# Patient Record
Sex: Male | Born: 1985 | Race: White | Hispanic: No | Smoking: Current every day smoker
Health system: Southern US, Community
[De-identification: ages and names within clinical notes are randomized; demographics above are authoritative.]

## PROBLEM LIST (undated history)

## (undated) DIAGNOSIS — F909 Attention-deficit hyperactivity disorder, unspecified type: Secondary | ICD-10-CM

## (undated) DIAGNOSIS — T7840XA Allergy, unspecified, initial encounter: Secondary | ICD-10-CM

## (undated) DIAGNOSIS — E785 Hyperlipidemia, unspecified: Secondary | ICD-10-CM

## (undated) DIAGNOSIS — R7989 Other specified abnormal findings of blood chemistry: Secondary | ICD-10-CM

## (undated) DIAGNOSIS — Z969 Presence of functional implant, unspecified: Secondary | ICD-10-CM

## (undated) DIAGNOSIS — R0989 Other specified symptoms and signs involving the circulatory and respiratory systems: Secondary | ICD-10-CM

## (undated) HISTORY — DX: Hyperlipidemia, unspecified: E78.5

## (undated) HISTORY — DX: Attention-deficit hyperactivity disorder, unspecified type: F90.9

## (undated) HISTORY — DX: Allergy, unspecified, initial encounter: T78.40XA

## (undated) HISTORY — DX: Other specified abnormal findings of blood chemistry: R79.89

---

## 2010-01-17 HISTORY — PX: WISDOM TOOTH EXTRACTION: SHX21

## 2011-09-01 ENCOUNTER — Emergency Department (HOSPITAL_COMMUNITY)
Admission: EM | Admit: 2011-09-01 | Discharge: 2011-09-01 | Disposition: A | Discharge status: Home / Self Care | Payer: Worker's Compensation | Attending: Emergency Medicine | Admitting: Emergency Medicine

## 2011-09-01 ENCOUNTER — Emergency Department (HOSPITAL_COMMUNITY): Payer: Worker's Compensation

## 2011-09-01 ENCOUNTER — Encounter (HOSPITAL_COMMUNITY): Payer: Self-pay | Admitting: Emergency Medicine

## 2011-09-01 DIAGNOSIS — Z23 Encounter for immunization: Secondary | ICD-10-CM | POA: Insufficient documentation

## 2011-09-01 DIAGNOSIS — W268XXA Contact with other sharp object(s), not elsewhere classified, initial encounter: Secondary | ICD-10-CM | POA: Insufficient documentation

## 2011-09-01 DIAGNOSIS — S61209A Unspecified open wound of unspecified finger without damage to nail, initial encounter: Secondary | ICD-10-CM | POA: Insufficient documentation

## 2011-09-01 DIAGNOSIS — F17200 Nicotine dependence, unspecified, uncomplicated: Secondary | ICD-10-CM | POA: Insufficient documentation

## 2011-09-01 DIAGNOSIS — M795 Residual foreign body in soft tissue: Secondary | ICD-10-CM

## 2011-09-01 MED ORDER — TETANUS-DIPHTH-ACELL PERTUSSIS 5-2.5-18.5 LF-MCG/0.5 IM SUSP
0.5000 mL | Freq: Once | INTRAMUSCULAR | Status: AC
  Administered 2011-09-01: 0.5 mL via INTRAMUSCULAR
  Filled 2011-09-01: qty 0.5

## 2011-09-01 MED ORDER — TRAMADOL HCL 50 MG PO TABS: 50.0000 mg | ORAL_TABLET | Freq: Four times a day (QID) | ORAL | Status: DC | PRN

## 2011-09-01 MED ORDER — CEPHALEXIN 250 MG PO CAPS: 250.0000 mg | ORAL_CAPSULE | Freq: Four times a day (QID) | ORAL | Status: AC

## 2011-09-01 NOTE — ED Provider Notes (Signed)
Medical screening examination/treatment/procedure(s) were conducted as a shared visit with non-physician practitioner(s) and myself.  I personally evaluated the patient during the encounter  Eric Hodge is a 26 y.o. male here with possible foreign body on L middle finger while grinding metal. There is a puncture wound but no obvious foreign palpable. Otherwise nl neurovascular exam of the finger. No other injuries. He was given tetanus in the ED. The PA called Dr. Merlyn Lot, hand surgeon, who recommended splint, abx, and patient will call his office tomorrow for follow up.    Richardean Canal, MD 09/01/11 [phone]

## 2011-09-01 NOTE — Progress Notes (Signed)
Orthopedic Tech Progress Note Patient Details:  Eric Hodge Apr 26, 1985 [id number]  Ortho Devices Type of Ortho Device: Finger splint Ortho Device/Splint Location: (L) UE Ortho Device/Splint Interventions: Application   Jennye Moccasin 09/01/2011, 10:27 PM

## 2011-09-01 NOTE — ED Provider Notes (Signed)
History     CSN: [id number]  Arrival date & time 09/01/11  4098   First MD Initiated Contact with Patient 09/01/11 2014      Chief Complaint  Patient presents with  . Finger Injury    (Consider location/radiation/quality/duration/timing/severity/associated sxs/prior treatment) HPI Hx from pt. Arvin Abello is a 26 y.o. male who presents with possible foreign body to L middle finger. States he was grinding metal while at work this evening when a piece flew off and he thought it may have become lodged in his finger. He noted immediate swelling to the area. He has had no bleeding from the area. Denies distal numbness or weakness; has FROM of the finger.   History reviewed. No pertinent past medical history.  History reviewed. No pertinent past surgical history.  No family history on file.  History  Substance Use Topics  . Smoking status: Current Everyday Smoker  . Smokeless tobacco: Not on file  . Alcohol Use: Yes      Review of Systems  Allergies  Review of patient's allergies indicates no known allergies.  Home Medications  No current outpatient prescriptions on file.  BP 150/61  Pulse 64  Temp 98.2 F (36.8 C) (Oral)  Resp 14  SpO2 100%  Physical Exam  Nursing note and vitals reviewed. Constitutional: He appears well-developed and well-nourished. No distress.  HENT:  Head: Normocephalic and atraumatic.  Neck: Normal range of motion.  Cardiovascular: Normal rate.   Pulmonary/Chest: Effort normal.  Musculoskeletal: Normal range of motion.       Small superficial appearing puncture wound to the medial dorsum L middle finger just distal to the PIP jt. No palpable foreign body. No bleeding from the wound. NVI distally, FROM of the finger at all jts. Cap refill <2.  Neurological: He is alert.  Skin: Skin is warm and dry. He is not diaphoretic.  Psychiatric: He has a normal mood and affect.    ED Course  Procedures (including critical care time)  Labs  Reviewed - No data to display Dg Finger Middle Left  09/01/2011  *RADIOLOGY REPORT*  Clinical Data: Blunt trauma  LEFT MIDDLE FINGER 2+V  Comparison: None.  Findings: There is a 3 mm linear metallic foreign body adjacent to the dorsal cortex of the proximal shaft of the middle phalanx. Negative for fracture, dislocation, or other acute bony abnormality.  No significant osseous degenerative change.  IMPRESSION:  1.  Small linear metallic foreign body dorsal to the middle phalanx.  Original Report Authenticated By: Osa Craver, M.D.   I personally reviewed the plain films  No diagnosis found.    MDM  10:07 PM Pt presents with ?FB to finger after grinding metal today. Has small puncture wound to medial dorsum of finger; FB by plain films is somewhere on radial side of dorsal finger. Discussed with Dr. Merlyn Lot on call for hand. States that these sometimes require debridement in the OR for removal. He recs placing pt in splint, on abx, and have him call the office for f/u tomorrow. Pt agreeable with plan.       Grant Fontana, PA-C 09/01/11 2212

## 2011-09-01 NOTE — ED Notes (Signed)
PT. REPORTS "METAL " SPLINTER AT LEFT MIDDLE FINGER SUSTAINED WHILE WORKING AT A WHEEL AT WORK THIS EVENING . NO BLEEDING AT TRIAGE.

## 2011-09-06 ENCOUNTER — Other Ambulatory Visit: Payer: Self-pay | Admitting: Orthopedic Surgery

## 2011-09-06 ENCOUNTER — Encounter (HOSPITAL_BASED_OUTPATIENT_CLINIC_OR_DEPARTMENT_OTHER): Payer: Self-pay | Admitting: *Deleted

## 2011-09-08 ENCOUNTER — Encounter (HOSPITAL_BASED_OUTPATIENT_CLINIC_OR_DEPARTMENT_OTHER): Payer: Self-pay | Admitting: *Deleted

## 2011-09-08 ENCOUNTER — Ambulatory Visit (HOSPITAL_BASED_OUTPATIENT_CLINIC_OR_DEPARTMENT_OTHER): Payer: Worker's Compensation | Admitting: *Deleted

## 2011-09-08 ENCOUNTER — Ambulatory Visit (HOSPITAL_BASED_OUTPATIENT_CLINIC_OR_DEPARTMENT_OTHER)
Admission: RE | Admit: 2011-09-08 | Discharge: 2011-09-08 | Disposition: A | Discharge status: Home / Self Care | Payer: Worker's Compensation | Source: Ambulatory Visit | Attending: Orthopedic Surgery | Admitting: Orthopedic Surgery

## 2011-09-08 ENCOUNTER — Encounter (HOSPITAL_BASED_OUTPATIENT_CLINIC_OR_DEPARTMENT_OTHER)
Admission: RE | Disposition: A | Discharge status: Home / Self Care | Payer: Self-pay | Source: Ambulatory Visit | Attending: Orthopedic Surgery

## 2011-09-08 DIAGNOSIS — Y9269 Other specified industrial and construction area as the place of occurrence of the external cause: Secondary | ICD-10-CM | POA: Insufficient documentation

## 2011-09-08 DIAGNOSIS — Y990 Civilian activity done for income or pay: Secondary | ICD-10-CM | POA: Insufficient documentation

## 2011-09-08 DIAGNOSIS — Z1811 Retained magnetic metal fragments: Secondary | ICD-10-CM | POA: Insufficient documentation

## 2011-09-08 DIAGNOSIS — IMO0002 Reserved for concepts with insufficient information to code with codable children: Secondary | ICD-10-CM | POA: Insufficient documentation

## 2011-09-08 DIAGNOSIS — S61209A Unspecified open wound of unspecified finger without damage to nail, initial encounter: Secondary | ICD-10-CM | POA: Insufficient documentation

## 2011-09-08 HISTORY — PX: FOREIGN BODY REMOVAL: SHX962

## 2011-09-08 SURGERY — REMOVAL FOREIGN BODY EXTREMITY
Anesthesia: General | Site: Finger | Laterality: Left | Wound class: Clean

## 2011-09-08 MED ORDER — LACTATED RINGERS IV SOLN
INTRAVENOUS | Status: DC
  Administered 2011-09-08: 09:00:00 via INTRAVENOUS

## 2011-09-08 MED ORDER — FENTANYL CITRATE 0.05 MG/ML IJ SOLN
INTRAMUSCULAR | Status: DC | PRN
  Administered 2011-09-08: 100 ug via INTRAVENOUS

## 2011-09-08 MED ORDER — DEXAMETHASONE SODIUM PHOSPHATE 10 MG/ML IJ SOLN
INTRAMUSCULAR | Status: DC | PRN
  Administered 2011-09-08: 10 mg via INTRAVENOUS

## 2011-09-08 MED ORDER — CEFAZOLIN SODIUM-DEXTROSE 2-3 GM-% IV SOLR
2.0000 g | Freq: Once | INTRAVENOUS | Status: AC
Start: 2011-09-08 — End: 2011-09-08
  Administered 2011-09-08: 2 g via INTRAVENOUS

## 2011-09-08 MED ORDER — MIDAZOLAM HCL 5 MG/5ML IJ SOLN
INTRAMUSCULAR | Status: DC | PRN
  Administered 2011-09-08: 1 mg via INTRAVENOUS

## 2011-09-08 MED ORDER — BUPIVACAINE HCL (PF) 0.25 % IJ SOLN
INTRAMUSCULAR | Status: DC | PRN
  Administered 2011-09-08: 9 mL

## 2011-09-08 MED ORDER — ONDANSETRON HCL 4 MG/2ML IJ SOLN
INTRAMUSCULAR | Status: DC | PRN
  Administered 2011-09-08: 4 mg via INTRAVENOUS

## 2011-09-08 MED ORDER — PROPOFOL 10 MG/ML IV EMUL
INTRAVENOUS | Status: DC | PRN
  Administered 2011-09-08: 30 mg via INTRAVENOUS
  Administered 2011-09-08: 200 mg via INTRAVENOUS

## 2011-09-08 MED ORDER — HYDROCODONE-ACETAMINOPHEN 5-325 MG PO TABS: ORAL_TABLET | ORAL | Status: DC

## 2011-09-08 MED ORDER — LIDOCAINE HCL (CARDIAC) 20 MG/ML IV SOLN
INTRAVENOUS | Status: DC | PRN
  Administered 2011-09-08: 100 mg via INTRAVENOUS

## 2011-09-08 SURGICAL SUPPLY — 41 items
BANDAGE ELASTIC 3 VELCRO ST LF (GAUZE/BANDAGES/DRESSINGS) IMPLANT
BANDAGE GAUZE ELAST BULKY 4 IN (GAUZE/BANDAGES/DRESSINGS) IMPLANT
BLADE MINI RND TIP GREEN BEAV (BLADE) IMPLANT
BLADE SURG 15 STRL LF DISP TIS (BLADE) ×2 IMPLANT
BLADE SURG 15 STRL SS (BLADE) ×2
BNDG COHESIVE 1X5 TAN STRL LF (GAUZE/BANDAGES/DRESSINGS) ×2 IMPLANT
BNDG ELASTIC 2 VLCR STRL LF (GAUZE/BANDAGES/DRESSINGS) IMPLANT
BNDG ESMARK 4X9 LF (GAUZE/BANDAGES/DRESSINGS) ×2 IMPLANT
CHLORAPREP W/TINT 26ML (MISCELLANEOUS) ×2 IMPLANT
CLOTH BEACON ORANGE TIMEOUT ST (SAFETY) ×2 IMPLANT
CORDS BIPOLAR (ELECTRODE) IMPLANT
COVER MAYO STAND STRL (DRAPES) ×2 IMPLANT
COVER TABLE BACK 60X90 (DRAPES) ×2 IMPLANT
CUFF TOURNIQUET SINGLE 18IN (TOURNIQUET CUFF) ×2 IMPLANT
DRAPE EXTREMITY T 121X128X90 (DRAPE) ×2 IMPLANT
DRAPE SURG 17X23 STRL (DRAPES) ×2 IMPLANT
GAUZE XEROFORM 1X8 LF (GAUZE/BANDAGES/DRESSINGS) ×2 IMPLANT
GLOVE BIO SURGEON STRL SZ 6.5 (GLOVE) ×2 IMPLANT
GLOVE BIO SURGEON STRL SZ7.5 (GLOVE) ×2 IMPLANT
GOWN PREVENTION PLUS XLARGE (GOWN DISPOSABLE) ×2 IMPLANT
GOWN STRL REIN XL XLG (GOWN DISPOSABLE) ×4 IMPLANT
NEEDLE FILTER BLUNT 18X 1/2SAF (NEEDLE)
NEEDLE FILTER BLUNT 18X1 1/2 (NEEDLE) IMPLANT
NEEDLE HYPO 25X1 1.5 SAFETY (NEEDLE) ×2 IMPLANT
NS IRRIG 1000ML POUR BTL (IV SOLUTION) ×2 IMPLANT
PACK BASIN DAY SURGERY FS (CUSTOM PROCEDURE TRAY) ×2 IMPLANT
PAD CAST 3X4 CTTN HI CHSV (CAST SUPPLIES) IMPLANT
PADDING CAST ABS 4INX4YD NS (CAST SUPPLIES) ×1
PADDING CAST ABS COTTON 4X4 ST (CAST SUPPLIES) ×1 IMPLANT
PADDING CAST COTTON 3X4 STRL (CAST SUPPLIES)
SPONGE GAUZE 4X4 12PLY (GAUZE/BANDAGES/DRESSINGS) ×2 IMPLANT
STOCKINETTE 4X48 STRL (DRAPES) ×2 IMPLANT
SUT ETHILON 3 0 PS 1 (SUTURE) IMPLANT
SUT ETHILON 4 0 PS 2 18 (SUTURE) ×2 IMPLANT
SWAB CULTURE LIQ STUART DBL (MISCELLANEOUS) IMPLANT
SYR BULB 3OZ (MISCELLANEOUS) ×2 IMPLANT
SYR CONTROL 10ML LL (SYRINGE) ×2 IMPLANT
TOWEL OR 17X24 6PK STRL BLUE (TOWEL DISPOSABLE) ×4 IMPLANT
TUBE ANAEROBIC SPECIMEN COL (MISCELLANEOUS) IMPLANT
UNDERPAD 30X30 INCONTINENT (UNDERPADS AND DIAPERS) ×2 IMPLANT
WATER STERILE IRR 1000ML POUR (IV SOLUTION) ×2 IMPLANT

## 2011-09-08 NOTE — Transfer of Care (Signed)
Immediate Anesthesia Transfer of Care Note  Patient: Eric Hodge  Procedure(s) Performed: Procedure(s) (LRB): REMOVAL FOREIGN BODY EXTREMITY (Left)  Patient Location: PACU  Anesthesia Type: General  Level of Consciousness: awake, alert  and oriented  Airway & Oxygen Therapy: Patient Spontanous Breathing and Patient connected to face mask oxygen  Post-op Assessment: Report given to PACU RN, Post -op Vital signs reviewed and stable and Patient moving all extremities  Post vital signs: Reviewed and stable  Complications: No apparent anesthesia complications

## 2011-09-08 NOTE — Anesthesia Procedure Notes (Signed)
Procedure Name: LMA Insertion Date/Time: 09/08/2011 11:00 AM Performed by: Meyer Russel Pre-anesthesia Checklist: Patient identified, Emergency Drugs available, Suction available and Patient being monitored Patient Re-evaluated:Patient Re-evaluated prior to inductionOxygen Delivery Method: Circle System Utilized Preoxygenation: Pre-oxygenation with 100% oxygen Intubation Type: IV induction Ventilation: Mask ventilation without difficulty LMA: LMA inserted LMA Size: 5.0 Number of attempts: 1 Airway Equipment and Method: bite block Placement Confirmation: positive ETCO2 and breath sounds checked- equal and bilateral Tube secured with: Tape Dental Injury: Teeth and Oropharynx as per pre-operative assessment

## 2011-09-08 NOTE — H&P (Signed)
  Eric Hodge is an 26 y.o. male.   Chief Complaint: foreign body left long finger HPI: 26 yo rhd male states on 09/01/11 while hitting wheel hub with a hammer a piece of metal shot off into left long finger.  Small wound.  Seen at [facility] where xr revealed radioopaque foreign body.  Reports no previous injury to finger and no other injury at this time.  Past Medical History  Diagnosis Date  . Foreign body of finger of left hand 09/01/2011    left long finger    Past Surgical History  Procedure Date  . Wisdom tooth extraction     History reviewed. No pertinent family history. Social History:  reports that he has been smoking Cigarettes.  He has a 1.5 pack-year smoking history. He has never used smokeless tobacco. He reports that he drinks alcohol. He reports that he does not use illicit drugs.  Allergies: No Known Allergies  Medications Prior to Admission  Medication Sig Dispense Refill  . cephALEXin (KEFLEX) 250 MG capsule Take 1 capsule (250 mg total) by mouth 4 (four) times daily.  28 capsule  0  . traMADol (ULTRAM) 50 MG tablet Take 1 tablet (50 mg total) by mouth every 6 (six) hours as needed for pain.  15 tablet  0    No results found for this or any previous visit (from the past 48 hour(s)).  No results found.   A comprehensive review of systems was negative.  Blood pressure 112/76, pulse 65, temperature 97.4 F (36.3 C), temperature source Oral, resp. rate 16, height 5\' 9"  (1.753 m), weight 110.224 kg (243 lb), SpO2 98.00%.  General appearance: alert, cooperative and appears stated age Head: Normocephalic, without obvious abnormality, atraumatic Neck: supple, symmetrical, trachea midline Resp: clear to auscultation bilaterally Cardio: regular rate and rhythm GI: soft, non-tender; bowel sounds normal; no masses,  no organomegaly Extremities: light touch sesnation and capillary refill intact all digits. +epl/fpl/io.  no swelling, no erythema. Pulses: 2+ and  symmetric Skin: Skin color, texture, turgor normal. No rashes or lesions Neurologic: Grossly normal Incision/Wound: Small healed wound dorsum of long finger  Assessment/Plan Left long finger foreign body.  Discussed non operative and operative treatment options with patient.  He wishes to have the foreign body removed.  Risks, benefits, and alternatives of surgery were discussed and the patient agrees with the plan of care.   Essa Malachi R 09/08/2011, 8:51 AM

## 2011-09-08 NOTE — Anesthesia Preprocedure Evaluation (Addendum)
Anesthesia Evaluation  Patient identified by MRN, date of birth, ID band Patient awake    Reviewed: Allergy & Precautions, H&P , NPO status , Patient's Chart, lab work & pertinent test results  Airway       Dental   Pulmonary Current Smoker,          Cardiovascular negative cardio ROS      Neuro/Psych    GI/Hepatic Neg liver ROS,   Endo/Other  negative endocrine ROS  Renal/GU negative Renal ROS     Musculoskeletal   Abdominal   Peds  Hematology negative hematology ROS (+)   Anesthesia Other Findings   Reproductive/Obstetrics                           Anesthesia Physical Anesthesia Plan  ASA: II  Anesthesia Plan: General LMA   Post-op Pain Management:    Induction:   Airway Management Planned:   Additional Equipment:   Intra-op Plan:   Post-operative Plan:   Informed Consent: I have reviewed the patients History and Physical, chart, labs and discussed the procedure including the risks, benefits and alternatives for the proposed anesthesia with the patient or authorized representative who has indicated his/her understanding and acceptance.   Dental Advisory Given  Plan Discussed with: Anesthesiologist, CRNA and Surgeon  Anesthesia Plan Comments:         Anesthesia Quick Evaluation

## 2011-09-08 NOTE — Anesthesia Postprocedure Evaluation (Signed)
Anesthesia Post Note  Patient: Eric Hodge  Procedure(s) Performed: Procedure(s) (LRB): REMOVAL FOREIGN BODY EXTREMITY (Left)  Anesthesia type: general  Patient location: PACU  Post pain: Pain level controlled  Post assessment: Patient's Cardiovascular Status Stable  Last Vitals:  Filed Vitals:   09/08/11 1200  BP: 106/58  Pulse: 66  Temp:   Resp: 16    Post vital signs: Reviewed and stable  Level of consciousness: sedated  Complications: No apparent anesthesia complications

## 2011-09-08 NOTE — Op Note (Signed)
Dictation [id number]

## 2011-09-09 NOTE — Op Note (Signed)
NAMEKIRIN, PASTORINO             ACCOUNT NO.:  [id number]  MEDICAL RECORD NO.:  [id number]  LOCATION:                                 FACILITY:  PHYSICIAN:  Betha Loa, MD        DATE OF BIRTH:  11-21-1985  DATE OF PROCEDURE:  09/08/2011 DATE OF DISCHARGE:                              OPERATIVE REPORT   PREOPERATIVE DIAGNOSIS:  Left long finger foreign body.  POSTOPERATIVE DIAGNOSIS:  Left long finger foreign body.  PROCEDURE:  Removal of foreign body, left long finger.  SURGEON:  Betha Loa, MD  ASSISTANT:  None.  ANESTHESIA:  General.  IV FLUIDS:  Per anesthesia flow sheet.  ESTIMATED BLOOD LOSS:  Minimal.  COMPLICATIONS:  None.  SPECIMENS:  Metallic foreign body, returned to patient.  TOURNIQUET TIME:  Twelve minutes.  DISPOSITION:  Stable to PACU.  INDICATIONS:  Mr. Chiarelli is a 26 year old male who states he was at work when he was sitting in a wheel hub with a hammer and a piece of metal shot off into his left long finger.  Sent to emergency department where radiographs revealed a metallic foreign body.  He was placed on antibiotics and followed up with me in the office.  He wished to have the foreign body removed.  Risks, benefits, and alternatives of surgery were discussed including risk of blood loss, infection, damage to nerves, vessels, tendons, ligaments, bone; failure of procedure, need for additional procedures, complications, wound healing, continued pain, and retained foreign body.  He voiced understanding of these risks and elected to proceed.  OPERATIVE COURSE:  After being identified preoperatively by myself, the patient and I agreed upon procedure and site procedure.  Surgical site was marked.  The risks, benefits, alternatives of surgery were reviewed and wished to proceed.  Surgical consent had been signed.  He was given 2 g of IV Ancef as preoperative antibiotic prophylaxis.  He was transferred to the operating room placed on the  operating room table in supine position with left upper extremity on arm board.  General anesthesia was induced by the anesthesiologist.  Left upper extremity was prepped and draped in normal sterile orthopedic fashion.  Surgical pause was performed between surgeons, anesthesia, and operating staff, and all were in agreement as to the patient, procedure and site procedure.  Tourniquet at the proximal aspect of the extremities inflated to 250 mmHg after exsanguination of the limb with Esmarch bandage.  A longitudinal incision was made on the dorsum of the long finger.  This was carried into subcutaneous tissues by spreading technique.  Bipolar electrocautery was used to obtain hemostasis.  The metallic foreign body was identified in the subcutaneous tissues.  It was removed.  There was some granulation tissue surrounding it which was also removed.  The tendon was inspected and was intact.  The wound was copiously irrigated with sterile saline.  AP and lateral view on the fluoroscopy was taken to ensure complete removal of radiopaque foreign body which was the case.  The wound was closed with 4-0 nylon in horizontal mattress fashion.  A digital block was performed with 9 mL of 0.25% plain Marcaine.  The wound was then dressed with  sterile Xeroform, 4x4, and wrapped with a Kling and Coban dressing lightly.  Tourniquet was deflated at 12 minutes.  The fingertips were pink with brisk capillary refill after deflation of the tourniquet.  Operative drapes were broken down.  The patient was awoken from anesthesia safely.  He was transferred back to stretcher and taken to PACU in stable condition. I will see him back in the office in 1 week for postoperative followup. I will give him Norco 5/325 one to two p.o. q.6 hours p.r.n. pain, dispensed #20.     Betha Loa, MD     KK/MEDQ  D:  09/08/2011  T:  09/09/2011  Job:  [id number]

## 2011-09-13 ENCOUNTER — Encounter (HOSPITAL_BASED_OUTPATIENT_CLINIC_OR_DEPARTMENT_OTHER): Payer: Self-pay | Admitting: Orthopedic Surgery

## 2012-06-06 ENCOUNTER — Emergency Department (HOSPITAL_COMMUNITY)
Admission: EM | Admit: 2012-06-06 | Discharge: 2012-06-07 | Disposition: A | Discharge status: Home / Self Care | Payer: Worker's Compensation | Attending: Emergency Medicine | Admitting: Emergency Medicine

## 2012-06-06 ENCOUNTER — Encounter (HOSPITAL_COMMUNITY): Payer: Self-pay | Admitting: Emergency Medicine

## 2012-06-06 DIAGNOSIS — F17200 Nicotine dependence, unspecified, uncomplicated: Secondary | ICD-10-CM | POA: Insufficient documentation

## 2012-06-06 DIAGNOSIS — Y9289 Other specified places as the place of occurrence of the external cause: Secondary | ICD-10-CM | POA: Insufficient documentation

## 2012-06-06 DIAGNOSIS — Y9389 Activity, other specified: Secondary | ICD-10-CM | POA: Insufficient documentation

## 2012-06-06 DIAGNOSIS — T148XXA Other injury of unspecified body region, initial encounter: Secondary | ICD-10-CM

## 2012-06-06 DIAGNOSIS — Y990 Civilian activity done for income or pay: Secondary | ICD-10-CM | POA: Insufficient documentation

## 2012-06-06 DIAGNOSIS — IMO0002 Reserved for concepts with insufficient information to code with codable children: Secondary | ICD-10-CM | POA: Insufficient documentation

## 2012-06-06 DIAGNOSIS — X500XXA Overexertion from strenuous movement or load, initial encounter: Secondary | ICD-10-CM | POA: Insufficient documentation

## 2012-06-06 NOTE — ED Notes (Signed)
PT. REPORTS BACK INJURY THIS EVENING WHILE AT WORK ( [employer] [place]) , PT. TRYING TO PUSH A WRENCH TO LOOSEN A BOLT FELT SUDDEN PAIN AT MID/UPPER BACK PAIN RADIATING TO BACK OF NECK .

## 2012-06-07 MED ORDER — DIAZEPAM 5 MG PO TABS
5.0000 mg | ORAL_TABLET | Freq: Once | ORAL | Status: AC
  Administered 2012-06-07: 5 mg via ORAL
  Filled 2012-06-07: qty 1

## 2012-06-07 MED ORDER — DIAZEPAM 5 MG PO TABS: 5.0000 mg | ORAL_TABLET | Freq: Two times a day (BID) | ORAL | Status: DC

## 2012-06-07 MED ORDER — KETOROLAC TROMETHAMINE 60 MG/2ML IM SOLN
60.0000 mg | Freq: Once | INTRAMUSCULAR | Status: AC
  Administered 2012-06-07: 60 mg via INTRAMUSCULAR
  Filled 2012-06-07: qty 2

## 2012-06-07 NOTE — ED Notes (Signed)
Pt dc to home with family. Pt sts understanding to dc instructions.  Pt ambulatory to exit without difficulty.  Pt denies need for w/c.

## 2012-06-07 NOTE — ED Provider Notes (Signed)
Medical screening examination/treatment/procedure(s) were performed by non-physician practitioner and as supervising physician I was immediately available for consultation/collaboration.  Kelita Wallis K Lucky Alverson-Rasch, MD 06/07/12 0133 

## 2012-06-07 NOTE — ED Provider Notes (Signed)
History     CSN: [id number]  Arrival date & time 06/06/12  2250   None     Chief Complaint  Patient presents with  . Back Pain    (Consider location/radiation/quality/duration/timing/severity/associated sxs/prior treatment) HPI History provided by pt.   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  Has had pain that radiates up to his neck ever since.  Pain most prominent w/ certain movements.  Non-pleuritic and no associated SOB, extremity weakness/paresthesias or bowel/bladder dysfunction.  Has not taken anything for pain. No PMH.  Past Medical History  Diagnosis Date  . Foreign body of finger of left hand 09/01/2011    left long finger    Past Surgical History  Procedure Laterality Date  . Wisdom tooth extraction    . Foreign body removal  09/08/2011    Procedure: REMOVAL FOREIGN BODY EXTREMITY;  Surgeon: Tami Ribas, MD;  Location: [facility];  Service: Orthopedics;  Laterality: Left;  FOREIGN BODY REMOVAL LEFT LONG FINGER    No family history on file.  History  Substance Use Topics  . Smoking status: Current Every Day Smoker -- 0.50 packs/day for 3 years    Types: Cigarettes  . Smokeless tobacco: Never Used  . Alcohol Use: Yes     Comment: 4-5 drinks/week      Review of Systems  All other systems reviewed and are negative.    Allergies  Review of patient's allergies indicates no known allergies.  Home Medications   Current Outpatient Rx  Name  Route  Sig  Dispense  Refill  . diazepam (VALIUM) 5 MG tablet   Oral   Take 1 tablet (5 mg total) by mouth 2 (two) times daily.   12 tablet   0     BP 156/74  Pulse 87  Temp(Src) 98.7 F (37.1 C) (Oral)  Resp 14  SpO2 97%  Physical Exam  Nursing note and vitals reviewed. Constitutional: He is oriented to person, place, and time. He appears well-developed and well-nourished.  HENT:  Head: Normocephalic and atraumatic.  Eyes:   Normal appearance  Neck: Normal range of motion.  Cardiovascular: Normal rate and regular rhythm.   Pulmonary/Chest: Effort normal and breath sounds normal.  Genitourinary:  No CVA ttp  Musculoskeletal:  Pt points to pain in right upper back.  Mild tenderness at R trap only.  Limited ROM neck d/t pain. Full active ROM and 5/5 strength of all four extremities.  Nml patellar reflexes.  No saddle anesthesia. Distal sensation intact.  2+ radial and DP pulses.  Ambulates w/out diffulty.   Neurological: He is alert and oriented to person, place, and time.  Skin: Skin is warm and dry. No rash noted.  Psychiatric: He has a normal mood and affect. His behavior is normal.    ED Course  Procedures (including critical care time)  Labs Reviewed - No data to display No results found.   1. Muscle strain       MDM  Healthy 26yo M presents w/ upper back pain that started while removing a bolt w/ wrench at work.  Aggravated by movement.  No associated sx.   Afebrile, NAD, no pleuritic pain, no mid-line tenderness, NV all four extremities intact and ambulatory on exam.  Most likely has muscle strain.  Treated w/ 60mg  IM toradol and po valium.  Prescribed valium and recommended NSAID, rest and heat/ice as well.  Referred to NS for persistent sx.  Return precautions discussed.  1:18 AM       Otilio Miu, PA-C 06/07/12 [phone]

## 2013-11-07 ENCOUNTER — Encounter: Payer: Self-pay | Admitting: Family Medicine

## 2013-11-26 ENCOUNTER — Encounter: Payer: Self-pay | Admitting: *Deleted

## 2013-11-26 ENCOUNTER — Ambulatory Visit (INDEPENDENT_AMBULATORY_CARE_PROVIDER_SITE_OTHER): Admitting: Physician Assistant

## 2013-11-26 ENCOUNTER — Encounter: Payer: Self-pay | Admitting: Physician Assistant

## 2013-11-26 VITALS — BP 110/76 | HR 80 | Temp 97.8°F | Resp 18 | Ht 70.0 in | Wt 252.0 lb

## 2013-11-26 DIAGNOSIS — Z0000 Encounter for general adult medical examination without abnormal findings: Secondary | ICD-10-CM

## 2013-11-26 DIAGNOSIS — M545 Low back pain: Secondary | ICD-10-CM

## 2013-11-26 DIAGNOSIS — G8929 Other chronic pain: Secondary | ICD-10-CM

## 2013-11-26 DIAGNOSIS — F909 Attention-deficit hyperactivity disorder, unspecified type: Secondary | ICD-10-CM

## 2013-11-26 DIAGNOSIS — Z720 Tobacco use: Secondary | ICD-10-CM

## 2013-11-26 DIAGNOSIS — M25562 Pain in left knee: Secondary | ICD-10-CM

## 2013-11-26 DIAGNOSIS — F17200 Nicotine dependence, unspecified, uncomplicated: Secondary | ICD-10-CM | POA: Insufficient documentation

## 2013-11-26 DIAGNOSIS — L720 Epidermal cyst: Secondary | ICD-10-CM

## 2013-11-26 DIAGNOSIS — F988 Other specified behavioral and emotional disorders with onset usually occurring in childhood and adolescence: Secondary | ICD-10-CM | POA: Insufficient documentation

## 2013-11-26 DIAGNOSIS — M25512 Pain in left shoulder: Secondary | ICD-10-CM

## 2013-11-26 LAB — CBC WITH DIFFERENTIAL/PLATELET
BASOS PCT: 0 % (ref 0–1)
Basophils Absolute: 0 10*3/uL (ref 0.0–0.1)
EOS ABS: 0.2 10*3/uL (ref 0.0–0.7)
Eosinophils Relative: 2 % (ref 0–5)
HCT: 45.8 % (ref 39.0–52.0)
HEMOGLOBIN: 16.6 g/dL (ref 13.0–17.0)
Lymphocytes Relative: 17 % (ref 12–46)
Lymphs Abs: 2.1 10*3/uL (ref 0.7–4.0)
MCH: 32.8 pg (ref 26.0–34.0)
MCHC: 36.2 g/dL — AB (ref 30.0–36.0)
MCV: 90.5 fL (ref 78.0–100.0)
MONOS PCT: 8 % (ref 3–12)
Monocytes Absolute: 1 10*3/uL (ref 0.1–1.0)
NEUTROS PCT: 73 % (ref 43–77)
Neutro Abs: 8.8 10*3/uL — ABNORMAL HIGH (ref 1.7–7.7)
Platelets: 214 10*3/uL (ref 150–400)
RBC: 5.06 MIL/uL (ref 4.22–5.81)
RDW: 13.5 % (ref 11.5–15.5)
WBC: 12.1 10*3/uL — ABNORMAL HIGH (ref 4.0–10.5)

## 2013-11-26 LAB — LIPID PANEL
CHOL/HDL RATIO: 5.7 ratio
CHOLESTEROL: 221 mg/dL — AB (ref 0–200)
HDL: 39 mg/dL — AB (ref >39)
LDL Cholesterol: 152 mg/dL — ABNORMAL HIGH (ref 0–99)
Triglycerides: 151 mg/dL — ABNORMAL HIGH (ref <150)
VLDL: 30 mg/dL (ref 0–40)

## 2013-11-26 LAB — COMPLETE METABOLIC PANEL WITH GFR
ALK PHOS: 84 U/L (ref 39–117)
ALT: 24 U/L (ref 0–53)
AST: 16 U/L (ref 0–37)
Albumin: 4.4 g/dL (ref 3.5–5.2)
BILIRUBIN TOTAL: 0.6 mg/dL (ref 0.2–1.2)
BUN: 16 mg/dL (ref 6–23)
CO2: 25 mEq/L (ref 19–32)
Calcium: 9.5 mg/dL (ref 8.4–10.5)
Chloride: 104 mEq/L (ref 96–112)
Creat: 1.1 mg/dL (ref 0.50–1.35)
GFR, Est African American: >89 mL/min
GLUCOSE: 96 mg/dL (ref 70–99)
Potassium: 4.3 mEq/L (ref 3.5–5.3)
SODIUM: 138 meq/L (ref 135–145)
TOTAL PROTEIN: 7 g/dL (ref 6.0–8.3)

## 2013-11-26 LAB — TSH: TSH: 2.228 u[IU]/mL (ref 0.350–4.500)

## 2013-11-26 MED ORDER — LISDEXAMFETAMINE DIMESYLATE 60 MG PO CAPS: 60.0000 mg | ORAL_CAPSULE | ORAL | Status: DC

## 2013-11-26 NOTE — Progress Notes (Signed)
Patient ID: Eric Hodge MRN: [id number], DOB: 1985/04/05 28 y.o. Date of Encounter: 11/26/2013, 10:19 AM    Chief Complaint: Physical (CPE)  HPI: 28 y.o. y/o white male here for CPE.  He is also being seen as a new patient to establish care here.  He says that he goes to the [facility] regarding his low back pain, left knee pain, left shoulder pain. He says that they prescribe his current medications which are diclofenac and hydrocodone. Asked if he knows exactly what is wrong with any of those areas of his body but he does not tell no any specific diagnoses. He says that he thinks something is torn in the left shoulder. Says that they wanted to do surgery to the left shoulder and the left knee but has not had the surgery because he cannot take that amount of time out of work. Says that he has had injection to the left shoulder and the left knee with little benefit. Says that he is scheduled to start physical [place] in [place]. Has appointment with them next month.  Otherwise he has never had a PCP and has had no other medical care.  He did want to discuss one thing today and that is possible ADD. 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  Says that he has problems focusing and concentrating. Has hard time completing a task. Instead he will start multiple task without completing them.  Says that he was never diagnosed with this in school and was never treated for ADD. Says that he never really got good grades but does not know whether that was secondary to ADD or not.   No other concerns today.   Review of Systems: Consitutional: No fever, chills, fatigue, night sweats, lymphadenopathy, or weight changes. Eyes: No visual changes, eye redness, or discharge. ENT/Mouth: Ears: No otalgia, tinnitus, hearing loss, discharge. Nose: No congestion, rhinorrhea, sinus pain, or  epistaxis. Throat: No sore throat, post nasal drip, or teeth pain. Cardiovascular: No CP, palpitations, diaphoresis, DOE, edema, orthopnea, PND. Respiratory: No cough, hemoptysis, SOB, or wheezing. Gastrointestinal: No anorexia, dysphagia, reflux, pain, nausea, vomiting, hematemesis, diarrhea, constipation, BRBPR, or melena. Genitourinary: No dysuria, frequency, urgency, hematuria, incontinence, nocturia, decreased urinary stream, discharge, impotence, or testicular pain/masses. Musculoskeletal: No decreased ROM, myalgias, stiffness, joint swelling, or weakness. Skin: No rash, erythema, lesion changes, pain, warmth, jaundice, or pruritis. Neurological: No headache, dizziness, syncope, seizures, tremors, memory loss, coordination problems, or paresthesias. Psychological: No anxiety, depression, hallucinations, SI/HI. Endocrine: No fatigue, polydipsia, polyphagia, polyuria, or known diabetes. All other systems were reviewed and are otherwise negative.  Past Medical History  Diagnosis Date  . Foreign body of finger of left hand 09/01/2011    left long finger     Past Surgical History  Procedure Laterality Date  . Wisdom tooth extraction    . Foreign body removal  09/08/2011    Procedure: REMOVAL FOREIGN BODY EXTREMITY;  Surgeon: Tami RibasKevin R Kuzma, MD;  Location: [facility];  Service: Orthopedics;  Laterality: Left;  FOREIGN BODY REMOVAL LEFT LONG FINGER    Home Meds:  Outpatient Prescriptions Prior to Visit  Medication Sig Dispense Refill  . diclofenac (VOLTAREN) 75 MG EC tablet Take 75 mg by mouth as needed. Knee pain    . HYDROcodone-acetaminophen (NORCO) 10-325 MG per tablet Take 1 tablet by mouth every 6 (six) hours as needed (  for knee pain).    . diazepam (VALIUM) 5 MG tablet Take 1 tablet (5 mg total) by mouth 2 (two) times daily. 12 tablet 0   No facility-administered medications prior to visit.    Allergies: No Known Allergies  History   Social History  . Marital  Status: Legally Separated    Spouse Name: N/A    Number of Children: N/A  . Years of Education: N/A   Occupational History  . Not on file.   Social History Main Topics  . Smoking status: Current Every Day Smoker -- 0.50 packs/day for 7 years    Types: Cigarettes  . Smokeless tobacco: Never Used  . Alcohol Use: 4.8 oz/week    8 Cans of beer per week     Comment: 4-5 drinks/week  . Drug Use: No  . Sexual Activity: Not Currently   Other Topics Concern  . Not on file   Social History Narrative   Works as Nurse, children's.   No routine exercise.   In [employer] PT tests--will exercise prior to these      He has a 2 y/o son---currently pt has him Tuesday-Friday but says 'that may change"   Rest of time, child is with his mom--in [place]    Family History  Problem Relation Age of Onset  . Stroke Paternal Grandmother   . Hypertension Paternal Grandmother   . Cancer Paternal Grandfather   . Hyperlipidemia Paternal Grandfather     Physical Exam: Blood pressure 110/76, pulse 80, temperature 97.8 F (36.6 C), temperature source Oral, resp. rate 18, height 5\' 10"  (1.778 m), weight 252 lb (114.306 kg).  General: Well developed, well nourished, WM. Appears in no acute distress. HEENT: Normocephalic, atraumatic. Conjunctiva pink, sclera non-icteric. Pupils 2 mm constricting to 1 mm, round, regular, and equally reactive to light and accomodation. EOMI. Internal auditory canal clear. TMs with good cone of light and without pathology. Nasal mucosa pink. Nares are without discharge. No sinus tenderness. Oral mucosa pink.  Pharynx without exudate.   Neck: Supple. Trachea midline. No thyromegaly. Full ROM. No lymphadenopathy. Lungs: Clear to auscultation bilaterally without wheezes, rales, or rhonchi. Breathing is of normal effort and unlabored. Cardiovascular: RRR with S1 S2. No murmurs, rubs, or gallops. Distal pulses 2+ symmetrically. No carotid or abdominal  bruits. Abdomen: Soft, non-tender, non-distended with normoactive bowel sounds. No hepatosplenomegaly or masses. No rebound/guarding. No CVA tenderness. No hernias. Musculoskeletal: Full range of motion and 5/5 strength throughout.  Skin: Warm and moist without erythema, ecchymosis, wounds, or rash. Neuro: A+Ox3. CN II-XII grossly intact. Moves all extremities spontaneously. Full sensation throughout. Normal gait.  Psych:  Responds to questions appropriately with a normal affect.   Assessment/Plan:  28 y.o. y/o  white male here for CPE  -1. Visit for preventive health examination  A. Screening Labs:  - CBC with Differential - COMPLETE METABOLIC PANEL WITH GFR - Lipid panel - TSH - Vit D  25 hydroxy (rtn osteoporosis monitoring)  B. Screening For Prostate Cancer: No indication to start this prior to age 30.  C. Screening For Colorectal Cancer:  No indication to start this prior to age 78.  D. Immunizations: Flu------ he says that he gets lots of shots from the [employer] and says that he knows he even got to flu shots this year by them!! Eustaquio Boyden says he knows he gets lots of shots from the [employer]. Does not know specifically about the last tetanus. Told him to try  [place] get [place] [facility] so that we can document the dates in epic. Pneumococcal---given that he is a smoker he needs Pneumovax 23.-----[place] get [place] [facility] then will immunize accordingly if needed. Zostavax--not indicated until age 28  2. ADD (attention deficit disorder) - lisdexamfetamine (VYVANSE) 60 MG capsule; Take 1 capsule (60 mg total) by mouth every morning.  Dispense: 30 capsule; Refill: 0 Discussed drug holidays. Discussed possible adverse effects from the medication. Follow-up with me immediately if he is having adverse effects. Otherwise he is to schedule follow-up office visit with me one month from now to follow this up.  3. Smoker I discussed smoking cessation with him today. He  says that he has not given us any thought. Discussed that there are prescription medications, which I can prescribe, to help him to stop smoking.   4. [facility]  5. Chronic knee pain, left Per [facility]  6. Chronic left shoulder pain Per [facility]  Follow-up office visit in one month or sooner if needed.   Signed:   [address]eria Goetsch Beth [place], [place]  11/26/2013 10:19 AM

## 2013-11-27 LAB — VITAMIN D 25 HYDROXY (VIT D DEFICIENCY, FRACTURES): Vit D, 25-Hydroxy: 29 ng/mL — ABNORMAL LOW (ref 30–89)

## 2013-12-09 IMAGING — CR DG FINGER MIDDLE 2+V*L*
3 series · 3 of 3 positions shown · non-contrast
Comparison: None.

CLINICAL DATA: Blunt trauma

LEFT MIDDLE FINGER 2+V

[x finger pa left]
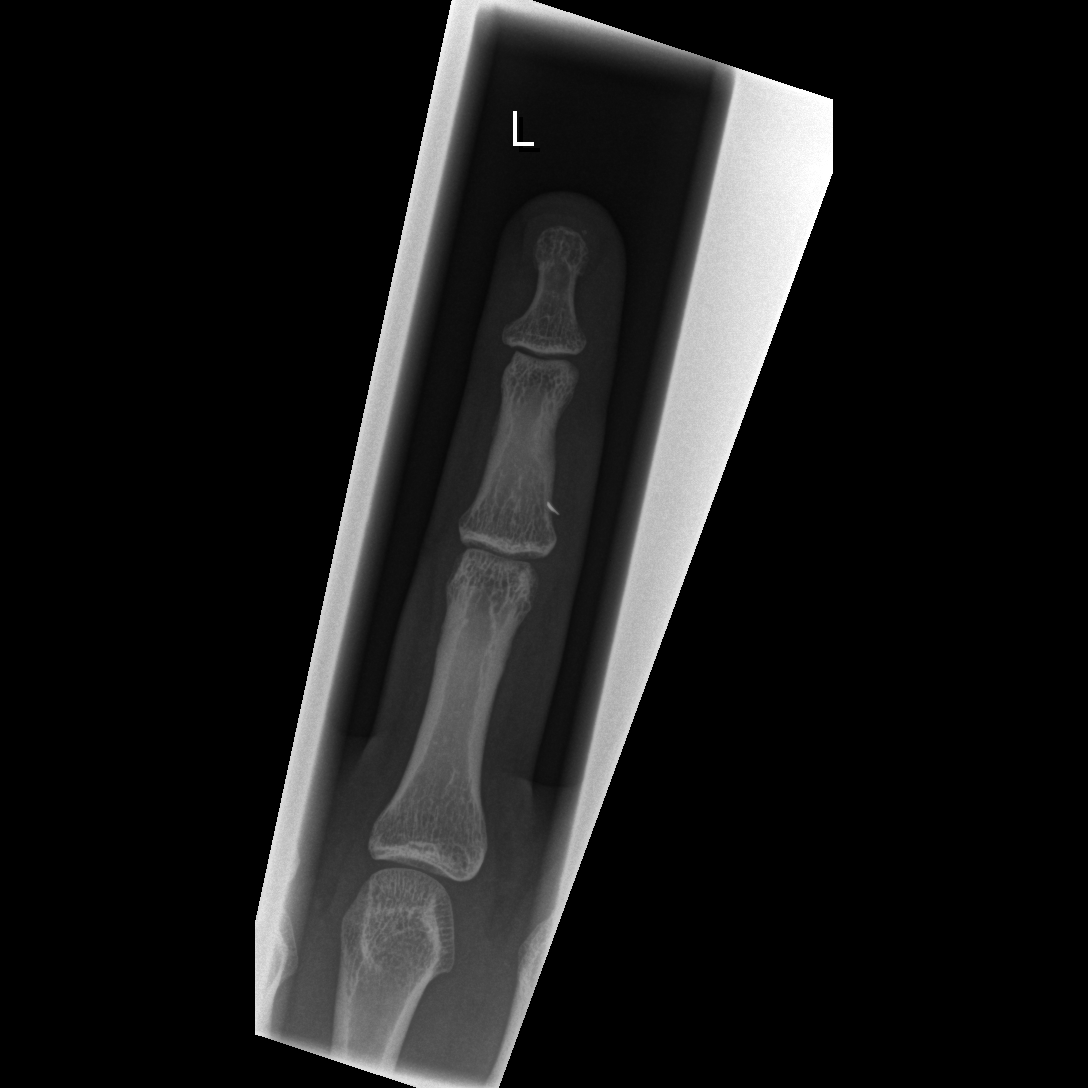

[x finger obl. left]
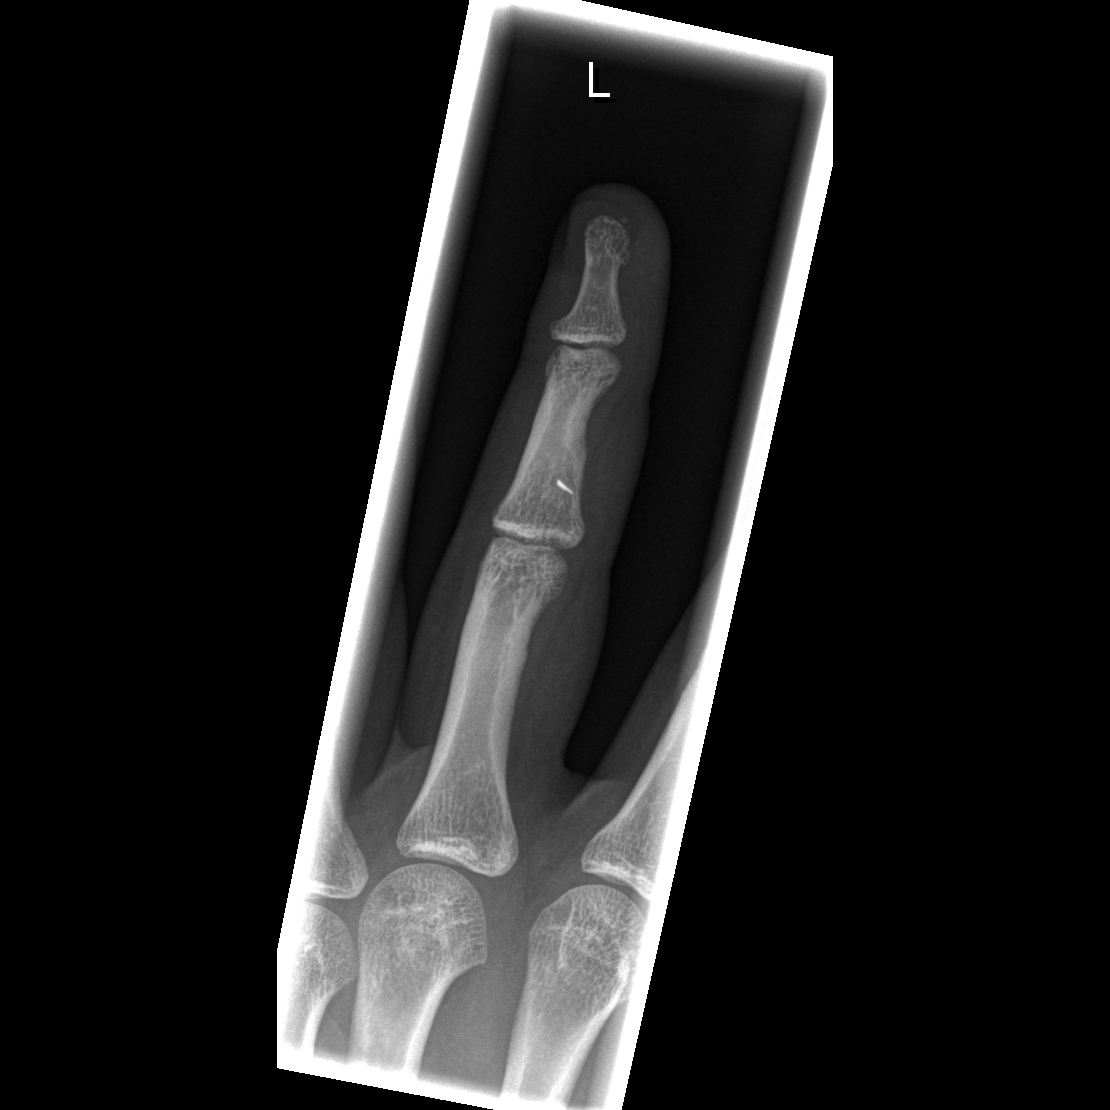

[x finger lateral left]
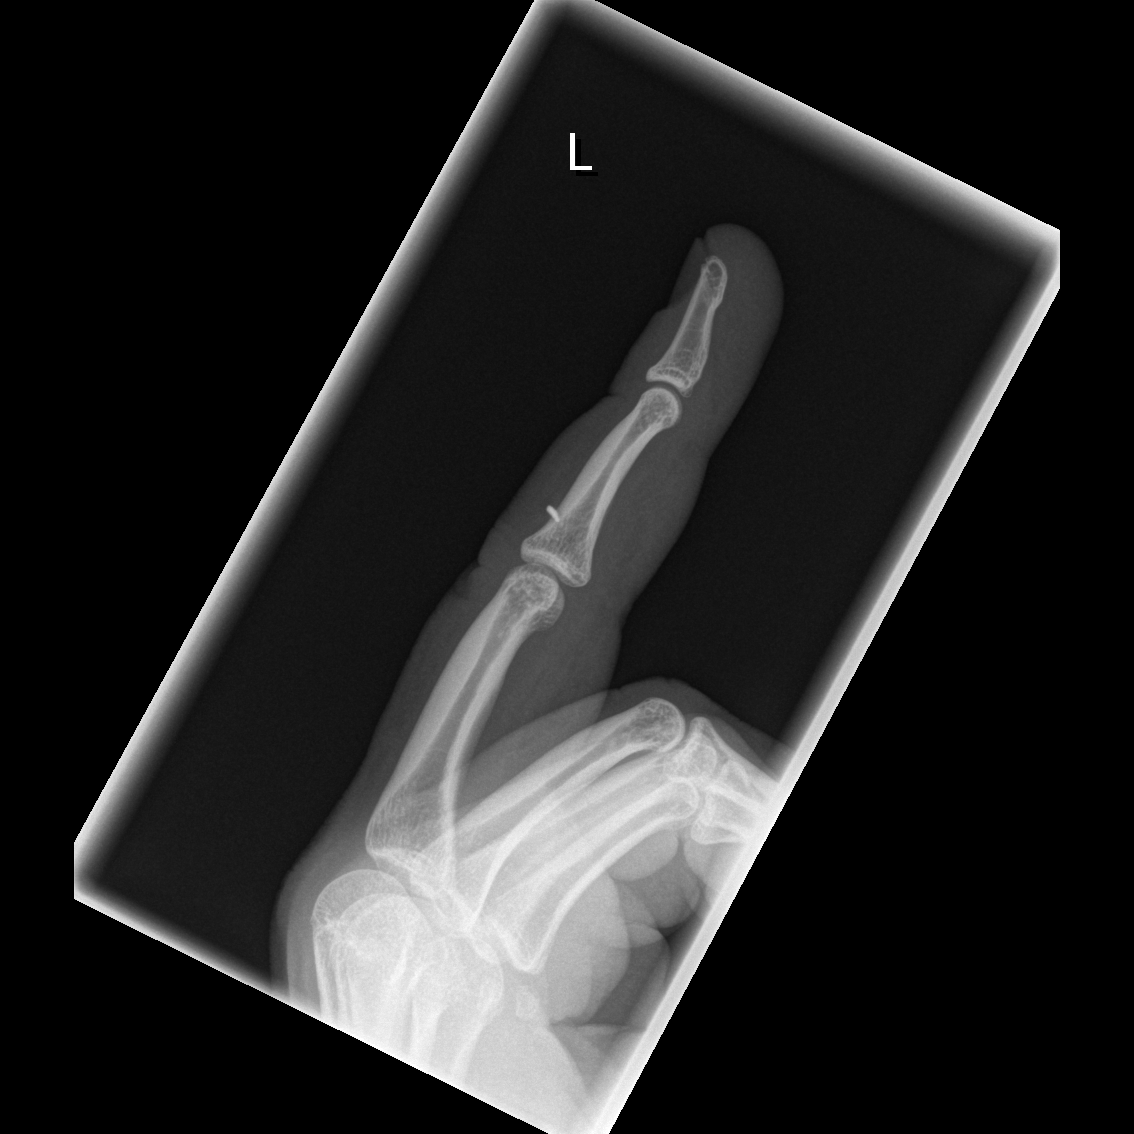

[3 of 3 positions shown; findings below may reference images not displayed]

FINDINGS: There is a 3 mm linear metallic foreign body adjacent to
the dorsal cortex of the proximal shaft of the middle phalanx.
Negative for fracture, dislocation, or other acute bony
abnormality.  No significant osseous degenerative change.
IMPRESSION: 1.  Small linear metallic foreign body dorsal to the middle
phalanx.

## 2013-12-26 ENCOUNTER — Encounter: Payer: Self-pay | Admitting: Physician Assistant

## 2013-12-26 ENCOUNTER — Ambulatory Visit (INDEPENDENT_AMBULATORY_CARE_PROVIDER_SITE_OTHER): Admitting: Physician Assistant

## 2013-12-26 VITALS — BP 110/70 | HR 64 | Temp 97.9°F | Resp 18 | Wt 243.0 lb

## 2013-12-26 DIAGNOSIS — Z720 Tobacco use: Secondary | ICD-10-CM

## 2013-12-26 DIAGNOSIS — F909 Attention-deficit hyperactivity disorder, unspecified type: Secondary | ICD-10-CM

## 2013-12-26 DIAGNOSIS — F988 Other specified behavioral and emotional disorders with onset usually occurring in childhood and adolescence: Secondary | ICD-10-CM

## 2013-12-26 DIAGNOSIS — F17200 Nicotine dependence, unspecified, uncomplicated: Secondary | ICD-10-CM

## 2013-12-26 DIAGNOSIS — E785 Hyperlipidemia, unspecified: Secondary | ICD-10-CM

## 2013-12-26 MED ORDER — LISDEXAMFETAMINE DIMESYLATE 70 MG PO CAPS: 70.0000 mg | ORAL_CAPSULE | Freq: Every day | ORAL | Status: DC

## 2013-12-26 NOTE — Progress Notes (Signed)
Patient ID: Eric Bossierorbert Hodge MRN: [id number], DOB: 1985/06/02 28 y.o. Date of Encounter: 12/26/2013, 10:16 AM    Chief Complaint: F/U ADD.  HPI: 28 y.o. y/o white male here for f/u of ADD.   He was seen by me on 11/26/2013 as a new patient to establish care here and for CPE.   At that 11/26/13 OV he reported:  He says that [facility] his low back pain, left knee pain, left shoulder pain. He says that they prescribe his current medications which are diclofenac and hydrocodone. Asked if he knows exactly what is wrong with any of those areas of his body but he does not tell no any specific diagnoses. He says that he thinks something is torn in the left shoulder. Says that they wanted to do surgery to the left shoulder and the left knee but has not had the surgery because he cannot take that amount of time out of work. Says that he has had injection to the left shoulder and the left knee with little benefit. Says that he is scheduled to start physical therapy in [place]. Has appointment with them next month.  Otherwise he has never had a PCP and has had no other medical care.  He did want to discuss one thing today and that is possible ADD. 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  Says that he has problems focusing and concentrating. Has hard time completing a task. Instead he will start multiple task without completing them.  Says that he was never diagnosed with this in school and was never treated for ADD. Says that he never really got good grades but does not know whether that was secondary to ADD or not.   At the 11/26/13 OV I prescribed Vyvanse 60mg  QD.  He is here for follow-up of this today. Says that the medication has really helped a lot. Says "it is like magic in a bottle".   Says he cannot believe what a difference he noticed with the  medication.  He says that all the medication is effective he can really notice increased focus and being able to pay attention.  Does say that it does take at least an hour to 2 hours for to really start to take effect after he takes it. Also says that it only last about 4 or 5 hours and then he can tell that the effect decreases. He says that the only side effect he has noticed is that it does to plain decreases appetite.  Says that once he realized this was happening he started eating prior to taking the medicine and also,  throughout the day he makes sure to be on a routine basis even if he is not feeling real hungry. He says it is not causing any insomnia headache or abdominal pain and no other adverse effects. Is that he works even on the weekends so he has needed to take it even on the weekends. Says that just yesterday he did not take it and he could tell a huge difference. Says that he was not able to focus and be productive.   Review of Systems: Consitutional: No fever, chills, fatigue, night sweats, lymphadenopathy, or weight changes. Eyes: No visual changes, eye redness, or discharge. ENT/Mouth: Ears: No otalgia, tinnitus, hearing loss, discharge. Nose: No congestion, rhinorrhea, sinus pain, or epistaxis. Throat:  No sore throat, post nasal drip, or teeth pain. Cardiovascular: No CP, palpitations, diaphoresis, DOE, edema, orthopnea, PND. Respiratory: No cough, hemoptysis, SOB, or wheezing. Gastrointestinal: No anorexia, dysphagia, reflux, pain, nausea, vomiting, hematemesis, diarrhea, constipation, BRBPR, or melena. Genitourinary: No dysuria, frequency, urgency, hematuria, incontinence, nocturia, decreased urinary stream, discharge, impotence, or testicular pain/masses. Musculoskeletal: No decreased ROM, myalgias, stiffness, joint swelling, or weakness. Skin: No rash, erythema, lesion changes, pain, warmth, jaundice, or pruritis. Neurological: No headache, dizziness, syncope, seizures,  tremors, memory loss, coordination problems, or paresthesias. Psychological: No anxiety, depression, hallucinations, SI/HI. Endocrine: No fatigue, polydipsia, polyphagia, polyuria, or known diabetes. All other systems were reviewed and are otherwise negative.  Past Medical History  Diagnosis Date  . Foreign body of finger of left hand 09/01/2011    left long finger     Past Surgical History  Procedure Laterality Date  . Wisdom tooth extraction    . Foreign body removal  09/08/2011    Procedure: REMOVAL FOREIGN BODY EXTREMITY;  Surgeon: Tami RibasKevin R Kuzma, MD;  Location: [facility];  Service: Orthopedics;  Laterality: Left;  FOREIGN BODY REMOVAL LEFT LONG FINGER    Home Meds:  Outpatient Prescriptions Prior to Visit  Medication Sig Dispense Refill  . diclofenac (VOLTAREN) 75 MG EC tablet Take 75 mg by mouth as needed. Knee pain    . HYDROcodone-acetaminophen (NORCO) 10-325 MG per tablet Take 1 tablet by mouth every 6 (six) hours as needed (for knee pain).    Marland Kitchen. lisdexamfetamine (VYVANSE) 60 MG capsule Take 1 capsule (60 mg total) by mouth every morning. 30 capsule 0   No facility-administered medications prior to visit.    Allergies: No Known Allergies  History   Social History  . Marital Status: Legally Separated    Spouse Name: N/A    Number of Children: N/A  . Years of Education: N/A   Occupational History  . Not on file.   Social History Main Topics  . Smoking status: Current Every Day Smoker -- 0.50 packs/day for 7 years    Types: Cigarettes  . Smokeless tobacco: Never Used  . Alcohol Use: 4.8 oz/week    8 Cans of beer per week     Comment: 4-5 drinks/week  . Drug Use: No  . Sexual Activity: Not Currently   Other Topics Concern  . 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   No routine exercise.   In [employer]--Has PT tests--will exercise prior to these      He has a 2 y/o son---currently pt has him  Tuesday-Friday but says 'that may change"   Rest of time, child is with his mom--in     Family History  Problem Relation Age of Onset  . Stroke Paternal Grandmother   . Hypertension Paternal Grandmother   . Cancer Paternal Grandfather   . Hyperlipidemia Paternal Grandfather     Physical Exam: Blood pressure 110/70, pulse 64, temperature 97.9 F (36.6 C), temperature source Oral, resp. rate 18, weight 243 lb (110.224 kg).  General: Well developed, well nourished, WM. Appears in no acute distress. Neck: Supple. Trachea midline. No thyromegaly. Full ROM. No lymphadenopathy. Lungs: Clear to auscultation bilaterally without wheezes, rales, or rhonchi. Breathing is of normal effort and unlabored. Cardiovascular: RRR with S1 S2. No murmurs, rubs, or gallops. Musculoskeletal: Full range of motion and 5/5 strength throughout.  Skin: Warm and moist . Neuro: A+Ox3. CN II-XII grossly intact. Moves all  extremities spontaneously. Normal gait.  Psych:  Responds to questions appropriately with a normal affect.   Assessment/Plan:  28 y.o. y/o  white male here for   1. ADD (attention deficit disorder) - lisdexamfetamine (VYVANSE) 70 MG capsule; Take 1 capsule (70 mg total) by mouth daily.  Dispense: 30 capsule; Refill: 0  Will increase the dose to 70 mg so that hopefully it will be effective for a longer duration. I discussed whether he wanted to come back and pick up a prescription each month versus me going ahead and printing 3 at a time. Discussed that if we do present 3 at a time that he has to make sure that the pharmacy can either hold them for him or he needs to make sure he has a safe place where he can keep them and make sure that they do not get lost or stolen. He does want to go ahead and have 3 printed out a time. He understands that he is responsible for making sure that these do not lost. He will call me when he needs another set of prescriptions printed. Will plan for  routine follow-up visit in 6 months or sooner if needed.  2. Smoker The time of his complete physical 11/26/13 I discussed smoking cessation. At that time he said that he had not given it any thought. Informed him that in the future if he did want medication to help him with smoking cessation to let me know. Today he says that he still has not given this much thought and is not ready to quit.  3. Hyperlipidemia We did screening labs at his physical 11/26/13. LDL was 152. He says that he did get a phone call telling him to decrease saturated fats in his diet. He says that he does eat a lot of [employer] and a lot of poor diet. 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 Is that he will try to improve his diet.   THE FOLLOWING IS COPIED FROM HIS CPE NOTE 11/26/2013: -1. Visit for preventive health examination  A. Screening Labs:  - CBC with Differential - COMPLETE METABOLIC PANEL WITH GFR - Lipid panel - TSH - Vit D  25 hydroxy (rtn osteoporosis monitoring)  B. Screening For Prostate Cancer: No indication to start this prior to age 33.  C. Screening For Colorectal Cancer:  No indication to start this prior to age 96.  D. Immunizations: Flu------ he says that he gets lots of shots from the [employer] and says that he knows he even got to flu shots this year by them!! Eustaquio Boyden says he knows he gets lots of shots from the [employer]. Does not know specifically about the last tetanus. Told him to try to get shot record from the [place] so that we can document the dates in epic. Pneumococcal---given that he is a smoker he needs Pneumovax 23.-----Will get shot records from the [place] then will immunize accordingly if needed. Zostavax--not indicated until age 9   4. [facility]  5. Chronic knee pain, left Per [facility]  6. Chronic left shoulder pain Per [facility]  Follow-up office visit in 6 months or sooner if needed.   Signed:   [address] [place], [place]    12/26/2013 10:16 AM

## 2014-03-05 ENCOUNTER — Other Ambulatory Visit: Payer: Self-pay | Admitting: Family Medicine

## 2014-03-05 DIAGNOSIS — F988 Other specified behavioral and emotional disorders with onset usually occurring in childhood and adolescence: Secondary | ICD-10-CM

## 2014-03-05 MED ORDER — LISDEXAMFETAMINE DIMESYLATE 70 MG PO CAPS: 70.0000 mg | ORAL_CAPSULE | Freq: Every day | ORAL | Status: DC

## 2014-03-05 NOTE — Telephone Encounter (Signed)
Pt here with son for OV. Asked provider for refills on his Vyvanse.  Per provider refills done for 03/27/14, 04/27/14 and 05/27/14.

## 2014-03-26 ENCOUNTER — Encounter: Payer: Self-pay | Admitting: Physician Assistant

## 2014-06-30 ENCOUNTER — Ambulatory Visit: Admitting: Physician Assistant

## 2014-07-09 ENCOUNTER — Ambulatory Visit: Admitting: Physician Assistant

## 2014-07-14 ENCOUNTER — Telehealth: Payer: Self-pay | Admitting: Physician Assistant

## 2014-07-14 NOTE — Telephone Encounter (Signed)
[phone] Pt is needing a refill on lisdexamfetamine (VYVANSE) 70 MG capsule

## 2014-07-14 NOTE — Telephone Encounter (Signed)
Pt due for ADD office visit.  Appt made for Wednesday

## 2014-07-16 ENCOUNTER — Encounter: Payer: Self-pay | Admitting: Physician Assistant

## 2014-07-16 ENCOUNTER — Ambulatory Visit (INDEPENDENT_AMBULATORY_CARE_PROVIDER_SITE_OTHER): Admitting: Physician Assistant

## 2014-07-16 VITALS — BP 100/60 | HR 78 | Temp 98.0°F | Resp 14 | Ht 71.0 in | Wt 220.0 lb

## 2014-07-16 DIAGNOSIS — E785 Hyperlipidemia, unspecified: Secondary | ICD-10-CM | POA: Diagnosis not present

## 2014-07-16 DIAGNOSIS — F909 Attention-deficit hyperactivity disorder, unspecified type: Secondary | ICD-10-CM

## 2014-07-16 DIAGNOSIS — F17200 Nicotine dependence, unspecified, uncomplicated: Secondary | ICD-10-CM

## 2014-07-16 DIAGNOSIS — Z720 Tobacco use: Secondary | ICD-10-CM

## 2014-07-16 DIAGNOSIS — F988 Other specified behavioral and emotional disorders with onset usually occurring in childhood and adolescence: Secondary | ICD-10-CM

## 2014-07-16 MED ORDER — LISDEXAMFETAMINE DIMESYLATE 70 MG PO CAPS: 70.0000 mg | ORAL_CAPSULE | Freq: Every day | ORAL | Status: DC

## 2014-07-16 NOTE — Progress Notes (Signed)
Patient ID: Eric Hodge MRN: [id number], DOB: October 24, 1985 29 y.o. Date of Encounter: 07/16/2014, 3:15 PM y.o. Date of Encounter: 07/16/2014, 3:15 PM    Chief Complaint: F/U ADD.  HPI: 29 y.o. y/o white male here for f/u of ADD.   He was seen by me on 11/26/2013 as a new patient to establish care here and for CPE.   At that 11/26/13 OV he reported:  He says that he goes to the [facility] regarding his low back pain, left knee pain, left shoulder pain. He says that they prescribe his current medications which are diclofenac and hydrocodone. Asked if he knows exactly what is wrong with any of those areas of his body but he does not tell no any specific diagnoses. He says that he thinks something is torn in the left shoulder. Says that they wanted to do surgery to the left shoulder and the left knee but has not had the surgery because he cannot take that amount of time out of work. Says that he has had injection to the left shoulder and the left knee with little benefit. Says that he is scheduled to start physical therapy in [place]. Has appointment with them next month.  Otherwise he has never had a PCP and has had no other medical care.  He did want to discuss one thing today and that is possible ADD. 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  Says that he has problems focusing and concentrating. Has hard time completing a task. Instead he will start multiple task without completing them.  Says that he was never diagnosed with this in school and was never treated for ADD. Says that he never really got good grades but does not know whether that was secondary to ADD or not.   At the 11/26/13 OV I prescribed Vyvanse  QD.  He is here for follow-up of this today. Says that the medication has really helped a lot. Says "it is like magic in a bottle".   Says he cannot believe what a difference he noticed with the  medication.  He says that all the medication is effective he can really notice increased focus and being able to pay attention.  Does say that it does take at least an hour to 2 hours for to really start to take effect after he takes it. Also says that it only last about 4 or 5 hours and then he can tell that the effect decreases. He says that the only side effect he has noticed is that it does to plain decreases appetite.  Says that once he realized this was happening he started eating prior to taking the medicine and also,  throughout the day he makes sure to be on a routine basis even if he is not feeling real hungry. He says it is not causing any insomnia headache or abdominal pain and no other adverse effects. Is that he works even on the weekends so he has needed to take it even on the weekends. Says that just yesterday he did not take it and he could tell a huge difference. Says that he was not able to focus and be productive.  At OV 07/16/2014: He again reports the same information as above. In the past we had checked a lipid panel that showed slightly elevated LDL and I told him to decrease saturated fats in his diet. He says that he  has lost weight since then because he has made himself decreased portion sizes. Says that when he first started Vyvanse it definitely decreased his appetite but says that it seems that his body got use to that. As now he feels like he could eat more than he is but he is making himself control his portions. Medication start work working very well and causing no adverse effects.   Review of Systems: Consitutional: No fever, chills, fatigue, night sweats, lymphadenopathy, or weight changes. Eyes: No visual changes, eye redness, or discharge. ENT/Mouth: Ears: No otalgia, tinnitus, hearing loss, discharge. Nose: No congestion, rhinorrhea, sinus pain, or epistaxis. Throat: No sore throat, post nasal drip, or teeth pain. Cardiovascular: No CP, palpitations,  diaphoresis, DOE, edema, orthopnea, PND. Respiratory: No cough, hemoptysis, SOB, or wheezing. Gastrointestinal: No anorexia, dysphagia, reflux, pain, nausea, vomiting, hematemesis, diarrhea, constipation, BRBPR, or melena. Genitourinary: No dysuria, frequency, urgency, hematuria, incontinence, nocturia, decreased urinary stream, discharge, impotence, or testicular pain/masses. Musculoskeletal: No decreased ROM, myalgias, stiffness, joint swelling, or weakness. Skin: No rash, erythema, lesion changes, pain, warmth, jaundice, or pruritis. Neurological: No headache, dizziness, syncope, seizures, tremors, memory loss, coordination problems, or paresthesias. Psychological: No anxiety, depression, hallucinations, SI/HI. Endocrine: No fatigue, polydipsia, polyphagia, polyuria, or known diabetes. All other systems were reviewed and are otherwise negative.  Past Medical History  Diagnosis Date  . Foreign body of finger of left hand 09/01/2011    left long finger     Past Surgical History  Procedure Laterality Date  . Wisdom tooth extraction    . Foreign body removal  09/08/2011    Procedure: REMOVAL FOREIGN BODY EXTREMITY;  Surgeon: Tami RibasKevin R Kuzma, MD;  Location: [facility];  Service: Orthopedics;  Laterality: Left;  FOREIGN BODY REMOVAL LEFT LONG FINGER    Home Meds:  Outpatient Prescriptions Prior to Visit  Medication Sig Dispense Refill  . diclofenac (VOLTAREN) 75 MG EC tablet Take 75 mg by mouth as needed. Knee pain    . HYDROcodone-acetaminophen (NORCO) 10-325 MG per tablet Take 1 tablet by mouth every 6 (six) hours as needed (for knee pain).    Marland Kitchen. lisdexamfetamine (VYVANSE) 70 MG capsule Take 1 capsule (70 mg total) by mouth daily. 30 capsule 0   No facility-administered medications prior to visit.    Allergies: No Known Allergies  History   Social History  . Marital Status: Legally Separated    Spouse Name: N/A  . Number of Children: N/A  . Years of Education: N/A     Occupational History  . Not on file.   Social History Main Topics  . Smoking status: Current Every Day Smoker -- 0.50 packs/day for 7 years    Types: Cigarettes  . Smokeless tobacco: Never Used  . Alcohol Use: 4.8 oz/week    8 Cans of beer per week     Comment: 4-5 drinks/week  . Drug Use: No  . Sexual Activity: Not Currently   Other Topics Concern  . 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   No routine exercise.   [employer]--Has PT tests--will exercise prior to these      He has a 2 y/o son---currently pt has him Tuesday-Friday but says 'that may change"   Rest of time, child is with his mom--in [place]    Family History  Problem Relation Age of Onset  . Stroke Paternal Grandmother   . Hypertension Paternal Grandmother   .  Cancer Paternal Grandfather   . Hyperlipidemia Paternal Grandfather     Physical Exam: Blood pressure 100/60, pulse 78, temperature 98 F (36.7 C), temperature source Oral, resp. rate 14, height 5\' 11"  (1.803 m), weight 220 lb (99.791 kg).  General: Well developed, well nourished, WM. Appears in no acute distress. Neck: Supple. Trachea midline. No thyromegaly. Full ROM. No lymphadenopathy. Lungs: Clear to auscultation bilaterally without wheezes, rales, or rhonchi. Breathing is of normal effort and unlabored. Cardiovascular: RRR with S1 S2. No murmurs, rubs, or gallops. Musculoskeletal: Full range of motion and 5/5 strength throughout.  Skin: Warm and moist . Neuro: A+Ox3. CN II-XII grossly intact. Moves all extremities spontaneously. Normal gait.  Psych:  Responds to questions appropriately with a normal affect.   Assessment/Plan:  29 y.o. y/o  white male here for   1. ADD (attention deficit disorder) - lisdexamfetamine (VYVANSE) 70 MG capsule; Take 1 capsule (70 mg total) by mouth daily.  Dispense: 30 capsule; Refill: 0  3 prescriptions printed--one for now 07/16/14, one for 08/15/14,  one for 09/15/14 In 3 months, he can call us and pick up another set of 3 prescriptions. We'll be due for office visit in 6 months.  2. Smoker The time of his complete physical 11/26/13 I discussed smoking cessation. At that time he said that he had not given it any thought. Informed him that in the future if he did want medication to help him with smoking cessation to let me know. Today he says that he still has not given this much thought and is not ready to quit. Again--07/16/14--again still not ready to quit  3. Hyperlipidemia We did screening labs at his physical 11/26/13. LDL was 152. He says that he did get a phone call telling him to decrease saturated fats in his diet. He says that he does eat a lot of [employer] and a lot of poor diet. 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 Is that he will try to improve his diet. At OV 07/16/2014--view that he has lost quite a bit of weight secondary to portion control. 11/26/13 weight was 252. Today weight 220. He says that even though he may still feel hungry he makes himself stopped eating and control his portions.   THE FOLLOWING IS COPIED FROM HIS CPE NOTE 11/26/2013: -1. Visit for preventive health examination  A. Screening Labs:  - CBC with Differential - COMPLETE METABOLIC PANEL WITH GFR - Lipid panel - TSH - Vit D  25 hydroxy (rtn osteoporosis monitoring)  B. Screening For Prostate Cancer: No indication to start this prior to age 37.  C. Screening For Colorectal Cancer:  No indication to start this prior to age 23.  D. Immunizations: Flu------ he says that he gets lots of shots from the [employer] and says that he knows he even got to flu shots this year by them!! Eustaquio Boyden says he knows he gets lots of shots from the [employer]. Does not know specifically about the last tetanus. Told him to try to get shot record from the [place] so that we can document the dates in epic. Pneumococcal---given that he is a smoker  he needs Pneumovax 23.-----Will get shot records from the [place] then will immunize accordingly if needed. Zostavax--not indicated until age 70   4. [facility]  5. [facility]  6. [facility]  Follow-up office visit in 6 months or sooner if  needed.   Signed:   [address] [place], [place]  07/16/2014 3:15 PM

## 2014-10-29 ENCOUNTER — Other Ambulatory Visit: Payer: Self-pay | Admitting: Family Medicine

## 2014-10-29 DIAGNOSIS — F988 Other specified behavioral and emotional disorders with onset usually occurring in childhood and adolescence: Secondary | ICD-10-CM

## 2014-10-29 MED ORDER — LISDEXAMFETAMINE DIMESYLATE 70 MG PO CAPS: 70.0000 mg | ORAL_CAPSULE | Freq: Every day | ORAL | Status: DC

## 2014-10-29 NOTE — Telephone Encounter (Signed)
Pt has picked up refills 

## 2014-10-29 NOTE — Telephone Encounter (Signed)
Approved. 3 Rxes signed.

## 2014-10-29 NOTE — Telephone Encounter (Signed)
Seen in June.  Ready for his next 3 refills of Vyvanse.  OK

## 2015-03-04 ENCOUNTER — Ambulatory Visit (INDEPENDENT_AMBULATORY_CARE_PROVIDER_SITE_OTHER): Admitting: Physician Assistant

## 2015-03-04 ENCOUNTER — Encounter: Payer: Self-pay | Admitting: Physician Assistant

## 2015-03-04 VITALS — BP 114/64 | HR 72 | Temp 98.4°F | Resp 18 | Wt 237.0 lb

## 2015-03-04 DIAGNOSIS — J309 Allergic rhinitis, unspecified: Secondary | ICD-10-CM

## 2015-03-04 DIAGNOSIS — E785 Hyperlipidemia, unspecified: Secondary | ICD-10-CM

## 2015-03-04 DIAGNOSIS — Z720 Tobacco use: Secondary | ICD-10-CM

## 2015-03-04 DIAGNOSIS — F988 Other specified behavioral and emotional disorders with onset usually occurring in childhood and adolescence: Secondary | ICD-10-CM

## 2015-03-04 DIAGNOSIS — F909 Attention-deficit hyperactivity disorder, unspecified type: Secondary | ICD-10-CM

## 2015-03-04 DIAGNOSIS — F17200 Nicotine dependence, unspecified, uncomplicated: Secondary | ICD-10-CM

## 2015-03-04 MED ORDER — LISDEXAMFETAMINE DIMESYLATE 70 MG PO CAPS: 70.0000 mg | ORAL_CAPSULE | Freq: Every day | ORAL | Status: DC

## 2015-03-04 MED ORDER — CETIRIZINE HCL 10 MG PO TABS: 10.0000 mg | ORAL_TABLET | Freq: Every day | ORAL | Status: DC

## 2015-03-04 NOTE — Progress Notes (Signed)
Patient ID: Eric Hodge MRN: [id number], DOB: Jul 14, 1985 30 y.o. Date of Encounter: 03/04/2015, 9:02 AM    Chief Complaint: F/U ADD.  HPI: 30 y.o. y/o white male here for f/u of ADD.   He was seen by me on 11/26/2013 as a new patient to establish care here and for CPE.   At that 11/26/13 OV he reported:  He says that he goes to the [facility] regarding his low back pain, left knee pain, left shoulder pain. He says that they prescribe his current medications which are diclofenac and hydrocodone. Asked if he knows exactly what is wrong with any of those areas of his body but he does not tell no any specific diagnoses. He says that he thinks something is torn in the left shoulder. Says that they wanted to do surgery to the left shoulder and the left knee but has not had the surgery because he cannot take that amount of time out of work. Says that he has had injection to the left shoulder and the left knee with little benefit. Says that he is scheduled to start physical therapy in [place]. Has appointment with them next month.  Otherwise he has never had a PCP and has had no other medical care.  He did want to discuss one thing today and that is possible ADD. 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  Says that he has problems focusing and concentrating. Has hard time completing a task. Instead he will start multiple task without completing them.  Says that he was never diagnosed with this in school and was never treated for ADD. Says that he never really got good grades but does not know whether that was secondary to ADD or not.   At the 11/26/13 OV I prescribed Vyvanse 60mg  QD.  He is here for follow-up of this today. Says that the medication has really helped a lot. Says "it is like magic in a bottle".   Says he cannot believe what a difference he noticed with the  medication.  He says that all the medication is effective he can really notice increased focus and being able to pay attention.  Does say that it does take at least an hour to 2 hours for to really start to take effect after he takes it. Also says that it only last about 4 or 5 hours and then he can tell that the effect decreases. He says that the only side effect he has noticed is that it does to plain decreases appetite.  Says that once he realized this was happening he started eating prior to taking the medicine and also,  throughout the day he makes sure to be on a routine basis even if he is not feeling real hungry. He says it is not causing any insomnia headache or abdominal pain and no other adverse effects. Is that he works even on the weekends so he has needed to take it even on the weekends. Says that just yesterday he did not take it and he could tell a huge difference. Says that he was not able to focus and be productive.  At OV 07/16/2014: He again reports the same information as above. In the past we had checked a lipid panel that showed slightly elevated LDL and I told him to decrease saturated fats in his diet. He says that he  has lost weight since then because he has made himself decreased portion sizes. Says that when he first started Vyvanse it definitely decreased his appetite but says that it seems that his body got use to that. As now he feels like he could eat more than he is but he is making himself control his portions. Medication start work working very well and causing no adverse effects.  At OV 03/04/2015: He has one son age 36 years old--- he sees me for his well-child checks and visits here. Patient states that he has never had problems with allergies. However recently has noticed that when he goes to his parents house where they have a lot of animals and pets he starts sneezing a lot and his nose itches. Says that he can walk outside and symptoms decrease. Says that he  goes there to drop his son off with them in the morning and then goes back there in the evening and stays longer and at that time the allergy symptoms seem to be worse. Says that during the day while he is at work he has no rhinorrhea or nasal congestion. Therefore does not seem to be an infection but rather allergies. He has taken Benadryl at night sometimes but did not know what to use for treatment. He says that he stopped the Vyvanse 2 or 3 weeks ago because he felt that his body needed a break from it -- but he is ready to resume it now. It was causing no adverse effects. Thought it would be more effective if he went without it for a while and then restarted it. Still does not want medication to help with smoking cessation. Reviewed that his weight is up today but he says is because he has on his steel- toed shoes. Does not think he has gained back much weight. Says that his clothes are still fitting the same as they were at his last visit.   Review of Systems: Consitutional: No fever, chills, fatigue, night sweats, lymphadenopathy, or weight changes. Eyes: No visual changes, eye redness, or discharge. ENT/Mouth: Ears: No otalgia, tinnitus, hearing loss, discharge. Nose: No sinus pain, or epistaxis. Throat: No sore throat, post nasal drip, or teeth pain. Cardiovascular: No CP, palpitations, diaphoresis, DOE, edema, orthopnea, PND. Respiratory: No cough, hemoptysis, SOB, or wheezing. Gastrointestinal: No anorexia, dysphagia, reflux, pain, nausea, vomiting, hematemesis, diarrhea, constipation, BRBPR, or melena. Genitourinary: No dysuria, frequency, urgency, hematuria, incontinence, nocturia, decreased urinary stream, discharge, impotence, or testicular pain/masses. Musculoskeletal: No decreased ROM, myalgias, stiffness, joint swelling, or weakness. Skin: No rash, erythema, lesion changes, pain, warmth, jaundice, or pruritis. Neurological: No headache, dizziness, syncope, seizures, tremors, memory  loss, coordination problems, or paresthesias. Psychological: No anxiety, depression, hallucinations, SI/HI. Endocrine: No fatigue, polydipsia, polyphagia, polyuria, or known diabetes. All other systems were reviewed and are otherwise negative.  Past Medical History  Diagnosis Date  . Foreign body of finger of left hand 09/01/2011    left long finger     Past Surgical History  Procedure Laterality Date  . Wisdom tooth extraction    . Foreign body removal  09/08/2011    Procedure: REMOVAL FOREIGN BODY EXTREMITY;  Surgeon: Tami Ribas, MD;  Location: [facility];  Service: Orthopedics;  Laterality: Left;  FOREIGN BODY REMOVAL LEFT LONG FINGER    Home Meds:  Outpatient Prescriptions Prior to Visit  Medication Sig Dispense Refill  . diclofenac (VOLTAREN) 75 MG EC tablet Take 75 mg by mouth as needed. Knee pain    .  HYDROcodone-acetaminophen (NORCO) 10-325 MG per tablet Take 1 tablet by mouth every 6 (six) hours as needed (for knee pain).    Marland Kitchen lisdexamfetamine (VYVANSE) 70 MG capsule Take 1 capsule (70 mg total) by mouth daily. 30 capsule 0   No facility-administered medications prior to visit.    Allergies: No Known Allergies  Social History   Social History  . Marital Status: Legally Separated    Spouse Name: N/A  . Number of Children: N/A  . Years of Education: N/A   Occupational History  . Not on file.   Social History Main Topics  . Smoking status: Current Every Day Smoker -- 0.50 packs/day for 7 years    Types: Cigarettes  . Smokeless tobacco: Never Used  . Alcohol Use: 4.8 oz/week    8 Cans of beer per week     Comment: 4-5 drinks/week  . Drug Use: No  . Sexual Activity: Not Currently   Other Topics Concern  . Not on file   Social History Narrative   Works as Nurse, children's.   No routine exercise.   In [employer] PT tests--will exercise prior to these      He has a 2 y/o son---currently pt has him Tuesday-Friday  but says 'that may change"   Rest of time, child is with his mom--in [place]    Family History  Problem Relation Age of Onset  . Stroke Paternal Grandmother   . Hypertension Paternal Grandmother   . Cancer Paternal Grandfather   . Hyperlipidemia Paternal Grandfather     Physical Exam: Blood pressure 114/64, pulse 72, temperature 98.4 F (36.9 C), temperature source Oral, resp. rate 18, weight 237 lb (107.502 kg).  General: Well developed, well nourished, WM. Appears in no acute distress. Neck: Supple. Trachea midline. No thyromegaly. Full ROM. No lymphadenopathy. Lungs: Clear to auscultation bilaterally without wheezes, rales, or rhonchi. Breathing is of normal effort and unlabored. Cardiovascular: RRR with S1 S2. No murmurs, rubs, or gallops. Musculoskeletal: Full range of motion and 5/5 strength throughout.  Skin: Warm and moist . Neuro: A+Ox3. CN II-XII grossly intact. Moves all extremities spontaneously. Normal gait.  Psych:  Responds to questions appropriately with a normal affect.   Assessment/Plan:  30 y.o. y/o  white male here for   1. ADD (attention deficit disorder) - lisdexamfetamine (VYVANSE) 70 MG capsule; Take 1 capsule (70 mg total) by mouth daily.  Dispense: 30 capsule; Refill: 0  3 prescriptions printed--one for now, one for 04/01/2015, one for 05/02/2015 In 3 months, he can call us and pick up another set of 3 prescriptions. Will be due for office visit in 6 months.  2. Smoker The time of his complete physical 11/26/13 I discussed smoking cessation. At that time he said that he had not given it any thought. Informed him that in the future if he did want medication to help him with smoking cessation to let me know. Today he says that he still has not given this much thought and is not ready to quit. Again--07/16/14, 03/04/15--again still not ready to quit  3. Hyperlipidemia We did screening labs at his physical 11/26/13. LDL was 152. He says that he did  get a phone call telling him to decrease saturated fats in his diet. He says that he does eat a lot of [employer] and a lot of poor diet. 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 Is that he will try to improve  his diet. At OV 07/16/2014--view that he has lost quite a bit of weight secondary to portion control. 11/26/13 weight was 252. Today weight 220. He says that even though he may still feel hungry he makes himself stopped eating and control his portions.  4. Allergic rhinitis, unspecified allergic rhinitis type Discussed avoiding the animals and pets at his parents house. Also discussed taking Zyrtec daily to control symptoms. - cetirizine (ZYRTEC) 10 MG tablet; Take 1 tablet (10 mg total) by mouth daily.  Dispense: 30 tablet; Refill: 11    THE FOLLOWING IS COPIED FROM HIS CPE NOTE 11/26/2013: -1. Visit for preventive health examination  A. Screening Labs:  - CBC with Differential - COMPLETE METABOLIC PANEL WITH GFR - Lipid panel - TSH - Vit D  25 hydroxy (rtn osteoporosis monitoring)  B. Screening For Prostate Cancer: No indication to start this prior to age 62.  C. Screening For Colorectal Cancer:  No indication to start this prior to age 102.  D. Immunizations: Flu------ he says that he gets lots of shots from the [employer] and says that he knows he even got to flu shots this year by them!! Eustaquio Boyden says he knows he gets lots of shots from the [employer]. Does not know specifically about the last tetanus. Told him to try to get shot record from the [place] so that we can document the dates in epic. Pneumococcal---given that he is a smoker he needs Pneumovax 23.-----Will get shot records from the [place] then will immunize accordingly if needed. Zostavax--not indicated until age 78   4. [facility]  5. [facility] knee [facility]  6. [facility]  Follow-up office visit in 6 months or sooner if  needed.   Signed:   [address] [place], [place]  03/04/2015 9:02 AM

## 2015-06-16 ENCOUNTER — Other Ambulatory Visit: Payer: Self-pay | Admitting: Physician Assistant

## 2015-06-16 DIAGNOSIS — F988 Other specified behavioral and emotional disorders with onset usually occurring in childhood and adolescence: Secondary | ICD-10-CM

## 2015-06-16 NOTE — Telephone Encounter (Signed)
Pt is requesting a 3 mo refill of Vyvanse 70 mg [phone]

## 2015-06-16 NOTE — Telephone Encounter (Signed)
Approved to print 3 Rxes -- 2 with future dates.

## 2015-06-16 NOTE — Telephone Encounter (Signed)
LOV 03/04/15  LRF for 05/04/15  OK refill?

## 2015-06-17 MED ORDER — LISDEXAMFETAMINE DIMESYLATE 70 MG PO CAPS: 70.0000 mg | ORAL_CAPSULE | Freq: Every day | ORAL | Status: DC

## 2015-06-19 NOTE — Telephone Encounter (Signed)
Tried to call.  Rx's are ready

## 2015-06-23 NOTE — Telephone Encounter (Signed)
Patient aware vyvanse rx's is ready for pick up.

## 2015-09-18 ENCOUNTER — Other Ambulatory Visit: Payer: Self-pay

## 2015-09-18 DIAGNOSIS — F988 Other specified behavioral and emotional disorders with onset usually occurring in childhood and adolescence: Secondary | ICD-10-CM

## 2015-09-18 NOTE — Telephone Encounter (Signed)
Last ov 03/04/15 Last printed  rx refill 06/16/15 for 08/18/15 Ok to refill?

## 2015-09-22 MED ORDER — LISDEXAMFETAMINE DIMESYLATE 70 MG PO CAPS: 70.0000 mg | ORAL_CAPSULE | Freq: Every day | ORAL | 0 refills | Status: DC

## 2015-09-22 NOTE — Telephone Encounter (Signed)
Approved.  May print 3 Rxes----1 to fill now, 1 --"do not fill until 10/22/2015, 1---"do not fill until 11/22/2015"

## 2015-09-22 NOTE — Telephone Encounter (Signed)
RX printed pt can pick up 9/6 after 2pm

## 2015-10-22 ENCOUNTER — Other Ambulatory Visit: Payer: Self-pay | Admitting: Orthopedic Surgery

## 2015-10-22 ENCOUNTER — Encounter (HOSPITAL_BASED_OUTPATIENT_CLINIC_OR_DEPARTMENT_OTHER): Payer: Self-pay | Admitting: *Deleted

## 2015-10-29 ENCOUNTER — Encounter (HOSPITAL_BASED_OUTPATIENT_CLINIC_OR_DEPARTMENT_OTHER)
Admission: RE | Disposition: A | Discharge status: Home / Self Care | Payer: Self-pay | Source: Ambulatory Visit | Attending: Orthopedic Surgery

## 2015-10-29 ENCOUNTER — Ambulatory Visit (HOSPITAL_BASED_OUTPATIENT_CLINIC_OR_DEPARTMENT_OTHER): Payer: 59 | Admitting: Anesthesiology

## 2015-10-29 ENCOUNTER — Ambulatory Visit (HOSPITAL_BASED_OUTPATIENT_CLINIC_OR_DEPARTMENT_OTHER)
Admission: RE | Admit: 2015-10-29 | Discharge: 2015-10-29 | Disposition: A | Discharge status: Home / Self Care | Payer: 59 | Source: Ambulatory Visit | Attending: Orthopedic Surgery | Admitting: Orthopedic Surgery

## 2015-10-29 ENCOUNTER — Encounter (HOSPITAL_BASED_OUTPATIENT_CLINIC_OR_DEPARTMENT_OTHER): Payer: Self-pay | Admitting: Certified Registered"

## 2015-10-29 DIAGNOSIS — Z6832 Body mass index (BMI) 32.0-32.9, adult: Secondary | ICD-10-CM | POA: Insufficient documentation

## 2015-10-29 DIAGNOSIS — F909 Attention-deficit hyperactivity disorder, unspecified type: Secondary | ICD-10-CM | POA: Diagnosis not present

## 2015-10-29 DIAGNOSIS — M67432 Ganglion, left wrist: Secondary | ICD-10-CM | POA: Diagnosis present

## 2015-10-29 DIAGNOSIS — F17210 Nicotine dependence, cigarettes, uncomplicated: Secondary | ICD-10-CM | POA: Insufficient documentation

## 2015-10-29 HISTORY — PX: MASS EXCISION: SHX2000

## 2015-10-29 SURGERY — EXCISION MASS
Anesthesia: General | Site: Wrist | Laterality: Left

## 2015-10-29 MED ORDER — PROPOFOL 10 MG/ML IV BOLUS
INTRAVENOUS | Status: AC
  Filled 2015-10-29: qty 20

## 2015-10-29 MED ORDER — CHLORHEXIDINE GLUCONATE 4 % EX LIQD: 60.0000 mL | Freq: Once | CUTANEOUS | Status: DC

## 2015-10-29 MED ORDER — LIDOCAINE 2% (20 MG/ML) 5 ML SYRINGE
INTRAMUSCULAR | Status: DC | PRN
Start: 2015-10-29 — End: 2015-10-29
  Administered 2015-10-29: 100 mg via INTRAVENOUS

## 2015-10-29 MED ORDER — SCOPOLAMINE 1 MG/3DAYS TD PT72: 1.0000 | MEDICATED_PATCH | Freq: Once | TRANSDERMAL | Status: DC | PRN

## 2015-10-29 MED ORDER — OXYCODONE HCL 5 MG/5ML PO SOLN: 5.0000 mg | Freq: Once | ORAL | Status: AC | PRN

## 2015-10-29 MED ORDER — FENTANYL CITRATE (PF) 100 MCG/2ML IJ SOLN
INTRAMUSCULAR | Status: AC
  Filled 2015-10-29: qty 2

## 2015-10-29 MED ORDER — DEXAMETHASONE SODIUM PHOSPHATE 10 MG/ML IJ SOLN
INTRAMUSCULAR | Status: AC
  Filled 2015-10-29: qty 1

## 2015-10-29 MED ORDER — MIDAZOLAM HCL 2 MG/2ML IJ SOLN
INTRAMUSCULAR | Status: AC
  Filled 2015-10-29: qty 2

## 2015-10-29 MED ORDER — FENTANYL CITRATE (PF) 100 MCG/2ML IJ SOLN
50.0000 ug | INTRAMUSCULAR | Status: DC | PRN
  Administered 2015-10-29: 100 ug via INTRAVENOUS

## 2015-10-29 MED ORDER — MIDAZOLAM HCL 2 MG/2ML IJ SOLN
1.0000 mg | INTRAMUSCULAR | Status: DC | PRN
Start: 2015-10-29 — End: 2015-10-29
  Administered 2015-10-29: 2 mg via INTRAVENOUS

## 2015-10-29 MED ORDER — GLYCOPYRROLATE 0.2 MG/ML IJ SOLN: 0.2000 mg | Freq: Once | INTRAMUSCULAR | Status: DC | PRN

## 2015-10-29 MED ORDER — BUPIVACAINE HCL (PF) 0.25 % IJ SOLN
INTRAMUSCULAR | Status: DC | PRN
  Administered 2015-10-29: 6 mL

## 2015-10-29 MED ORDER — DEXAMETHASONE SODIUM PHOSPHATE 10 MG/ML IJ SOLN
INTRAMUSCULAR | Status: DC | PRN
  Administered 2015-10-29: 10 mg via INTRAVENOUS

## 2015-10-29 MED ORDER — HYDROCODONE-ACETAMINOPHEN 5-325 MG PO TABS: ORAL_TABLET | ORAL | 0 refills | Status: DC

## 2015-10-29 MED ORDER — HYDROMORPHONE HCL 1 MG/ML IJ SOLN
0.2500 mg | INTRAMUSCULAR | Status: DC | PRN
  Administered 2015-10-29 (×2): 0.5 mg via INTRAVENOUS

## 2015-10-29 MED ORDER — ONDANSETRON HCL 4 MG/2ML IJ SOLN: 4.0000 mg | Freq: Once | INTRAMUSCULAR | Status: DC | PRN

## 2015-10-29 MED ORDER — OXYCODONE HCL 5 MG PO TABS
ORAL_TABLET | ORAL | Status: AC
  Filled 2015-10-29: qty 1

## 2015-10-29 MED ORDER — MEPERIDINE HCL 25 MG/ML IJ SOLN: 6.2500 mg | INTRAMUSCULAR | Status: DC | PRN

## 2015-10-29 MED ORDER — ONDANSETRON HCL 4 MG/2ML IJ SOLN
INTRAMUSCULAR | Status: AC
  Filled 2015-10-29: qty 2

## 2015-10-29 MED ORDER — CEFAZOLIN SODIUM-DEXTROSE 2-4 GM/100ML-% IV SOLN
2.0000 g | INTRAVENOUS | Status: AC
  Administered 2015-10-29: 2 g via INTRAVENOUS

## 2015-10-29 MED ORDER — LIDOCAINE 2% (20 MG/ML) 5 ML SYRINGE
INTRAMUSCULAR | Status: AC
  Filled 2015-10-29: qty 5

## 2015-10-29 MED ORDER — PROPOFOL 10 MG/ML IV BOLUS
INTRAVENOUS | Status: DC | PRN
  Administered 2015-10-29: 300 mg via INTRAVENOUS

## 2015-10-29 MED ORDER — CEFAZOLIN SODIUM-DEXTROSE 2-4 GM/100ML-% IV SOLN
INTRAVENOUS | Status: AC
  Filled 2015-10-29: qty 100

## 2015-10-29 MED ORDER — LACTATED RINGERS IV SOLN
INTRAVENOUS | Status: DC
  Administered 2015-10-29 (×2): 10 mL/h via INTRAVENOUS

## 2015-10-29 MED ORDER — OXYCODONE HCL 5 MG PO TABS
5.0000 mg | ORAL_TABLET | Freq: Once | ORAL | Status: AC | PRN
  Administered 2015-10-29: 5 mg via ORAL

## 2015-10-29 MED ORDER — HYDROMORPHONE HCL 1 MG/ML IJ SOLN
INTRAMUSCULAR | Status: AC
  Filled 2015-10-29: qty 1

## 2015-10-29 MED ORDER — ONDANSETRON HCL 4 MG/2ML IJ SOLN
INTRAMUSCULAR | Status: DC | PRN
  Administered 2015-10-29: 4 mg via INTRAVENOUS

## 2015-10-29 SURGICAL SUPPLY — 48 items
BANDAGE ACE 3X5.8 VEL STRL LF (GAUZE/BANDAGES/DRESSINGS) IMPLANT
BANDAGE COBAN STERILE 2 (GAUZE/BANDAGES/DRESSINGS) IMPLANT
BENZOIN TINCTURE PRP APPL 2/3 (GAUZE/BANDAGES/DRESSINGS) IMPLANT
BLADE MINI RND TIP GREEN BEAV (BLADE) IMPLANT
BLADE SURG 15 STRL LF DISP TIS (BLADE) ×2 IMPLANT
BLADE SURG 15 STRL SS (BLADE) ×2
BNDG COHESIVE 1X5 TAN STRL LF (GAUZE/BANDAGES/DRESSINGS) IMPLANT
BNDG CONFORM 2 STRL LF (GAUZE/BANDAGES/DRESSINGS) IMPLANT
BNDG ELASTIC 2X5.8 VLCR STR LF (GAUZE/BANDAGES/DRESSINGS) IMPLANT
BNDG ESMARK 4X9 LF (GAUZE/BANDAGES/DRESSINGS) IMPLANT
BNDG GAUZE 1X2.1 STRL (MISCELLANEOUS) IMPLANT
BNDG GAUZE ELAST 4 BULKY (GAUZE/BANDAGES/DRESSINGS) IMPLANT
BNDG PLASTER X FAST 3X3 WHT LF (CAST SUPPLIES) IMPLANT
CHLORAPREP W/TINT 26ML (MISCELLANEOUS) ×2 IMPLANT
CORDS BIPOLAR (ELECTRODE) ×2 IMPLANT
COVER BACK TABLE 60X90IN (DRAPES) ×2 IMPLANT
COVER MAYO STAND STRL (DRAPES) ×2 IMPLANT
CUFF TOURNIQUET SINGLE 18IN (TOURNIQUET CUFF) ×2 IMPLANT
DRAPE EXTREMITY T 121X128X90 (DRAPE) ×2 IMPLANT
DRAPE SURG 17X23 STRL (DRAPES) ×2 IMPLANT
GAUZE SPONGE 4X4 12PLY STRL (GAUZE/BANDAGES/DRESSINGS) ×2 IMPLANT
GAUZE XEROFORM 1X8 LF (GAUZE/BANDAGES/DRESSINGS) ×2 IMPLANT
GLOVE BIO SURGEON STRL SZ7.5 (GLOVE) ×2 IMPLANT
GLOVE BIOGEL PI IND STRL 8 (GLOVE) ×1 IMPLANT
GLOVE BIOGEL PI INDICATOR 8 (GLOVE) ×1
GOWN STRL REUS W/ TWL LRG LVL3 (GOWN DISPOSABLE) ×1 IMPLANT
GOWN STRL REUS W/TWL LRG LVL3 (GOWN DISPOSABLE) ×1
GOWN STRL REUS W/TWL XL LVL3 (GOWN DISPOSABLE) ×2 IMPLANT
NEEDLE HYPO 25X1 1.5 SAFETY (NEEDLE) ×2 IMPLANT
NS IRRIG 1000ML POUR BTL (IV SOLUTION) ×2 IMPLANT
PACK BASIN DAY SURGERY FS (CUSTOM PROCEDURE TRAY) ×2 IMPLANT
PAD CAST 3X4 CTTN HI CHSV (CAST SUPPLIES) IMPLANT
PAD CAST 4YDX4 CTTN HI CHSV (CAST SUPPLIES) IMPLANT
PADDING CAST ABS 4INX4YD NS (CAST SUPPLIES) ×1
PADDING CAST ABS COTTON 4X4 ST (CAST SUPPLIES) ×1 IMPLANT
PADDING CAST COTTON 3X4 STRL (CAST SUPPLIES)
PADDING CAST COTTON 4X4 STRL (CAST SUPPLIES)
STOCKINETTE 4X48 STRL (DRAPES) ×2 IMPLANT
STRIP CLOSURE SKIN 1/2X4 (GAUZE/BANDAGES/DRESSINGS) IMPLANT
SUT ETHILON 3 0 PS 1 (SUTURE) IMPLANT
SUT ETHILON 4 0 PS 2 18 (SUTURE) ×2 IMPLANT
SUT ETHILON 5 0 P 3 18 (SUTURE)
SUT NYLON ETHILON 5-0 P-3 1X18 (SUTURE) IMPLANT
SUT VIC AB 4-0 P2 18 (SUTURE) IMPLANT
SYR BULB 3OZ (MISCELLANEOUS) ×2 IMPLANT
SYR CONTROL 10ML LL (SYRINGE) ×2 IMPLANT
TOWEL OR 17X24 6PK STRL BLUE (TOWEL DISPOSABLE) ×4 IMPLANT
UNDERPAD 30X30 (UNDERPADS AND DIAPERS) ×2 IMPLANT

## 2015-10-29 NOTE — Anesthesia Postprocedure Evaluation (Signed)
Anesthesia Post Note  Patient: Eric Hodge  Procedure(s) Performed: Procedure(s) (LRB): LEFT WRIST EXCISION MASS (Left)  Patient location during evaluation: PACU Anesthesia Type: General Level of consciousness: awake and alert Pain management: pain level controlled Vital Signs Assessment: post-procedure vital signs reviewed and stable Respiratory status: spontaneous breathing, nonlabored ventilation and respiratory function stable Cardiovascular status: blood pressure returned to baseline and stable Postop Assessment: no signs of nausea or vomiting Anesthetic complications: no    Last Vitals:  Vitals:   10/29/15 1139 10/29/15 1200  BP:  131/72  Pulse: 79 72  Resp: 17 20  Temp:  36.4 C    Last Pain:  Vitals:   10/29/15 1200  TempSrc:   PainSc: 6                  Jacky Dross A

## 2015-10-29 NOTE — Op Note (Signed)
[id number] 

## 2015-10-29 NOTE — Anesthesia Preprocedure Evaluation (Signed)
Anesthesia Evaluation  Patient identified by MRN, date of birth, ID band Patient awake    Reviewed: Allergy & Precautions, NPO status , Patient's Chart, lab work & pertinent test results  Airway Mallampati: I  TM Distance: >3 FB Neck ROM: Full    Dental  (+) Teeth Intact, Dental Advisory Given   Pulmonary Current Smoker,    breath sounds clear to auscultation       Cardiovascular  Rhythm:Regular Rate:Normal     Neuro/Psych    GI/Hepatic   Endo/Other  Morbid obesity  Renal/GU      Musculoskeletal   Abdominal   Peds  Hematology   Anesthesia Other Findings   Reproductive/Obstetrics                             Anesthesia Physical Anesthesia Plan  ASA: II  Anesthesia Plan: General   Post-op Pain Management:    Induction: Intravenous  Airway Management Planned: LMA  Additional Equipment:   Intra-op Plan:   Post-operative Plan: Extubation in OR  Informed Consent: I have reviewed the patients History and Physical, chart, labs and discussed the procedure including the risks, benefits and alternatives for the proposed anesthesia with the patient or authorized representative who has indicated his/her understanding and acceptance.   Dental advisory given  Plan Discussed with: CRNA, Anesthesiologist and Surgeon  Anesthesia Plan Comments:         Anesthesia Quick Evaluation  

## 2015-10-29 NOTE — Discharge Instructions (Addendum)

## 2015-10-29 NOTE — Brief Op Note (Signed)
10/29/2015  10:27 AM  PATIENT:  Eric Hodge  30 y.o. male  PRE-OPERATIVE DIAGNOSIS:  LEFT WRIST GANGLION  POST-OPERATIVE DIAGNOSIS:  LEFT WRIST GANGLION  PROCEDURE:  Procedure(s) with comments: LEFT WRIST EXCISION MASS (Left) - LEFT WRIST EXCISION MASS  SURGEON:  Surgeon(s) and Role:    * Betha LoaKevin Natasia Sanko, MD - Primary  PHYSICIAN ASSISTANT:   ASSISTANTS: none   ANESTHESIA:   general  EBL:  Total I/O In: 800 [I.V.:800] Out: 5 [Blood:5]  BLOOD ADMINISTERED:none  DRAINS: none   LOCAL MEDICATIONS USED:  MARCAINE     SPECIMEN:  Source of Specimen:  left wrist  DISPOSITION OF SPECIMEN:  PATHOLOGY  COUNTS:  YES  TOURNIQUET:   Total Tourniquet Time Documented: Upper Arm (Left) - 31 minutes Total: Upper Arm (Left) - 31 minutes   DICTATION: .Other Dictation: Dictation Number [id number]  PLAN OF CARE: Discharge to home after PACU  PATIENT DISPOSITION:  PACU - hemodynamically stable.

## 2015-10-29 NOTE — Transfer of Care (Signed)
Immediate Anesthesia Transfer of Care Note  Patient: Eric Hodge  Procedure(s) Performed: Procedure(s) with comments: LEFT WRIST EXCISION MASS (Left) - LEFT WRIST EXCISION MASS  Patient Location: PACU  Anesthesia Type:General  Level of Consciousness: awake, alert  and patient cooperative  Airway & Oxygen Therapy: Patient Spontanous Breathing and Patient connected to face mask oxygen  Post-op Assessment: Report given to RN, Post -op Vital signs reviewed and stable and Patient moving all extremities  Post vital signs: Reviewed and stable  Last Vitals:  Vitals:   10/29/15 0811  BP: 129/78  Pulse: 80  Resp: 18  Temp: 36.4 C    Last Pain:  Vitals:   10/29/15 0811  TempSrc: Oral  PainSc: 7          Complications: No apparent anesthesia complications

## 2015-10-29 NOTE — Anesthesia Procedure Notes (Signed)
Procedure Name: LMA Insertion Date/Time: 10/29/2015 9:44 AM Performed by: Curly ShoresRAFT, Clarity Ciszek W Pre-anesthesia Checklist: Patient identified, Emergency Drugs available, Suction available and Patient being monitored Patient Re-evaluated:Patient Re-evaluated prior to inductionOxygen Delivery Method: Circle system utilized Preoxygenation: Pre-oxygenation with 100% oxygen Intubation Type: IV induction Ventilation: Mask ventilation without difficulty LMA: LMA inserted LMA Size: 5.0 Number of attempts: 1 Airway Equipment and Method: Bite block Placement Confirmation: positive ETCO2 and breath sounds checked- equal and bilateral Tube secured with: Tape Dental Injury: Teeth and Oropharynx as per pre-operative assessment

## 2015-10-29 NOTE — H&P (Signed)
Eric Hodge is an 30 y.o. male.   Chief Complaint: left wrist ganglion HPI: 30 yo rhd male with mass left wrist x 1 year.  It has gotten more bothersome.  He wishes to have it removed.  Allergies: No Known Allergies  Past Medical History:  Diagnosis Date  . ADHD   . Ganglion cyst of wrist, left 10/2015  . Seasonal allergies     Past Surgical History:  Procedure Laterality Date  . FOREIGN BODY REMOVAL  09/08/2011   Procedure: REMOVAL FOREIGN BODY EXTREMITY;  Surgeon: Tami RibasKevin Hodge Jewett Mcgann, MD;  Location: [facility];  Service: Orthopedics;  Laterality: Left;  FOREIGN BODY REMOVAL LEFT LONG FINGER  . WISDOM TOOTH EXTRACTION  2012    Family History: Family History  Problem Relation Age of Onset  . Stroke Paternal Grandmother   . Hypertension Paternal Grandmother   . Cancer Paternal Grandfather   . Hyperlipidemia Paternal Grandfather     Social History:   reports that he has been smoking Cigarettes.  He has a 4.50 pack-year smoking history. He has never used smokeless tobacco. He reports that he drinks alcohol. He reports that he does not use drugs.  Medications: Medications Prior to Admission  Medication Sig Dispense Refill  . cetirizine (ZYRTEC) 10 MG tablet Take 1 tablet (10 mg total) by mouth daily. 30 tablet 11  . diclofenac (VOLTAREN) 75 MG EC tablet Take 75 mg by mouth as needed. Knee pain    . HYDROcodone-acetaminophen (NORCO) 10-325 MG per tablet Take 1 tablet by mouth every 6 (six) hours as needed (for knee pain).    Marland Kitchen. lisdexamfetamine (VYVANSE) 70 MG capsule Take 1 capsule (70 mg total) by mouth daily. 30 capsule 0    No results found for this or any previous visit (from the past 48 hour(s)).  No results found.   A comprehensive review of systems was negative.  Blood pressure 129/78, pulse 80, temperature 97.6 F (36.4 C), temperature source Oral, resp. rate 18, height 5\' 11"  (1.803 m), weight 107 kg (236 lb), SpO2 100 %.  General appearance: alert,  cooperative and appears stated age Head: Normocephalic, without obvious abnormality, atraumatic Neck: supple, symmetrical, trachea midline Resp: clear to auscultation bilaterally Cardio: regular rate and rhythm GI: non-tender Extremities: Intact sensation and capillary refill all digits.  +epl/fpl/io.  No wounds.  Pulses: 2+ and symmetric Skin: Skin color, texture, turgor normal. No rashes or lesions Neurologic: Grossly normal Incision/Wound:none  Assessment/Plan Left wrist dorsal ganglion.  Non operative and operative treatment options were discussed with the patient and patient wishes to proceed with operative treatment. Risks, benefits, and alternatives of surgery were discussed and the patient agrees with the plan of care.   Eric Hodge 10/29/2015, 8:42 AM

## 2015-10-30 ENCOUNTER — Encounter (HOSPITAL_BASED_OUTPATIENT_CLINIC_OR_DEPARTMENT_OTHER): Payer: Self-pay | Admitting: Orthopedic Surgery

## 2015-10-30 NOTE — Op Note (Signed)
Eric Hodge               ACCOUNT NO.:  [id number]  MEDICAL RECORD NO.:  [id number]  LOCATION:                                 FACILITY:  PHYSICIAN:  Eric Loa, MD             DATE OF BIRTH:  DATE OF PROCEDURE:  10/29/2015 DATE OF DISCHARGE:                              OPERATIVE REPORT   PREOPERATIVE DIAGNOSIS:  Left wrist dorsal ganglion.  POSTOPERATIVE DIAGNOSIS:  Left wrist dorsal ganglion.  PROCEDURE:  Left wrist excision of dorsal ganglion.  SURGEON:  Eric Loa, MD.  ASSISTANT:  None.  ANESTHESIA:  General IV.  FLUIDS:  Per anesthesia flow sheet.  ESTIMATED BLOOD LOSS:  Minimal.  COMPLICATIONS:  None.  SPECIMENS:  Ganglion to Pathology.  TOURNIQUET TIME:  31 minutes.  DISPOSITION:  Stable to PACU.  INDICATIONS:  Eric Hodge is a 30 year old male, who has noted a mass on his left wrist.  This is bothersome to him, especially with extension. He wishes to have it removed.  Risks, benefits, and alternatives of surgery were discussed including risk of blood loss; infection; damage to nerves, vessels, tendons, ligaments, bone; failure of surgery; need for additional surgery; complications with wound healing; continued pain; recurrence of mass.  He voiced understanding of these risks and elected to proceed.  OPERATIVE COURSE:  After being identified preoperatively by myself, the patient agreed upon procedure and site of procedure.  Surgical site was marked.  The risks, benefits, and alternatives of surgery were reviewed and he wished to proceed.  Surgical consent had been signed.  He was given IV Ancef as preoperative antibiotic prophylaxis.  He was transferred to the operating room and placed on the operating room table in supine position with left upper extremity on armboard.  General anesthesia was induced by anesthesiologist.  Left upper extremity was prepped and draped in normal sterile orthopedic fashion.  Surgical pause was performed between  surgeons, anesthesia, and operating room staff and all were in agreement as to the patient, procedure, and site of procedure.  Tourniquet at the proximal aspect of the extremity was inflated to 250 mmHg after exsanguination of the limb with an Esmarch bandage.  Incision was made transversely at the mass on the dorsum of the wrist.  This was carried down to subcutaneous tissues by spreading technique.  Neurovascular structures were protected.  The mass was identified.  It appeared to be large sessile ganglion.  It was radial to the fourth dorsal compartment tendons.  It was carefully freed up of soft tissue attachments.  It was able to be removed and sent to Pathology for examination.  The rent in the capsule was rather large. There was no distinct stalk.  The capsular rent was able to be repaired with 4-0 Vicryl suture in a figure-of-eight fashion.  The wound was then copiously irrigated with sterile saline.  Inverted interrupted Vicryl sutures were placed in the subcutaneous tissues and skin was closed with 4-0 nylon in a horizontal mattress fashion.  The wound was injected with 0.25% plain Marcaine to aid in postoperative analgesia.  It was then dressed with sterile Xeroform, 4x4s, and wrapped with a Kerlix bandage.  A volar splint was placed and wrapped with Kerlix and Ace bandage. Tourniquet was deflated at 31 minutes.  Fingertips were pink with brisk capillary refill after deflation of tourniquet.  Operative drapes were broken down and the patient was awoken from anesthesia safely.  He was transferred back to stretcher and taken to PACU in stable condition.  I will see him back in the office in 1 week for postoperative followup.  I will give him Norco 5/325 mg one to two p.o. q.6 hours p.r.n. pain, dispensed #20.     Eric LoaKevin Jhordan Mckibben, MD     KK/MEDQ  D:  10/29/2015  T:  10/30/2015  Job:  [id number]

## 2015-12-22 ENCOUNTER — Other Ambulatory Visit: Payer: Self-pay

## 2015-12-22 NOTE — Telephone Encounter (Signed)
Last refill 11-5 Last OV 2-15 Ok to refill?

## 2015-12-23 MED ORDER — LISDEXAMFETAMINE DIMESYLATE 70 MG PO CAPS: 70.0000 mg | ORAL_CAPSULE | Freq: Every day | ORAL | 0 refills | Status: DC

## 2015-12-23 NOTE — Telephone Encounter (Signed)
Approved to refill but needs to schedule OV prior to next Rx being due

## 2015-12-23 NOTE — Telephone Encounter (Signed)
Rx can be picked up after 2 pm  Rx filled pt need OV before next refill pt made aware

## 2016-04-25 ENCOUNTER — Encounter: Payer: Self-pay | Admitting: Physician Assistant

## 2016-05-23 ENCOUNTER — Encounter: Payer: Self-pay | Admitting: Physician Assistant

## 2016-06-08 DIAGNOSIS — M25562 Pain in left knee: Secondary | ICD-10-CM | POA: Diagnosis not present

## 2016-06-08 DIAGNOSIS — S83201D Bucket-handle tear of unspecified meniscus, current injury, left knee, subsequent encounter: Secondary | ICD-10-CM | POA: Diagnosis not present

## 2016-10-03 ENCOUNTER — Other Ambulatory Visit: Payer: Self-pay | Admitting: Orthopedic Surgery

## 2016-10-04 ENCOUNTER — Encounter (HOSPITAL_BASED_OUTPATIENT_CLINIC_OR_DEPARTMENT_OTHER): Payer: Self-pay | Admitting: *Deleted

## 2016-10-06 ENCOUNTER — Ambulatory Visit (HOSPITAL_BASED_OUTPATIENT_CLINIC_OR_DEPARTMENT_OTHER): Admitting: Certified Registered"

## 2016-10-06 ENCOUNTER — Ambulatory Visit (HOSPITAL_BASED_OUTPATIENT_CLINIC_OR_DEPARTMENT_OTHER)
Admission: RE | Admit: 2016-10-06 | Discharge: 2016-10-06 | Disposition: A | Discharge status: Home / Self Care | Source: Ambulatory Visit | Attending: Orthopedic Surgery | Admitting: Orthopedic Surgery

## 2016-10-06 ENCOUNTER — Encounter (HOSPITAL_BASED_OUTPATIENT_CLINIC_OR_DEPARTMENT_OTHER)
Admission: RE | Disposition: A | Discharge status: Home / Self Care | Payer: Self-pay | Source: Ambulatory Visit | Attending: Orthopedic Surgery

## 2016-10-06 ENCOUNTER — Encounter (HOSPITAL_BASED_OUTPATIENT_CLINIC_OR_DEPARTMENT_OTHER): Payer: Self-pay

## 2016-10-06 DIAGNOSIS — S52571A Other intraarticular fracture of lower end of right radius, initial encounter for closed fracture: Secondary | ICD-10-CM | POA: Diagnosis not present

## 2016-10-06 DIAGNOSIS — F17210 Nicotine dependence, cigarettes, uncomplicated: Secondary | ICD-10-CM | POA: Insufficient documentation

## 2016-10-06 DIAGNOSIS — G5601 Carpal tunnel syndrome, right upper limb: Secondary | ICD-10-CM | POA: Insufficient documentation

## 2016-10-06 DIAGNOSIS — Y929 Unspecified place or not applicable: Secondary | ICD-10-CM | POA: Diagnosis not present

## 2016-10-06 DIAGNOSIS — Y990 Civilian activity done for income or pay: Secondary | ICD-10-CM | POA: Insufficient documentation

## 2016-10-06 DIAGNOSIS — W19XXXA Unspecified fall, initial encounter: Secondary | ICD-10-CM | POA: Diagnosis not present

## 2016-10-06 HISTORY — PX: CARPAL TUNNEL RELEASE: SHX101

## 2016-10-06 HISTORY — PX: OPEN REDUCTION INTERNAL FIXATION (ORIF) DISTAL RADIAL FRACTURE: SHX5989

## 2016-10-06 SURGERY — OPEN REDUCTION INTERNAL FIXATION (ORIF) DISTAL RADIUS FRACTURE
Anesthesia: Regional | Site: Wrist | Laterality: Right

## 2016-10-06 MED ORDER — CEFAZOLIN SODIUM-DEXTROSE 2-4 GM/100ML-% IV SOLN
2.0000 g | INTRAVENOUS | Status: AC
  Administered 2016-10-06: 2 g via INTRAVENOUS

## 2016-10-06 MED ORDER — DEXAMETHASONE SODIUM PHOSPHATE 10 MG/ML IJ SOLN
INTRAMUSCULAR | Status: AC
  Filled 2016-10-06: qty 1

## 2016-10-06 MED ORDER — MIDAZOLAM HCL 2 MG/2ML IJ SOLN
INTRAMUSCULAR | Status: AC
  Filled 2016-10-06: qty 2

## 2016-10-06 MED ORDER — LIDOCAINE HCL (CARDIAC) 20 MG/ML IV SOLN
INTRAVENOUS | Status: DC | PRN
  Administered 2016-10-06: 50 mg via INTRAVENOUS

## 2016-10-06 MED ORDER — LACTATED RINGERS IV SOLN
INTRAVENOUS | Status: DC
  Administered 2016-10-06: 12:00:00 via INTRAVENOUS

## 2016-10-06 MED ORDER — ONDANSETRON HCL 4 MG/2ML IJ SOLN
INTRAMUSCULAR | Status: DC | PRN
  Administered 2016-10-06: 4 mg via INTRAVENOUS

## 2016-10-06 MED ORDER — FENTANYL CITRATE (PF) 100 MCG/2ML IJ SOLN
50.0000 ug | INTRAMUSCULAR | Status: AC | PRN
  Administered 2016-10-06: 25 ug via INTRAVENOUS
  Administered 2016-10-06: 100 ug via INTRAVENOUS
  Administered 2016-10-06: 25 ug via INTRAVENOUS
  Administered 2016-10-06: 50 ug via INTRAVENOUS

## 2016-10-06 MED ORDER — PROPOFOL 10 MG/ML IV BOLUS
INTRAVENOUS | Status: DC | PRN
  Administered 2016-10-06 (×2): 20 mg via INTRAVENOUS
  Administered 2016-10-06: 200 mg via INTRAVENOUS

## 2016-10-06 MED ORDER — CHLORHEXIDINE GLUCONATE 4 % EX LIQD: 60.0000 mL | Freq: Once | CUTANEOUS | Status: DC

## 2016-10-06 MED ORDER — SCOPOLAMINE 1 MG/3DAYS TD PT72: 1.0000 | MEDICATED_PATCH | Freq: Once | TRANSDERMAL | Status: DC | PRN

## 2016-10-06 MED ORDER — LIDOCAINE 2% (20 MG/ML) 5 ML SYRINGE
INTRAMUSCULAR | Status: AC
  Filled 2016-10-06: qty 5

## 2016-10-06 MED ORDER — PROPOFOL 500 MG/50ML IV EMUL
INTRAVENOUS | Status: DC | PRN
  Administered 2016-10-06: 50 ug/kg/min via INTRAVENOUS

## 2016-10-06 MED ORDER — FENTANYL CITRATE (PF) 100 MCG/2ML IJ SOLN
INTRAMUSCULAR | Status: AC
  Filled 2016-10-06: qty 2

## 2016-10-06 MED ORDER — OXYCODONE-ACETAMINOPHEN 5-325 MG PO TABS: ORAL_TABLET | ORAL | 0 refills | Status: DC

## 2016-10-06 MED ORDER — MIDAZOLAM HCL 2 MG/2ML IJ SOLN
1.0000 mg | INTRAMUSCULAR | Status: DC | PRN
  Administered 2016-10-06 (×2): 1 mg via INTRAVENOUS
  Administered 2016-10-06: 2 mg via INTRAVENOUS

## 2016-10-06 MED ORDER — FENTANYL CITRATE (PF) 100 MCG/2ML IJ SOLN: 25.0000 ug | INTRAMUSCULAR | Status: DC | PRN

## 2016-10-06 MED ORDER — ONDANSETRON HCL 4 MG/2ML IJ SOLN
INTRAMUSCULAR | Status: AC
  Filled 2016-10-06: qty 2

## 2016-10-06 MED ORDER — MEPERIDINE HCL 25 MG/ML IJ SOLN: 6.2500 mg | INTRAMUSCULAR | Status: DC | PRN

## 2016-10-06 MED ORDER — PROMETHAZINE HCL 25 MG/ML IJ SOLN: 6.2500 mg | INTRAMUSCULAR | Status: DC | PRN

## 2016-10-06 MED ORDER — CEFAZOLIN SODIUM-DEXTROSE 2-4 GM/100ML-% IV SOLN
INTRAVENOUS | Status: AC
  Filled 2016-10-06: qty 100

## 2016-10-06 SURGICAL SUPPLY — 63 items
BANDAGE ACE 3X5.8 VEL STRL LF (GAUZE/BANDAGES/DRESSINGS) ×3 IMPLANT
BIT DRILL 2.0 LNG QUCK RELEASE (BIT) ×2 IMPLANT
BIT DRILL 2.8X5 QR DISP (BIT) ×3 IMPLANT
BLADE SURG 15 STRL LF DISP TIS (BLADE) ×4 IMPLANT
BLADE SURG 15 STRL SS (BLADE) ×2
BNDG ESMARK 4X9 LF (GAUZE/BANDAGES/DRESSINGS) ×3 IMPLANT
BNDG GAUZE ELAST 4 BULKY (GAUZE/BANDAGES/DRESSINGS) ×3 IMPLANT
BNDG PLASTER X FAST 3X3 WHT LF (CAST SUPPLIES) ×30 IMPLANT
CHLORAPREP W/TINT 26ML (MISCELLANEOUS) ×3 IMPLANT
CORD BIPOLAR FORCEPS 12FT (ELECTRODE) ×3 IMPLANT
COVER BACK TABLE 60X90IN (DRAPES) ×3 IMPLANT
COVER MAYO STAND STRL (DRAPES) ×3 IMPLANT
CUFF TOURNIQUET SINGLE 18IN (TOURNIQUET CUFF) IMPLANT
CUFF TOURNIQUET SINGLE 24IN (TOURNIQUET CUFF) ×3 IMPLANT
DRAPE EXTREMITY T 121X128X90 (DRAPE) ×3 IMPLANT
DRAPE OEC MINIVIEW 54X84 (DRAPES) ×3 IMPLANT
DRAPE SURG 17X23 STRL (DRAPES) ×3 IMPLANT
DRILL 2.0 LNG QUICK RELEASE (BIT) ×3
DRSG PAD ABDOMINAL 8X10 ST (GAUZE/BANDAGES/DRESSINGS) ×3 IMPLANT
GAUZE SPONGE 4X4 12PLY STRL (GAUZE/BANDAGES/DRESSINGS) ×3 IMPLANT
GAUZE XEROFORM 1X8 LF (GAUZE/BANDAGES/DRESSINGS) ×3 IMPLANT
GLOVE BIO SURGEON STRL SZ 6.5 (GLOVE) ×3 IMPLANT
GLOVE BIO SURGEON STRL SZ7.5 (GLOVE) ×3 IMPLANT
GLOVE BIOGEL PI IND STRL 7.0 (GLOVE) ×4 IMPLANT
GLOVE BIOGEL PI IND STRL 8 (GLOVE) ×2 IMPLANT
GLOVE BIOGEL PI IND STRL 8.5 (GLOVE) ×2 IMPLANT
GLOVE BIOGEL PI INDICATOR 7.0 (GLOVE) ×2
GLOVE BIOGEL PI INDICATOR 8 (GLOVE) ×1
GLOVE BIOGEL PI INDICATOR 8.5 (GLOVE) ×1
GLOVE SURG ORTHO 8.0 STRL STRW (GLOVE) ×3 IMPLANT
GOWN STRL REUS W/ TWL LRG LVL3 (GOWN DISPOSABLE) ×2 IMPLANT
GOWN STRL REUS W/TWL LRG LVL3 (GOWN DISPOSABLE) ×1
GOWN STRL REUS W/TWL XL LVL3 (GOWN DISPOSABLE) ×3 IMPLANT
GUIDEWIRE ORTHO 0.054X6 (WIRE) ×9 IMPLANT
NEEDLE HYPO 25X1 1.5 SAFETY (NEEDLE) ×3 IMPLANT
NS IRRIG 1000ML POUR BTL (IV SOLUTION) ×3 IMPLANT
PACK BASIN DAY SURGERY FS (CUSTOM PROCEDURE TRAY) ×3 IMPLANT
PAD CAST 3X4 CTTN HI CHSV (CAST SUPPLIES) ×2 IMPLANT
PADDING CAST ABS 4INX4YD NS (CAST SUPPLIES) ×1
PADDING CAST ABS COTTON 4X4 ST (CAST SUPPLIES) ×2 IMPLANT
PADDING CAST COTTON 3X4 STRL (CAST SUPPLIES) ×1
PLATE PROXIMAL VDU ACULOC (Plate) ×3 IMPLANT
PLATE STD RT ACULOC 2 (Plate) ×3 IMPLANT
SCREW CORT 24X2.3XNONTOGGLE (Screw) ×2 IMPLANT
SCREW CORT FT 22X2.3XLCK HEX (Screw) ×4 IMPLANT
SCREW CORTICAL 2.3X24 (Screw) ×1 IMPLANT
SCREW CORTICAL LOCKING 2.3X20M (Screw) ×3 IMPLANT
SCREW CORTICAL LOCKING 2.3X22M (Screw) ×7 IMPLANT
SCREW FX20X2.3XSMTH LCK NS CRT (Screw) ×6 IMPLANT
SCREW FX22X2.3XLCK SMTH NS CRT (Screw) ×10 IMPLANT
SCREW HEX 3.5X15 NLCKG STRL (Screw) ×2 IMPLANT
SCREW HEX 3.5X15MM (Screw) ×3 IMPLANT
SCREW HEXALOBE NON-LOCK 3.5X14 (Screw) ×6 IMPLANT
SCREW NONLOCK HEX 3.5X12 (Screw) ×3 IMPLANT
SLEEVE SCD COMPRESS KNEE MED (MISCELLANEOUS) ×3 IMPLANT
STOCKINETTE 4X48 STRL (DRAPES) ×3 IMPLANT
SUT ETHILON 4 0 PS 2 18 (SUTURE) ×3 IMPLANT
SUT VICRYL 4-0 PS2 18IN ABS (SUTURE) ×3 IMPLANT
SYR BULB 3OZ (MISCELLANEOUS) ×3 IMPLANT
SYR CONTROL 10ML LL (SYRINGE) ×3 IMPLANT
TOWEL OR 17X24 6PK STRL BLUE (TOWEL DISPOSABLE) ×6 IMPLANT
TOWEL OR NON WOVEN STRL DISP B (DISPOSABLE) ×3 IMPLANT
UNDERPAD 30X30 (UNDERPADS AND DIAPERS) ×3 IMPLANT

## 2016-10-06 NOTE — Progress Notes (Signed)
Assisted Dr. Germeroth with right, ultrasound guided, axillary block. Side rails up, monitors on throughout procedure. See vital signs in flow sheet. Tolerated Procedure well. 

## 2016-10-06 NOTE — Anesthesia Procedure Notes (Signed)
Procedure Name: LMA Insertion Performed by: Zeda Gangwer W Pre-anesthesia Checklist: Patient identified, Emergency Drugs available, Suction available and Patient being monitored Patient Re-evaluated:Patient Re-evaluated prior to induction Oxygen Delivery Method: Circle system utilized Preoxygenation: Pre-oxygenation with 100% oxygen Induction Type: IV induction Ventilation: Mask ventilation without difficulty LMA: LMA inserted LMA Size: 5.0 Number of attempts: 1 Placement Confirmation: positive ETCO2 Tube secured with: Tape Dental Injury: Teeth and Oropharynx as per pre-operative assessment        

## 2016-10-06 NOTE — Op Note (Signed)
I assisted Surgeon(s) and Role:    * Betha Loa, MD - Primary    Cindee Salt, MD - Assisting on the Procedure(s): OPEN REDUCTION INTERNAL FIXATION (ORIF) RIGHT DISTAL RADIUS CARPAL TUNNEL RELEASE on 10/06/2016.  I provided assistance on this case as follows:setup, approach, carpal tunnel release, fracture debridement, reduction, stabilization, application of the plate and screws, closure of the wound, application of the dressings and splint. I was present for the entire case.  Electronically signed by: Nicki Reaper, MD Date: 10/06/2016 Time: 4:32 PM

## 2016-10-06 NOTE — H&P (Signed)
  Eric Hodge is an 31 y.o. male.   Chief Complaint: right wrist fracture HPI: 31 yo male states he fell from trailer during training injuring right wrist.  Seen at ED where XR revealed distal radius fracture that was reduced.  He wishes to proceed with operative fixation.  Allergies: No Known Allergies  Past Medical History:  Diagnosis Date  . 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 Distal radius fracture, right 09/2016    Past Surgical History:  Procedure Laterality Date  . FOREIGN BODY REMOVAL  09/08/2011   Procedure: REMOVAL FOREIGN BODY EXTREMITY;  Surgeon: Tami Ribas, MD;  Location: [facility];  Service: Orthopedics;  Laterality: Left;  FOREIGN BODY REMOVAL LEFT LONG FINGER  . MASS EXCISION Left 10/29/2015   Procedure: LEFT WRIST EXCISION MASS;  Surgeon: Betha Loa, MD;  Location:  SURGERY CENTER;  Service: Orthopedics;  Laterality: Left;  LEFT WRIST EXCISION MASS  . WISDOM TOOTH EXTRACTION  2012    Family History: Family History  Problem Relation Age of Onset  . Stroke Paternal Grandmother   . Hypertension Paternal Grandmother   . Cancer Paternal Grandfather   . Hyperlipidemia Paternal Grandfather     Social History:   reports that he has been smoking Cigarettes.  He has a 5.00 pack-year smoking history. He has never used smokeless tobacco. He reports that he drinks alcohol. He reports that he does not use drugs.  Medications: Medications Prior to Admission  Medication Sig Dispense Refill  . diclofenac (VOLTAREN) 75 MG EC tablet Take 75 mg by mouth as needed. Knee pain    . HYDROcodone-acetaminophen (NORCO) 5-325 MG tablet 1-2 tabs po q6 hours prn pain 20 tablet 0  . ibuprofen (ADVIL,MOTRIN) 200 MG tablet Take 200 mg by mouth every 6 (six) hours as needed.      No results found for this or any previous visit (from the past 48 hour(s)).  No results found.   A comprehensive review of systems was negative.  Blood pressure  121/78, pulse 78, temperature 98.6 F (37 C), temperature source Oral, resp. rate 16, height  (1.803 m), weight 108 kg (238 lb), SpO2 100 %.  General appearance: alert, cooperative and appears stated age Head: Normocephalic, without obvious abnormality, atraumatic Neck: supple, symmetrical, trachea midline Resp: clear to auscultation bilaterally Cardio: regular rate and rhythm GI: non-tender Extremities: Intact sensation and capillary refill all digits.  +epl/fpl/io.  No wounds.  Pulses: 2+ and symmetric Skin: Skin color, texture, turgor normal. No rashes or lesions Neurologic: Grossly normal Incision/Wound:none  Assessment/Plan Right distal radius fracture.  Non operative and operative treatment options were discussed with the patient and patient wishes to proceed with operative treatment. Risks, benefits, and alternatives of surgery were discussed and the patient agrees with the plan of care.   Jalaila Caradonna R 10/06/2016, 1:45 PM

## 2016-10-06 NOTE — Brief Op Note (Signed)
10/06/2016  4:31 PM  PATIENT:  Eric Hodge  31 y.o. male  PRE-OPERATIVE DIAGNOSIS:  RIGHT DISTAL RADIUS FRACTURE AND MEDIAN NERVE COMPRESSION  POST-OPERATIVE DIAGNOSIS:  RIGHT DISTAL RADIUS FRACTURE AND MEDIAN NERVE COMPRESSION  PROCEDURE:  Procedure(s): OPEN REDUCTION INTERNAL FIXATION (ORIF) RIGHT DISTAL RADIUS (Right) CARPAL TUNNEL RELEASE (Right)  SURGEON:  Surgeon(s) and Role:    * Betha Loa, MD - Primary    * Cindee Salt, MD - Assisting  PHYSICIAN ASSISTANT:   ASSISTANTS: Cindee Salt, MD   ANESTHESIA:   regional and general  EBL:  Total I/O In: 1000 [I.V.:1000] Out: 10 [Blood:10]  BLOOD ADMINISTERED:none  DRAINS: none   LOCAL MEDICATIONS USED:  NONE  SPECIMEN:  No Specimen  DISPOSITION OF SPECIMEN:  N/A  COUNTS:  YES  TOURNIQUET:   Total Tourniquet Time Documented: Upper Arm (Right) - 121 minutes Total: Upper Arm (Right) - 121 minutes   DICTATION: .Note written in EPIC  PLAN OF CARE: Discharge to home after PACU  PATIENT DISPOSITION:  PACU - hemodynamically stable.

## 2016-10-06 NOTE — Transfer of Care (Signed)
Immediate Anesthesia Transfer of Care Note  Patient: Consuello Bossier  Procedure(s) Performed: Procedure(s): OPEN REDUCTION INTERNAL FIXATION (ORIF) RIGHT DISTAL RADIUS (Right) CARPAL TUNNEL RELEASE (Right)  Patient Location: PACU  Anesthesia Type:General and Regional  Level of Consciousness: awake and sedated  Airway & Oxygen Therapy: Patient Spontanous Breathing and Patient connected to face mask oxygen  Post-op Assessment: Report given to RN and Post -op Vital signs reviewed and stable  Post vital signs: Reviewed and stable  Last Vitals:  Vitals:   10/06/16 1318 10/06/16 1637  BP:    Pulse: 78 97  Resp: 16 (!) 34  Temp:    SpO2: 100% 94%    Last Pain:  Vitals:   10/06/16 1149  TempSrc: Oral  PainSc: 1          Complications: No apparent anesthesia complications

## 2016-10-06 NOTE — Anesthesia Postprocedure Evaluation (Signed)
Anesthesia Post Note  Patient: Eric Hodge  Procedure(s) Performed: Procedure(s) (LRB): OPEN REDUCTION INTERNAL FIXATION (ORIF) RIGHT DISTAL RADIUS (Right) CARPAL TUNNEL RELEASE (Right)     Patient location during evaluation: PACU Anesthesia Type: General Level of consciousness: sedated and patient cooperative Pain management: pain level controlled Vital Signs Assessment: post-procedure vital signs reviewed and stable Respiratory status: spontaneous breathing Cardiovascular status: stable Anesthetic complications: no Comments: Converted to GA for airway obstruction and patient moving    Last Vitals:  Vitals:   10/06/16 1645 10/06/16 1700  BP: 118/75 115/71  Pulse: 83 87  Resp: 20 17  Temp:    SpO2: 96% 95%    Last Pain:  Vitals:   10/06/16 1700  TempSrc:   PainSc: 0-No pain                 Lewie Loron

## 2016-10-06 NOTE — Anesthesia Preprocedure Evaluation (Signed)
Anesthesia Evaluation  Patient identified by MRN, date of birth, ID band Patient awake    Reviewed: Allergy & Precautions, NPO status , Patient's Chart, lab work & pertinent test results  Airway Mallampati: I  TM Distance: >3 FB Neck ROM: Full    Dental  (+) Teeth Intact, Dental Advisory Given   Pulmonary Current Smoker,    breath sounds clear to auscultation       Cardiovascular negative cardio ROS   Rhythm:Regular Rate:Normal     Neuro/Psych negative neurological ROS  negative psych ROS   GI/Hepatic negative GI ROS, Neg liver ROS,   Endo/Other    Renal/GU negative Renal ROS     Musculoskeletal negative musculoskeletal ROS (+)   Abdominal   Peds  Hematology negative hematology ROS (+)   Anesthesia Other Findings   Reproductive/Obstetrics negative OB ROS                             Anesthesia Physical  Anesthesia Plan  ASA: II  Anesthesia Plan: Regional   Post-op Pain Management:    Induction:   PONV Risk Score and Plan: 0 and Ondansetron, Propofol infusion and Midazolam  Airway Management Planned:   Additional Equipment:   Intra-op Plan:   Post-operative Plan:   Informed Consent: I have reviewed the patients History and Physical, chart, labs and discussed the procedure including the risks, benefits and alternatives for the proposed anesthesia with the patient or authorized representative who has indicated his/her understanding and acceptance.   Dental advisory given  Plan Discussed with: CRNA  Anesthesia Plan Comments:         Anesthesia Quick Evaluation

## 2016-10-06 NOTE — Op Note (Signed)
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 ORIF right comminuted intraarticular distal radius fracture, greater than three intraarticular fragments 2. Right carpal tunnel release through separate incision  Surgeon: Betha Loa, MD  Assistant: Cindee Salt, MD  Anesthesia: General and Regional  Fluids: Per anesthesia flow sheet  EBL: minimal  Complications: None  Specimen: None  Tourniquet Time:  Total Tourniquet Time Documented: Upper Arm (Right) - 121 minutes Total: Upper Arm (Right) - 121 minutes   Disposition: Stable to PACU  INDICATIONS:  Sevon Rotert is a 31 y.o. male who fell from a trailer approximately two weeks ago injuring his right wrist.  Seen at ED where XR revealed distal radius fracture which was reduced by local staff.  He has had tingling in the fingers.  We discussed nonoperative and operative treatment options.  He wished to proceed with operative fixation and carpal tunnel release.  Risks, benefits, and alternatives of surgery were discussed including the risk of blood loss; infection; damage to nerves, vessels, tendons, ligaments, bone; failure of surgery; need for additional surgery; complications with wound healing; continued pain; nonunion; malunion; stiffness.  We also discussed the possible need for bone graft and the benefits and risks including the possibility of disease transmission.  He voiced understanding of these risks and elected to proceed.   OPERATIVE COURSE:  After being identified preoperatively by myself, the patient and I agreed upon the procedure and site of procedure.  Surgical site was marked.  The risks, benefits and alternatives of the surgery were reviewed and he  wished to proceed.  Surgical consent had been signed.  He was given IV Ancef as preoperative antibiotic prophylaxis.  He was transferred to the operating room and placed on the operating room table in supine position with the right upper extremity on an armboard. Regional anesthesia was induced by the anesthesiologist.  During the case, general anesthesia was induced.  The right upper extremity was prepped and draped in normal sterile orthopedic fashion.  A surgical pause was performed between the surgeons, anesthesia and operating room staff, and all were in agreement as to the patient, procedure and site of procedure.  Tourniquet at the proximal aspect of the extremity was inflated to 250 mmHg after exsanguination of the limb with an Esmarch bandage.   Incision was made over the transverse carpal ligament and carried into the subcutaneous tissues by spreading technique.  Bipolar electrocautery was used to obtain hemostasis.  The palmar fascia was sharply incised.  The transverse carpal ligament was identified and sharply incised.  It was incised distally first.  Care was taken to ensure complete decompression distally.  It was then incised proximally.  Scissors were used to split the distal aspect of the volar antebrachial fascia.  A finger was placed into the wound to ensure complete decompression, which was the case.  The nerve was examined.  It was hyperemic.  There was a persistent median artery.  The motor branch was identified and was intact. The wound was copiously irrigated with sterile saline and later closed with 4-0 nylon in a horizontal mattress fashion.  Attention was turned to the fracture.  Standard volar Sherilyn Cooter approach was used.  The bipolar electrocautery was used to obtain hemostasis.  The superficial and deep portions of the  FCR tendon sheath were incised, and the FCR and FPL were swept ulnarly to protect the palmar cutaneous branch of the median nerve.  The brachioradialis was released at the  radial side of the radius.  The pronator quadratus was released and elevated with the periosteal elevator.  The fracture site was identified and cleared of soft tissue interposition and hematoma.  It was reduced under direct visualization.  There was intraarticular comminution creating three intraarticular fragments.  The fracture was difficult to reduce and required release of the dorsal periosteum.   An AcuMed volar distal radial locking plate was selected.  It was secured to the bone with the guidepins.  C-arm was used in AP and lateral projections to ensure appropriate reduction and position of the hardware and adjustments made as necessary.  Standard AO drilling and measuring technique was used.  A single screw was placed in the slotted hole in the shaft of the plate.  The distal holes were filled with locking pegs with the exception of the styloid holes, which were filled with locking screws.  The remaining holes in the shaft of the plate were filled with nonlocking screws.  Good purchase was obtained.  C-arm was used in AP, lateral and oblique projections to ensure appropriate reduction and position of hardware.  There was some loss of inclination and volar tilt.  There was no intra-articular penetration of hardware.  The wound was copiously irrigated with sterile saline.  Pronator quadratus was repaired back over top of the plate using 4-0 Vicryl suture.  Vicryl suture was placed in the subcutaneous tissues in an inverted interrupted fashion and the skin was closed with 4-0 nylon in a horizontal mattress fashion.  There was good pronation and supination of the wrist without crepitance.  The wound was then dressed with sterile Xeroform, 4x4s, and wrapped with a Kerlix bandage.  A volar splint was placed and wrapped with Kerlix and Ace bandage.  Tourniquet was deflated at 121 minutes.  Fingertips were pink with brisk capillary refill after deflation of the tourniquet.  Operative drapes were broken down.  The  patient was awoken from anesthesia safely.  He was transferred back to the stretcher and taken to the PACU in stable condition.  I will see him back in the office in one week for postoperative followup.  I will give him a prescription for percocet 5/325 1-2 tabs PO q6 hours prn pain, dispense #30.    Tami Ribas, MD Electronically signed, 10/06/16

## 2016-10-06 NOTE — Anesthesia Procedure Notes (Signed)
Procedure Name: MAC Performed by: Terrance Mass Pre-anesthesia Checklist: Patient identified, Timeout performed, Emergency Drugs available, Suction available and Patient being monitored Oxygen Delivery Method: Simple face mask Placement Confirmation: positive ETCO2

## 2016-10-06 NOTE — Anesthesia Procedure Notes (Addendum)
Anesthesia Regional Block: Axillary brachial plexus block   Pre-Anesthetic Checklist: ,, timeout performed, Correct Patient, Correct Site, Correct Laterality, Correct Procedure, Correct Position, site marked, Risks and benefits discussed,  Surgical consent,  Pre-op evaluation,  At surgeon's request and post-op pain management  Laterality: Right  Prep: chloraprep       Needles:  Injection technique: Single-shot  Needle Type: Stimiplex          Additional Needles:   Procedures:,,,, ultrasound used (permanent image in chart),,,,  Narrative:  Start time: 10/06/2016 1:15 PM End time: 10/06/2016 1:20 PM Injection made incrementally with aspirations every 5 mL.  Performed by: Personally  Anesthesiologist: Lewie Loron  Additional Notes: Risks, benefits and alternative to block explained extensively.  Patient tolerated procedure well, without complications.

## 2016-10-06 NOTE — Discharge Instructions (Signed)
Post Anesthesia Home Care Instructions  Activity: Get plenty of rest for the remainder of the day. A responsible individual must stay with you for 24 hours following the procedure.  For the next 24 hours, DO NOT: -Drive a car -Operate machinery -Drink alcoholic beverages -Take any medication unless instructed by your physician -Make any legal decisions or sign important papers.  Meals: Start with liquid foods such as gelatin or soup. Progress to regular foods as tolerated. Avoid greasy, spicy, heavy foods. If nausea and/or vomiting occur, drink only clear liquids until the nausea and/or vomiting subsides. Call your physician if vomiting continues.  Special Instructions/Symptoms: Your throat may feel dry or sore from the anesthesia or the breathing tube placed in your throat during surgery. If this causes discomfort, gargle with warm salt water. The discomfort should disappear within 24 hours.  If you had a scopolamine patch placed behind your ear for the management of post- operative nausea and/or vomiting:  1. The medication in the patch is effective for 72 hours, after which it should be removed.  Wrap patch in a tissue and discard in the trash. Wash hands thoroughly with soap and water. 2. You may remove the patch earlier than 72 hours if you experience unpleasant side effects which may include dry mouth, dizziness or visual disturbances. 3. Avoid touching the patch. Wash your hands with soap and water after contact with the patch.      Regional Anesthesia Blocks  1. Numbness or the inability to move the "blocked" extremity may last from 3-48 hours after placement. The length of time depends on the medication injected and your individual response to the medication. If the numbness is not going away after 48 hours, call your surgeon.  2. The extremity that is blocked will need to be protected until the numbness is gone and the  Strength has returned. Because you cannot feel it, you  will need to take extra care to avoid injury. Because it may be weak, you may have difficulty moving it or using it. You may not know what position it is in without looking at it while the block is in effect.  3. For blocks in the legs and feet, returning to weight bearing and walking needs to be done carefully. You will need to wait until the numbness is entirely gone and the strength has returned. You should be able to move your leg and foot normally before you try and bear weight or walk. You will need someone to be with you when you first try to ensure you do not fall and possibly risk injury.  4. Bruising and tenderness at the needle site are common side effects and will resolve in a few days.  5. Persistent numbness or new problems with movement should be communicated to the surgeon or the [facility] ([phone])/ [facility] ([phone]).    [facility] Center Instructions Hand Surgery  Wound Care: Keep your hand elevated above the level of your heart.  Do not allow it to dangle by your side.  Keep the dressing dry and do not remove it unless your doctor advises you to do so.  He will usually change it at the time of your post-op visit.  Moving your fingers is advised to stimulate circulation but will depend on the site of your surgery.  If you have a splint applied, your doctor will advise you regarding movement.  Activity: Do not drive or operate machinery today.  Rest today and   then you may return to your normal activity and work as indicated by your physician.  Diet:  Drink liquids today or eat a light diet.  You may resume a regular diet tomorrow.    General expectations: Pain for two to three days. Fingers may become slightly swollen.  Call your doctor if any of the following occur: Severe pain not relieved by pain medication. Elevated temperature. Dressing soaked with blood. Inability to move fingers. White or bluish color to fingers.  

## 2016-10-07 ENCOUNTER — Encounter (HOSPITAL_BASED_OUTPATIENT_CLINIC_OR_DEPARTMENT_OTHER): Payer: Self-pay | Admitting: Orthopedic Surgery

## 2016-11-09 NOTE — Addendum Note (Signed)
Addendum  created 11/09/16 0711 by Lewie LoronGermeroth, Latrell Potempa, MD   Anesthesia Intra Blocks edited, Sign clinical note

## 2016-11-11 DIAGNOSIS — J069 Acute upper respiratory infection, unspecified: Secondary | ICD-10-CM | POA: Diagnosis not present

## 2016-12-21 DIAGNOSIS — Z719 Counseling, unspecified: Secondary | ICD-10-CM | POA: Diagnosis not present

## 2017-02-17 DIAGNOSIS — Z969 Presence of functional implant, unspecified: Secondary | ICD-10-CM

## 2017-02-17 HISTORY — DX: Presence of functional implant, unspecified: Z96.9

## 2017-03-06 ENCOUNTER — Other Ambulatory Visit: Payer: Self-pay | Admitting: Orthopedic Surgery

## 2017-03-06 ENCOUNTER — Encounter (HOSPITAL_BASED_OUTPATIENT_CLINIC_OR_DEPARTMENT_OTHER): Payer: Self-pay | Admitting: *Deleted

## 2017-03-06 ENCOUNTER — Other Ambulatory Visit: Payer: Self-pay

## 2017-03-06 DIAGNOSIS — R0989 Other specified symptoms and signs involving the circulatory and respiratory systems: Secondary | ICD-10-CM

## 2017-03-06 HISTORY — DX: Other specified symptoms and signs involving the circulatory and respiratory systems: R09.89

## 2017-03-10 ENCOUNTER — Ambulatory Visit (HOSPITAL_BASED_OUTPATIENT_CLINIC_OR_DEPARTMENT_OTHER): Admitting: Anesthesiology

## 2017-03-10 ENCOUNTER — Encounter (HOSPITAL_BASED_OUTPATIENT_CLINIC_OR_DEPARTMENT_OTHER)
Admission: RE | Disposition: A | Discharge status: Home / Self Care | Payer: Self-pay | Source: Ambulatory Visit | Attending: Orthopedic Surgery

## 2017-03-10 ENCOUNTER — Ambulatory Visit (HOSPITAL_BASED_OUTPATIENT_CLINIC_OR_DEPARTMENT_OTHER)
Admission: RE | Admit: 2017-03-10 | Discharge: 2017-03-10 | Disposition: A | Discharge status: Home / Self Care | Source: Ambulatory Visit | Attending: Orthopedic Surgery | Admitting: Orthopedic Surgery

## 2017-03-10 ENCOUNTER — Encounter (HOSPITAL_BASED_OUTPATIENT_CLINIC_OR_DEPARTMENT_OTHER): Payer: Self-pay | Admitting: Anesthesiology

## 2017-03-10 ENCOUNTER — Other Ambulatory Visit: Payer: Self-pay

## 2017-03-10 DIAGNOSIS — Y838 Other surgical procedures as the cause of abnormal reaction of the patient, or of later complication, without mention of misadventure at the time of the procedure: Secondary | ICD-10-CM | POA: Insufficient documentation

## 2017-03-10 DIAGNOSIS — E785 Hyperlipidemia, unspecified: Secondary | ICD-10-CM | POA: Diagnosis not present

## 2017-03-10 DIAGNOSIS — T8484XA Pain due to internal orthopedic prosthetic devices, implants and grafts, initial encounter: Secondary | ICD-10-CM | POA: Insufficient documentation

## 2017-03-10 DIAGNOSIS — F17210 Nicotine dependence, cigarettes, uncomplicated: Secondary | ICD-10-CM | POA: Diagnosis not present

## 2017-03-10 DIAGNOSIS — S52571D Other intraarticular fracture of lower end of right radius, subsequent encounter for closed fracture with routine healing: Secondary | ICD-10-CM | POA: Diagnosis not present

## 2017-03-10 DIAGNOSIS — Z131 Encounter for screening for diabetes mellitus: Secondary | ICD-10-CM | POA: Diagnosis not present

## 2017-03-10 DIAGNOSIS — G894 Chronic pain syndrome: Secondary | ICD-10-CM | POA: Diagnosis not present

## 2017-03-10 HISTORY — DX: Other specified symptoms and signs involving the circulatory and respiratory systems: R09.89

## 2017-03-10 HISTORY — PX: HARDWARE REMOVAL: SHX979

## 2017-03-10 HISTORY — DX: Presence of functional implant, unspecified: Z96.9

## 2017-03-10 SURGERY — REMOVAL, HARDWARE
Anesthesia: General | Site: Wrist | Laterality: Right

## 2017-03-10 MED ORDER — DEXTROSE 5 % IV SOLN: 3.0000 g | Freq: Once | INTRAVENOUS | Status: DC

## 2017-03-10 MED ORDER — OXYCODONE HCL 5 MG/5ML PO SOLN: 5.0000 mg | Freq: Once | ORAL | Status: AC | PRN

## 2017-03-10 MED ORDER — PROPOFOL 10 MG/ML IV BOLUS
INTRAVENOUS | Status: AC
Start: 2017-03-10 — End: ?
  Filled 2017-03-10: qty 20

## 2017-03-10 MED ORDER — CEFAZOLIN SODIUM-DEXTROSE 2-4 GM/100ML-% IV SOLN
2.0000 g | INTRAVENOUS | Status: DC
  Administered 2017-03-10: 3 g via INTRAVENOUS

## 2017-03-10 MED ORDER — OXYCODONE HCL 5 MG PO TABS
ORAL_TABLET | ORAL | Status: AC
  Filled 2017-03-10: qty 1

## 2017-03-10 MED ORDER — CHLORHEXIDINE GLUCONATE 4 % EX LIQD: 60.0000 mL | Freq: Once | CUTANEOUS | Status: DC

## 2017-03-10 MED ORDER — FENTANYL CITRATE (PF) 100 MCG/2ML IJ SOLN
INTRAMUSCULAR | Status: AC
  Filled 2017-03-10: qty 2

## 2017-03-10 MED ORDER — OXYCODONE HCL 5 MG PO TABS
5.0000 mg | ORAL_TABLET | Freq: Once | ORAL | Status: AC | PRN
  Administered 2017-03-10: 5 mg via ORAL

## 2017-03-10 MED ORDER — 0.9 % SODIUM CHLORIDE (POUR BTL) OPTIME
TOPICAL | Status: DC | PRN
  Administered 2017-03-10: 1000 mL

## 2017-03-10 MED ORDER — MIDAZOLAM HCL 2 MG/2ML IJ SOLN
1.0000 mg | INTRAMUSCULAR | Status: DC | PRN
  Administered 2017-03-10: 2 mg via INTRAVENOUS

## 2017-03-10 MED ORDER — LIDOCAINE 2% (20 MG/ML) 5 ML SYRINGE
INTRAMUSCULAR | Status: DC | PRN
  Administered 2017-03-10: 50 mg via INTRAVENOUS

## 2017-03-10 MED ORDER — ONDANSETRON HCL 4 MG/2ML IJ SOLN: 4.0000 mg | Freq: Four times a day (QID) | INTRAMUSCULAR | Status: DC | PRN

## 2017-03-10 MED ORDER — DEXAMETHASONE SODIUM PHOSPHATE 10 MG/ML IJ SOLN
INTRAMUSCULAR | Status: AC
  Filled 2017-03-10: qty 1

## 2017-03-10 MED ORDER — PROPOFOL 10 MG/ML IV BOLUS
INTRAVENOUS | Status: AC
  Filled 2017-03-10: qty 20

## 2017-03-10 MED ORDER — FENTANYL CITRATE (PF) 100 MCG/2ML IJ SOLN
50.0000 ug | INTRAMUSCULAR | Status: DC | PRN
  Administered 2017-03-10: 50 ug via INTRAVENOUS
  Administered 2017-03-10: 100 ug via INTRAVENOUS

## 2017-03-10 MED ORDER — CEFAZOLIN SODIUM-DEXTROSE 2-4 GM/100ML-% IV SOLN
INTRAVENOUS | Status: AC
  Filled 2017-03-10: qty 100

## 2017-03-10 MED ORDER — MIDAZOLAM HCL 2 MG/2ML IJ SOLN
INTRAMUSCULAR | Status: AC
  Filled 2017-03-10: qty 2

## 2017-03-10 MED ORDER — ONDANSETRON HCL 4 MG/2ML IJ SOLN
INTRAMUSCULAR | Status: AC
  Filled 2017-03-10: qty 2

## 2017-03-10 MED ORDER — DEXAMETHASONE SODIUM PHOSPHATE 4 MG/ML IJ SOLN
INTRAMUSCULAR | Status: DC | PRN
  Administered 2017-03-10: 10 mg via INTRAVENOUS

## 2017-03-10 MED ORDER — LACTATED RINGERS IV SOLN
INTRAVENOUS | Status: DC
  Administered 2017-03-10: 13:00:00 via INTRAVENOUS

## 2017-03-10 MED ORDER — CEFAZOLIN SODIUM-DEXTROSE 1-4 GM/50ML-% IV SOLN
INTRAVENOUS | Status: AC
  Filled 2017-03-10: qty 50

## 2017-03-10 MED ORDER — HYDROCODONE-ACETAMINOPHEN 5-325 MG PO TABS: ORAL_TABLET | ORAL | 0 refills | Status: DC

## 2017-03-10 MED ORDER — KETOROLAC TROMETHAMINE 30 MG/ML IJ SOLN
INTRAMUSCULAR | Status: DC | PRN
  Administered 2017-03-10: 30 mg via INTRAVENOUS

## 2017-03-10 MED ORDER — LIDOCAINE 2% (20 MG/ML) 5 ML SYRINGE
INTRAMUSCULAR | Status: AC
  Filled 2017-03-10: qty 5

## 2017-03-10 MED ORDER — BUPIVACAINE HCL (PF) 0.25 % IJ SOLN
INTRAMUSCULAR | Status: DC | PRN
  Administered 2017-03-10: 10 mL

## 2017-03-10 MED ORDER — FENTANYL CITRATE (PF) 100 MCG/2ML IJ SOLN
25.0000 ug | INTRAMUSCULAR | Status: DC | PRN
  Administered 2017-03-10 (×2): 50 ug via INTRAVENOUS

## 2017-03-10 MED ORDER — PROPOFOL 10 MG/ML IV BOLUS
INTRAVENOUS | Status: DC | PRN
  Administered 2017-03-10: 230 mg via INTRAVENOUS

## 2017-03-10 MED ORDER — SCOPOLAMINE 1 MG/3DAYS TD PT72: 1.0000 | MEDICATED_PATCH | Freq: Once | TRANSDERMAL | Status: DC | PRN

## 2017-03-10 SURGICAL SUPPLY — 78 items
BANDAGE ACE 3X5.8 VEL STRL LF (GAUZE/BANDAGES/DRESSINGS) ×3 IMPLANT
BLADE MINI RND TIP GREEN BEAV (BLADE) IMPLANT
BLADE SURG 15 STRL LF DISP TIS (BLADE) ×4 IMPLANT
BLADE SURG 15 STRL SS (BLADE) ×2
BNDG COHESIVE 1X5 TAN STRL LF (GAUZE/BANDAGES/DRESSINGS) IMPLANT
BNDG CONFORM 2 STRL LF (GAUZE/BANDAGES/DRESSINGS) IMPLANT
BNDG ELASTIC 2X5.8 VLCR STR LF (GAUZE/BANDAGES/DRESSINGS) IMPLANT
BNDG ESMARK 4X9 LF (GAUZE/BANDAGES/DRESSINGS) ×3 IMPLANT
BNDG GAUZE ELAST 4 BULKY (GAUZE/BANDAGES/DRESSINGS) ×3 IMPLANT
BNDG PLASTER X FAST 3X3 WHT LF (CAST SUPPLIES) ×30 IMPLANT
CATH ROBINSON RED A/P 10FR (CATHETERS) IMPLANT
CHLORAPREP W/TINT 26ML (MISCELLANEOUS) ×3 IMPLANT
CORD BIPOLAR FORCEPS 12FT (ELECTRODE) ×3 IMPLANT
COTTONBALL LRG STERILE PKG (GAUZE/BANDAGES/DRESSINGS) IMPLANT
COVER BACK TABLE 60X90IN (DRAPES) ×3 IMPLANT
COVER MAYO STAND STRL (DRAPES) ×3 IMPLANT
CUFF TOURNIQUET SINGLE 18IN (TOURNIQUET CUFF) ×3 IMPLANT
DRAPE EXTREMITY T 121X128X90 (DRAPE) ×3 IMPLANT
DRAPE OEC MINIVIEW 54X84 (DRAPES) IMPLANT
DRAPE SURG 17X23 STRL (DRAPES) ×3 IMPLANT
DRSG PAD ABDOMINAL 8X10 ST (GAUZE/BANDAGES/DRESSINGS) ×3 IMPLANT
GAUZE SPONGE 4X4 12PLY STRL (GAUZE/BANDAGES/DRESSINGS) ×3 IMPLANT
GAUZE SPONGE 4X4 16PLY XRAY LF (GAUZE/BANDAGES/DRESSINGS) IMPLANT
GAUZE XEROFORM 1X8 LF (GAUZE/BANDAGES/DRESSINGS) ×3 IMPLANT
GLOVE BIO SURGEON STRL SZ7.5 (GLOVE) ×3 IMPLANT
GLOVE BIOGEL PI IND STRL 8 (GLOVE) ×2 IMPLANT
GLOVE BIOGEL PI INDICATOR 8 (GLOVE) ×1
GLOVE SURG ORTHO 8.0 STRL STRW (GLOVE) IMPLANT
GOWN STRL REUS W/ TWL LRG LVL3 (GOWN DISPOSABLE) ×2 IMPLANT
GOWN STRL REUS W/TWL LRG LVL3 (GOWN DISPOSABLE) ×1
GOWN STRL REUS W/TWL XL LVL3 (GOWN DISPOSABLE) ×3 IMPLANT
K-WIRE .035X4 (WIRE) IMPLANT
NEEDLE HYPO 25X1 1.5 SAFETY (NEEDLE) IMPLANT
NEEDLE KEITH (NEEDLE) IMPLANT
NS IRRIG 1000ML POUR BTL (IV SOLUTION) ×3 IMPLANT
PACK BASIN DAY SURGERY FS (CUSTOM PROCEDURE TRAY) ×3 IMPLANT
PAD CAST 3X4 CTTN HI CHSV (CAST SUPPLIES) ×2 IMPLANT
PAD CAST 4YDX4 CTTN HI CHSV (CAST SUPPLIES) IMPLANT
PADDING CAST ABS 3INX4YD NS (CAST SUPPLIES)
PADDING CAST ABS 4INX4YD NS (CAST SUPPLIES) ×1
PADDING CAST ABS COTTON 3X4 (CAST SUPPLIES) IMPLANT
PADDING CAST ABS COTTON 4X4 ST (CAST SUPPLIES) ×2 IMPLANT
PADDING CAST COTTON 3X4 STRL (CAST SUPPLIES) ×1
PADDING CAST COTTON 4X4 STRL (CAST SUPPLIES)
SLEEVE SCD COMPRESS KNEE MED (MISCELLANEOUS) ×3 IMPLANT
SPLINT PLASTER CAST XFAST 3X15 (CAST SUPPLIES) IMPLANT
SPLINT PLASTER XTRA FASTSET 3X (CAST SUPPLIES)
STOCKINETTE 4X48 STRL (DRAPES) ×3 IMPLANT
STRIP CLOSURE SKIN 1/4X4 (GAUZE/BANDAGES/DRESSINGS) IMPLANT
SUT CHROMIC 5 0 P 3 (SUTURE) IMPLANT
SUT ETHIBOND 3-0 V-5 (SUTURE) IMPLANT
SUT ETHILON 3 0 PS 1 (SUTURE) IMPLANT
SUT ETHILON 4 0 PS 2 18 (SUTURE) ×6 IMPLANT
SUT FIBERWIRE 3-0 18 TAPR NDL (SUTURE)
SUT FIBERWIRE 4-0 18 DIAM BLUE (SUTURE)
SUT MERSILENE 2.0 SH NDLE (SUTURE) IMPLANT
SUT MERSILENE 3 0 FS 1 (SUTURE) IMPLANT
SUT MERSILENE 4 0 P 3 (SUTURE) IMPLANT
SUT MNCRL AB 4-0 PS2 18 (SUTURE) IMPLANT
SUT MON AB 5-0 PS2 18 (SUTURE) IMPLANT
SUT PROLENE 2 0 SH DA (SUTURE) IMPLANT
SUT PROLENE 6 0 P 1 18 (SUTURE) IMPLANT
SUT SILK 2 0 PERMA HAND 18 BK (SUTURE) IMPLANT
SUT SILK 4 0 PS 2 (SUTURE) IMPLANT
SUT SUPRAMID 4-0 (SUTURE) IMPLANT
SUT VIC AB 3-0 FS2 27 (SUTURE) IMPLANT
SUT VIC AB 3-0 PS1 18 (SUTURE)
SUT VIC AB 3-0 PS1 18XBRD (SUTURE) IMPLANT
SUT VIC AB 4-0 P-3 18XBRD (SUTURE) ×2 IMPLANT
SUT VIC AB 4-0 P3 18 (SUTURE) ×1
SUT VICRYL 4-0 PS2 18IN ABS (SUTURE) IMPLANT
SUTURE FIBERWR 3-0 18 TAPR NDL (SUTURE) IMPLANT
SUTURE FIBERWR 4-0 18 DIA BLUE (SUTURE) IMPLANT
SYR BULB 3OZ (MISCELLANEOUS) ×3 IMPLANT
SYR CONTROL 10ML LL (SYRINGE) ×3 IMPLANT
TOWEL OR 17X24 6PK STRL BLUE (TOWEL DISPOSABLE) ×6 IMPLANT
TUBE FEEDING ENTERAL 5FR 16IN (TUBING) IMPLANT
UNDERPAD 30X30 (UNDERPADS AND DIAPERS) ×3 IMPLANT

## 2017-03-10 NOTE — Transfer of Care (Signed)
Immediate Anesthesia Transfer of Care Note  Patient: Eric Hodge  Procedure(s) Performed: HARDWARE REMOVAL RIGHT WRIST (Right Wrist)  Patient Location: PACU  Anesthesia Type:General  Level of Consciousness: awake and sedated  Airway & Oxygen Therapy: Patient Spontanous Breathing and Patient connected to face mask oxygen  Post-op Assessment: Report given to RN and Post -op Vital signs reviewed and stable  Post vital signs: Reviewed and stable  Last Vitals:  Vitals:   03/10/17 1153  BP: (!) 105/59  Pulse: 82  Resp: 20  Temp: 36.8 C  SpO2: 98%    Last Pain:  Vitals:   03/10/17 1153  TempSrc: Oral  PainSc: 3       Patients Stated Pain Goal: 3 (03/10/17 1153)  Complications: No apparent anesthesia complications

## 2017-03-10 NOTE — Op Note (Signed)
[id number] 

## 2017-03-10 NOTE — Brief Op Note (Signed)
03/10/2017  2:37 PM  PATIENT:  Eric BossierNorbert Hodge  32 y.o. male  PRE-OPERATIVE DIAGNOSIS:  RIGHT DISTAL RADIUS RETAINED HARDWARE  POST-OPERATIVE DIAGNOSIS:  RIGHT DISTAL RADIUS RETAINED HARDWARE  PROCEDURE:  Procedure(s): HARDWARE REMOVAL RIGHT WRIST (Right)  SURGEON:  Surgeon(s) and Role:    Betha Loa* Anajah Sterbenz, MD - Primary  PHYSICIAN ASSISTANT:   ASSISTANTS: none   ANESTHESIA:   general  EBL:  5 mL   BLOOD ADMINISTERED:none  DRAINS: none   LOCAL MEDICATIONS USED:  MARCAINE     SPECIMEN:  No Specimen  DISPOSITION OF SPECIMEN:  N/A  COUNTS:  YES  TOURNIQUET:  * Missing tourniquet times found for documented tourniquets in log: [id number] *  DICTATION: .Other Dictation: Dictation Number [id number]  PLAN OF CARE: Discharge to home after PACU  PATIENT DISPOSITION:  PACU - hemodynamically stable.

## 2017-03-10 NOTE — Discharge Instructions (Addendum)

## 2017-03-10 NOTE — H&P (Signed)
  Eric Hodge is an 32 y.o. male.   Chief Complaint: retained hardware HPI: 32 yo male s/p ORIF right distal radius fracture with painful retained hardware.  He wishes to have the hardware removed.  Allergies: No Known Allergies  Past Medical History:  Diagnosis Date  . Retained orthopedic hardware 02/2017   right distal radius  . Runny nose 03/06/2017   clear drainage, per pt.    Past Surgical History:  Procedure Laterality Date  . CARPAL TUNNEL RELEASE Right 10/06/2016   Procedure: CARPAL TUNNEL RELEASE;  Surgeon: Betha LoaKuzma, Ceil Roderick, MD;  Location: [facility];  Service: Orthopedics;  Laterality: Right;  . FOREIGN BODY REMOVAL  09/08/2011   Procedure: REMOVAL FOREIGN BODY EXTREMITY;  Surgeon: Tami RibasKevin Hodge Makaya Juneau, MD;  Location: [facility];  Service: Orthopedics;  Laterality: Left;  FOREIGN BODY REMOVAL LEFT LONG FINGER  . MASS EXCISION Left 10/29/2015   Procedure: LEFT WRIST EXCISION MASS;  Surgeon: Betha LoaKevin Lalaine Overstreet, MD;  Location: [facility];  Service: Orthopedics;  Laterality: Left;  LEFT WRIST EXCISION MASS  . OPEN REDUCTION INTERNAL FIXATION (ORIF) DISTAL RADIAL FRACTURE Right 10/06/2016   Procedure: OPEN REDUCTION INTERNAL FIXATION (ORIF) RIGHT DISTAL RADIUS;  Surgeon: Betha LoaKuzma, Taim Wurm, MD;  Location:  SURGERY CENTER;  Service: Orthopedics;  Laterality: Right;  . WISDOM TOOTH EXTRACTION  2012    Family History: Family History  Problem Relation Age of Onset  . Stroke Paternal Grandmother   . Hypertension Paternal Grandmother   . Cancer Paternal Grandfather   . Hyperlipidemia Paternal Grandfather     Social History:   reports that he has been smoking cigarettes.  He has a 5.00 pack-year smoking history. he has never used smokeless tobacco. He reports that he drinks alcohol. He reports that he does not use drugs.  Medications: Medications Prior to Admission  Medication Sig Dispense Refill  . diclofenac (VOLTAREN) 75 MG EC tablet Take 75  mg by mouth as needed. Knee pain    . HYDROcodone-acetaminophen (NORCO) 10-325 MG tablet Take 1 tablet by mouth every 6 (six) hours as needed.      No results found for this or any previous visit (from the past 48 hour(s)).  No results found.   A comprehensive review of systems was negative.  Blood pressure (!) 105/59, pulse 82, temperature 98.2 F (36.8 C), temperature source Oral, resp. rate 20, height 5\' 11"  (1.803 m), weight 122.5 kg (270 lb), SpO2 98 %.  General appearance: alert, cooperative and appears stated age Head: Normocephalic, without obvious abnormality, atraumatic Neck: supple, symmetrical, trachea midline Cardio: regular rate and rhythm Resp: clear to auscultation bilaterally Extremities: Intact sensation and capillary refill all digits.  +epl/fpl/io.  No wounds.  Pulses: 2+ and symmetric Skin: Skin color, texture, turgor normal. No rashes or lesions Neurologic: Grossly normal Incision/Wound:none  Assessment/Plan Right wrist retained hardware.  Non operative and operative treatment options were discussed with the patient and patient wishes to proceed with operative treatment. Risks, benefits, and alternatives of surgery were discussed and the patient agrees with the plan of care.   Eric Hodge 03/10/2017, 1:17 PM

## 2017-03-10 NOTE — Anesthesia Procedure Notes (Signed)
Procedure Name: LMA Insertion Performed by: Analisia Kingsford W, CRNA Pre-anesthesia Checklist: Patient identified, Emergency Drugs available, Suction available and Patient being monitored Patient Re-evaluated:Patient Re-evaluated prior to induction Oxygen Delivery Method: Circle system utilized Preoxygenation: Pre-oxygenation with 100% oxygen Induction Type: IV induction Ventilation: Mask ventilation without difficulty LMA: LMA inserted LMA Size: 5.0 Number of attempts: 1 Placement Confirmation: positive ETCO2 Tube secured with: Tape Dental Injury: Teeth and Oropharynx as per pre-operative assessment        

## 2017-03-10 NOTE — Anesthesia Postprocedure Evaluation (Signed)
Anesthesia Post Note  Patient: Eric Hodge  Procedure(s) Performed: HARDWARE REMOVAL RIGHT WRIST (Right Wrist)     Patient location during evaluation: PACU Anesthesia Type: General Level of consciousness: awake and alert Pain management: pain level controlled Vital Signs Assessment: post-procedure vital signs reviewed and stable Respiratory status: spontaneous breathing, nonlabored ventilation, respiratory function stable and patient connected to nasal cannula oxygen Cardiovascular status: blood pressure returned to baseline and stable Postop Assessment: no apparent nausea or vomiting Anesthetic complications: no    Last Vitals:  Vitals:   03/10/17 1516 03/10/17 1525  BP: 132/79 98/76  Pulse: 86 91  Resp: 17 18  Temp:  37.1 C  SpO2: 97% 97%    Last Pain:  Vitals:   03/10/17 1525  TempSrc: Oral  PainSc: 3                  Lititia Sen S

## 2017-03-10 NOTE — Anesthesia Preprocedure Evaluation (Signed)
Anesthesia Evaluation  Patient identified by MRN, date of birth, ID band Patient awake    Reviewed: Allergy & Precautions, H&P , NPO status , Patient's Chart, lab work & pertinent test results  Airway Mallampati: II   Neck ROM: full    Dental   Pulmonary Current Smoker,    breath sounds clear to auscultation       Cardiovascular negative cardio ROS   Rhythm:regular Rate:Normal     Neuro/Psych    GI/Hepatic   Endo/Other  obese  Renal/GU      Musculoskeletal   Abdominal   Peds  Hematology   Anesthesia Other Findings   Reproductive/Obstetrics                             Anesthesia Physical Anesthesia Plan  ASA: II  Anesthesia Plan: General   Post-op Pain Management:  Regional for Post-op pain   Induction: Intravenous  PONV Risk Score and Plan: 1 and Ondansetron, Dexamethasone, Midazolam and Treatment may vary due to age or medical condition  Airway Management Planned: LMA  Additional Equipment:   Intra-op Plan:   Post-operative Plan:   Informed Consent: I have reviewed the patients History and Physical, chart, labs and discussed the procedure including the risks, benefits and alternatives for the proposed anesthesia with the patient or authorized representative who has indicated his/her understanding and acceptance.     Plan Discussed with: CRNA, Anesthesiologist and Surgeon  Anesthesia Plan Comments:         Anesthesia Quick Evaluation

## 2017-03-10 NOTE — Op Note (Signed)
NAMEDORIS, MCGILVERY NO.:  [id number]  MEDICAL RECORD NO.:  [id number]  LOCATION:                                 FACILITY:  PHYSICIAN:  Betha Loa, MD             DATE OF BIRTH:  DATE OF PROCEDURE:  03/10/2017 DATE OF DISCHARGE:                              OPERATIVE REPORT   PREOPERATIVE DIAGNOSIS:  Right wrist retained hardware.  POSTOPERATIVE DIAGNOSIS:  Right wrist retained hardware.  PROCEDURE:  Right wrist removal of hardware.  SURGEON:  Betha Loa, MD.  ASSISTANT:  None.  ANESTHESIA:  General.  IV FLUIDS:  Per anesthesia flow sheet.  ESTIMATED BLOOD LOSS:  Minimal.  COMPLICATIONS:  None.  SPECIMENS:  None.  TOURNIQUET TIME:  Approximately 30 minutes.  DISPOSITION:  Stable to PACU.  INDICATIONS:  Mr. Lightner is a 32 year old male, who underwent open reduction internal fixation of right distal radius fracture approximately 5 months ago.  He has had pain in the volar wrist and evidence of prominent hardware on radiographs.  I recommended removal of the plate.  Risks, benefits, and alternatives of the surgery were discussed including the risk of blood loss; infection; damage to nerves, vessels, tendons, ligaments, bone; failure of surgery; need for additional surgery; complications with wound healing; continued pain. He voiced understanding of these risks and elected to proceed.  OPERATIVE COURSE:  After being identified preoperatively by myself, the patient and I agreed upon the procedure and site of procedure.  Surgical site was marked.  Risks, benefits, and alternatives of surgery were reviewed and he wished to proceed.  Surgical consent had been signed. He was given IV Ancef as preoperative antibiotic prophylaxis.  He was transferred to the operating room and placed on the operating room table in supine position with the right upper extremity on arm board.  General anesthesia was induced by anesthesiologist.  Right upper extremity  was prepped and draped in normal sterile orthopedic fashion.  Surgical pause was performed between surgeons, Anesthesia, and operating room staff; and all were in agreement as to the patient, procedure, and site of procedure.  Tourniquet at the proximal aspect of the extremity was inflated to 250 mmHg after exsanguination of the limb with an Esmarch bandage.  Previous incision was followed.  This was carried into subcutaneous tissues by spreading technique.  Bipolar electrocautery was used to obtain hemostasis.  The FCR tendon sheath was incised and the FCR and FPL swept ulnarly to protect the palmar cutaneous branch of the median nerve.  The plate was identified deep to the partly reformed pronator quadratus.  It had eroded through the soft tissues at the ulnar side.  The flexor tendons to the digits were examined.  The fingers were placed through range of motion.  No damage to the tendons was noted. The plate was cleared of soft tissue coverage.  It was then removed with the screwdrivers.  It was removed in its entirety.  The rongeurs were used to take down prominent areas of bone.  The wound was copiously irrigated with sterile saline.  The remaining pronator quadratus was then repaired back over  top of the area with 4-0 Vicryl suture in a figure-of-eight fashion.  Two inverted interrupted sutures were placed in the subcutaneous tissues and skin was closed with 4-0 nylon in a horizontal mattress fashion.  A spot AP film was taken ensuring complete removal of radiopaque hardware, which was the case.  The wound was injected with 0.25% plain Marcaine to aid in postoperative analgesia. The wound was then dressed with sterile Xeroform, 4x4s, and wrapped with a Kerlix bandage.  A volar splint was placed and wrapped with Kerlix and Ace bandage.  Tourniquet was deflated at approximately 30 minutes. Fingertips were pink with brisk capillary refill after deflation of tourniquet.  The operative  drapes were broken down.  The patient was awakened from anesthesia safely.  He was transferred back to the stretcher and taken to PACU in stable condition.  I will see him back in the office in 1 week for postoperative followup.  I will give him Norco 5/325 one to two p.o. q.6 hours p.r.n. pain, dispensed #20..     Betha LoaMarland KitchenKevin Homero Hyson, MD     KK/MEDQ  D:  03/10/2017  T:  03/10/2017  Job:  [id number]

## 2017-03-13 ENCOUNTER — Encounter (HOSPITAL_BASED_OUTPATIENT_CLINIC_OR_DEPARTMENT_OTHER): Payer: Self-pay | Admitting: Orthopedic Surgery

## 2017-03-16 ENCOUNTER — Other Ambulatory Visit: Payer: Self-pay

## 2017-03-16 ENCOUNTER — Encounter: Payer: Self-pay | Admitting: Physician Assistant

## 2017-03-16 ENCOUNTER — Ambulatory Visit (INDEPENDENT_AMBULATORY_CARE_PROVIDER_SITE_OTHER): Payer: 59 | Admitting: Physician Assistant

## 2017-03-16 VITALS — BP 112/72 | HR 91 | Temp 97.9°F | Resp 16 | Ht 70.0 in | Wt 269.8 lb

## 2017-03-16 DIAGNOSIS — Z0000 Encounter for general adult medical examination without abnormal findings: Secondary | ICD-10-CM | POA: Diagnosis not present

## 2017-03-16 DIAGNOSIS — F17200 Nicotine dependence, unspecified, uncomplicated: Secondary | ICD-10-CM | POA: Diagnosis not present

## 2017-03-16 DIAGNOSIS — F988 Other specified behavioral and emotional disorders with onset usually occurring in childhood and adolescence: Secondary | ICD-10-CM

## 2017-03-16 MED ORDER — LISDEXAMFETAMINE DIMESYLATE 70 MG PO CAPS: 70.0000 mg | ORAL_CAPSULE | Freq: Every day | ORAL | 0 refills | Status: DC

## 2017-03-16 NOTE — Progress Notes (Signed)
Patient ID: Eric Hodge MRN: [id number], DOB: Jun 21, 1985 32 y.o. Date of Encounter: 03/16/2017, 11:08 AM    Chief Complaint: Physical (CPE)  HPI: 32 y.o. y/o male here for CPE.   We discussed the fact that he has seen me for multiple visits in the past but that I have not seen him recently. He states that he was busy with the [employer]-- doing training.  Says that it has all been training-- just here in the [place]-- had not been deployed. His right arm is wrapped in bandage.  Says that-- "that is how he broke his right arm"-- was involved "with those trainings".  Says he "had to have plate and screws but then had to have those removed so just had that surgery" recently.  Also discussed that I had seen his son for visits in the past.  He states that his son is now 67 years old and will be starting kindergarten in the fall.  Says that he is in the [employer].  Does that one weekend a month and 2 weeks in the summer.  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  He is not fasting today but reports that he can return fasting tomorrow morning for lab work.  He states that he feels that he needs to get back on the Vyvanse for ADD.  Says that he has been having problems with attention and focus and has realized that he needed to get back on medicine but was waiting for things to be stable regarding the [employer] training etc.  The medication worked very well for him in the past and really helped him focus, pay attention, and complete tasks and it caused no adverse effects.  With his current job-- he has noticed significant difficulty with these--- attention and focus and staying on task.  Reports that he does continue to smoke.  Still is not ready to quit.  Discussed family history and asked if there were any updates regarding that.   He reports that his father did have MI this past August and was treated with a stent.  States that this was his  first diagnosed CAD and he was age 32.  Has no other updates regarding his personal medical history or any family medical history.   No other concerns to address today.    Review of Systems: Consitutional: No fever, chills, fatigue, night sweats, lymphadenopathy, or weight changes. Eyes: No visual changes, eye redness, or discharge. ENT/Mouth: Ears: No otalgia, tinnitus, hearing loss, discharge. Nose: No congestion, rhinorrhea, sinus pain, or epistaxis. Throat: No sore throat, post nasal drip, or teeth pain. Cardiovascular: No CP, palpitations, diaphoresis, DOE, edema, orthopnea, PND. Respiratory: No cough, hemoptysis, SOB, or wheezing. Gastrointestinal: No anorexia, dysphagia, reflux, pain, nausea, vomiting, hematemesis, diarrhea, constipation, BRBPR, or melena. Genitourinary: No dysuria, frequency, urgency, hematuria, incontinence, nocturia, decreased urinary stream, discharge, impotence, or testicular pain/masses. Musculoskeletal: No decreased ROM, myalgias, stiffness, joint swelling, or weakness. Skin: No rash, erythema, lesion changes, pain, warmth, jaundice, or pruritis. Neurological: No headache, dizziness, syncope, seizures, tremors, memory loss, coordination problems, or paresthesias. Psychological: No anxiety, depression, hallucinations, SI/HI. Endocrine: No fatigue, polydipsia, polyphagia, polyuria, or known diabetes. All other systems were reviewed and are otherwise negative.  Past Medical History:  Diagnosis Date  . Retained orthopedic hardware 02/2017   right distal radius  . Runny nose 03/06/2017   clear drainage, per pt.     Past Surgical History:  Procedure Laterality Date  .  CARPAL TUNNEL RELEASE Right 10/06/2016   Procedure: CARPAL TUNNEL RELEASE;  Surgeon: Betha LoaKuzma, Kevin, MD;  Location: [facility];  Service: Orthopedics;  Laterality: Right;  . FOREIGN BODY REMOVAL  09/08/2011   Procedure: REMOVAL FOREIGN BODY EXTREMITY;  Surgeon: Tami RibasKevin R Kuzma, MD;   Location: [facility];  Service: Orthopedics;  Laterality: Left;  FOREIGN BODY REMOVAL LEFT LONG FINGER  . HARDWARE REMOVAL Right 03/10/2017   Procedure: HARDWARE REMOVAL RIGHT WRIST;  Surgeon: Betha LoaKuzma, Kevin, MD;  Location: [facility];  Service: Orthopedics;  Laterality: Right;  . MASS EXCISION Left 10/29/2015   Procedure: LEFT WRIST EXCISION MASS;  Surgeon: Betha LoaKevin Kuzma, MD;  Location: [facility];  Service: Orthopedics;  Laterality: Left;  LEFT WRIST EXCISION MASS  . OPEN REDUCTION INTERNAL FIXATION (ORIF) DISTAL RADIAL FRACTURE Right 10/06/2016   Procedure: OPEN REDUCTION INTERNAL FIXATION (ORIF) RIGHT DISTAL RADIUS;  Surgeon: Betha LoaKuzma, Kevin, MD;  Location: [facility];  Service: Orthopedics;  Laterality: Right;  . WISDOM TOOTH EXTRACTION  2012    Home Meds:  Outpatient Medications Prior to Visit  Medication Sig Dispense Refill  . diclofenac (VOLTAREN) 75 MG EC tablet Take 75 mg by mouth as needed. Knee pain    . HYDROcodone-acetaminophen (NORCO) 10-325 MG tablet Take 1 tablet by mouth every 6 (six) hours as needed.    Marland Kitchen. HYDROcodone-acetaminophen (NORCO) 5-325 MG tablet 1-2 tabs po q6 hours prn pain 20 tablet 0   No facility-administered medications prior to visit.     Allergies: No Known Allergies  Social History   Socioeconomic History  . Marital status: Divorced    Spouse name: Not on file  . Number of children: Not on file  . Years of education: Not on file  . Highest education level: Not on file  Social Needs  . Financial resource strain: Not on file  . Food insecurity - worry: Not on file  . Food insecurity - inability: Not on file  . Transportation needs - medical: Not on file  . Transportation needs - non-medical: Not on file  Occupational History  . Not on file  Tobacco Use  . Smoking status: Current Every Day Smoker    Packs/day: 0.50    Years: 10.00    Pack years: 5.00    Types: Cigarettes  . Smokeless  tobacco: Never Used  Substance and Sexual Activity  . Alcohol use: Yes    Comment: occasionally  . Drug use: No  . Sexual activity: Not Currently  Other Topics Concern  . 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   No routine exercise.   In [employer]--Has PT tests--will exercise prior to these      He has a 2 y/o son---currently pt has him Tuesday-Friday but says 'that may change"   Rest of time, child is with his mom--in [place]    Family History  Problem Relation Age of Onset  . Stroke Paternal Grandmother   . Hypertension Paternal Grandmother   . Cancer Paternal Grandfather   . Hyperlipidemia Paternal Grandfather   . Coronary artery disease Father [id number]       At [age] had MI --> stent. This was his 1st dx CAD    Physical Exam: Blood pressure 112/72, pulse 91, temperature 97.9 F (36.6 C), temperature source Oral, resp. rate 16, height 5\' 10"  (1.778 m), weight 122.4 kg (269 lb 12.8 oz), SpO2 98 %.  General: Well developed, well nourished WM. Appears in no acute distress. HEENT: Normocephalic, atraumatic. Conjunctiva pink, sclera non-icteric. Pupils 2 mm constricting to 1 mm, round, regular, and equally reactive to light and accomodation. EOMI. Internal auditory canal clear. TMs with good cone of light and without pathology. Nasal mucosa pink. Nares are without discharge. No sinus tenderness. Oral mucosa pink.  Neck: Supple. Trachea midline. No thyromegaly. Full ROM. No lymphadenopathy. Lungs: Clear to auscultation bilaterally without wheezes, rales, or rhonchi. Breathing is of normal effort and unlabored. Cardiovascular: RRR with S1 S2. No murmurs, rubs, or gallops. Distal pulses 2+ symmetrically. No carotid or abdominal bruits. Abdomen: Soft, non-tender, non-distended with normoactive bowel sounds. No hepatosplenomegaly or masses. No rebound/guarding. No CVA tenderness. No hernias. Musculoskeletal: Full range of motion and 5/5  strength throughout.  Right forearm is bandaged. Skin: Warm and moist without erythema, ecchymosis, wounds, or rash. Neuro: A+Ox3. CN II-XII grossly intact. Moves all extremities spontaneously. Full sensation throughout. Normal gait.  Psych:  Responds to questions appropriately with a normal affect.   Assessment/Plan:  32 y.o. y/o  male here for CPE  -1. Encounter for preventive health examination A. Screening Labs: He is not fasting today but will return fasting for labs tomorrow morning. - CBC with Differential/Platelet; Future - COMPLETE METABOLIC PANEL WITH GFR; Future - Lipid panel; Future   B. Screening For Prostate Cancer: N/A until age 89  C. Screening For Colorectal Cancer:  N/A until age 65  D. Immunizations: Flu----------------- reports that he did get flu vaccine for this flu season. Tetanus------------- this is up-to-date and documented in epic. Pneumococcal----need to check to see if he had this with the [place].  Given that he is a smoker should get Pneumovax 23. Shingrix--------------N/A until age 80     2. Attention deficit disorder (ADD) without hyperactivity The past he used Vyvanse 70 mg and this worked very well at controlling symptoms and cause no adverse effects so we will restart this medication.  Will have him return for follow-up visit in the next 1-3 months. - lisdexamfetamine (VYVANSE) 70 MG capsule; Take 1 capsule (70 mg total) by mouth daily.  Dispense: 30 capsule; Refill: 0  3. Smoker He Is aware of need for cessation but is not interested at this time.    Signed:   [address] [place], [place]  03/16/2017 11:08 AM

## 2017-03-17 ENCOUNTER — Other Ambulatory Visit: Payer: 59

## 2017-03-17 DIAGNOSIS — Z0000 Encounter for general adult medical examination without abnormal findings: Secondary | ICD-10-CM | POA: Diagnosis not present

## 2017-03-17 LAB — COMPLETE METABOLIC PANEL WITH GFR
AG Ratio: 1.9 (calc) (ref 1.0–2.5)
ALBUMIN MSPROF: 4.5 g/dL (ref 3.6–5.1)
ALKALINE PHOSPHATASE (APISO): 94 U/L (ref 40–115)
ALT: 54 U/L — ABNORMAL HIGH (ref 9–46)
AST: 30 U/L (ref 10–40)
BUN: 16 mg/dL (ref 7–25)
CALCIUM: 9.6 mg/dL (ref 8.6–10.3)
CO2: 22 mmol/L (ref 20–32)
CREATININE: 1.07 mg/dL (ref 0.60–1.35)
Chloride: 106 mmol/L (ref 98–110)
GFR, EST NON AFRICAN AMERICAN: 93 mL/min/{1.73_m2} (ref >=60)
GFR, Est African American: 108 mL/min/{1.73_m2} (ref >=60)
GLOBULIN: 2.4 g/dL (ref 1.9–3.7)
GLUCOSE: 102 mg/dL — AB (ref 65–99)
Potassium: 4.3 mmol/L (ref 3.5–5.3)
SODIUM: 136 mmol/L (ref 135–146)
Total Bilirubin: 0.9 mg/dL (ref 0.2–1.2)
Total Protein: 6.9 g/dL (ref 6.1–8.1)

## 2017-03-17 LAB — CBC WITH DIFFERENTIAL/PLATELET
Basophils Absolute: 63 cells/uL (ref 0–200)
Basophils Relative: 0.9 %
EOS PCT: 3.7 %
Eosinophils Absolute: 259 cells/uL (ref 15–500)
HEMATOCRIT: 45.2 % (ref 38.5–50.0)
HEMOGLOBIN: 16.1 g/dL (ref 13.2–17.1)
LYMPHS ABS: 1638 {cells}/uL (ref 850–3900)
MCH: 31.8 pg (ref 27.0–33.0)
MCHC: 35.6 g/dL (ref 32.0–36.0)
MCV: 89.2 fL (ref 80.0–100.0)
MONOS PCT: 12.9 %
MPV: 9.8 fL (ref 7.5–12.5)
NEUTROS ABS: 4137 {cells}/uL (ref 1500–7800)
Neutrophils Relative %: 59.1 %
Platelets: 226 10*3/uL (ref 140–400)
RBC: 5.07 10*6/uL (ref 4.20–5.80)
RDW: 12.8 % (ref 11.0–15.0)
Total Lymphocyte: 23.4 %
WBC mixed population: 903 cells/uL (ref 200–950)
WBC: 7 10*3/uL (ref 3.8–10.8)

## 2017-03-17 LAB — LIPID PANEL
CHOL/HDL RATIO: 6.3 (calc) — AB (ref <5.0)
CHOLESTEROL: 264 mg/dL — AB (ref <200)
HDL: 42 mg/dL (ref >40)
LDL CHOLESTEROL (CALC): 184 mg/dL — AB
NON-HDL CHOLESTEROL (CALC): 222 mg/dL — AB (ref <130)
Triglycerides: 202 mg/dL — ABNORMAL HIGH (ref <150)

## 2017-03-20 ENCOUNTER — Other Ambulatory Visit: Payer: Self-pay

## 2017-03-20 DIAGNOSIS — E785 Hyperlipidemia, unspecified: Secondary | ICD-10-CM

## 2017-04-20 ENCOUNTER — Encounter: Payer: Self-pay | Admitting: Physician Assistant

## 2017-04-20 ENCOUNTER — Ambulatory Visit: Payer: 59 | Admitting: Physician Assistant

## 2017-04-20 VITALS — BP 118/76 | HR 96 | Temp 98.0°F | Resp 16 | Ht 70.0 in | Wt 264.0 lb

## 2017-04-20 DIAGNOSIS — Z8249 Family history of ischemic heart disease and other diseases of the circulatory system: Secondary | ICD-10-CM

## 2017-04-20 DIAGNOSIS — Z83438 Family history of other disorder of lipoprotein metabolism and other lipidemia: Secondary | ICD-10-CM | POA: Insufficient documentation

## 2017-04-20 DIAGNOSIS — F988 Other specified behavioral and emotional disorders with onset usually occurring in childhood and adolescence: Secondary | ICD-10-CM

## 2017-04-20 MED ORDER — METHYLPHENIDATE HCL ER (OSM) 54 MG PO TBCR: 54.0000 mg | EXTENDED_RELEASE_TABLET | Freq: Every day | ORAL | 0 refills | Status: DC

## 2017-04-20 NOTE — Progress Notes (Signed)
Patient ID: Eric Hodge MRN: [id number], DOB: 25-May-1985, 32 y.o. Date of Encounter: 04/20/2017, 4:09 PM    Chief Complaint:  Chief Complaint  Patient presents with  . discuss vyvanse     HPI: 32 y.o. year old male here for f/u regarding ADD medication.    THE FOLLOWING IS COPIED FROM HIS RECENT OV/CPE ON 03/16/2017:  HPI: 32 y.o. y/o male here for CPE.   We discussed the fact that he has seen me for multiple visits in the past but that I have not seen him recently. He states that he was busy with the [employer]-- doing training.  Says that it has all been training-- just here in the [place]-- had not been deployed. His right arm is wrapped in bandage.  Says that-- "that is how he broke his right arm"-- was involved "with those trainings".  Says he "had to have plate and screws but then had to have those removed so just had that surgery" recently.  Also discussed that I had seen his son for visits in the past.  He states that his son is now 66 years old and will be starting kindergarten in the fall.  Says that he is in the [employer].  Does that one weekend a month and 2 weeks in the summer.  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  He is not fasting today but reports that he can return fasting tomorrow morning for lab work.  He states that he feels that he needs to get back on the Vyvanse for ADD.  Says that he has been having problems with attention and focus and has realized that he needed to get back on medicine but was waiting for things to be stable regarding the [employer] training etc.  The medication worked very well for him in the past and really helped him focus, pay attention, and complete tasks and it caused no adverse effects.  With his current job-- he has noticed significant difficulty with these--- attention and focus and staying on task.  Reports that he does continue to smoke.  Still is not ready to  quit.  Discussed family history and asked if there were any updates regarding that.   He reports that his father did have MI this past August and was treated with a stent.  States that this was his first diagnosed CAD and he was age 50.  Has no other updates regarding his personal medical history or any family medical history.   No other concerns to address today.   AT THAT OV: --Updated Preventive Care. FLP came back showing LDL 184.  My Result Note at that time: Notes recorded by Karlton Lemon, CMA on 03/20/2017 at 10:53 AM EST Call placed to patient he is aware of his lab results and is scheduled to come back in a month and at that time will make 6 month lab follow up ------  Notes recorded by Dorena Bodo, PA-C on 03/19/2017 at 8:45 AM EST Cholesterol is quite high.  He also smokes and his father recently dx with CAD.  However, he is only 32 y/o and don't want to put him on cholesterol meds unless absolutely necessary.  Tell him to decrease saturated fats in diet and then recheck FLP/LFT in 6 months. Stop/decrease fried foods, whole milk products, other saturated fats. Other labs normal.  2. Attention deficit disorder (ADD) without hyperactivity The past he used Vyvanse 70 mg  and this worked very well at controlling symptoms and cause no adverse effects so we will restart this medication.  Will have him return for follow-up visit in the next 1-3 months. - lisdexamfetamine (VYVANSE) 70 MG capsule; Take 1 capsule (70 mg total) by mouth daily.  Dispense: 30 capsule; Refill: 0  3. Smoker He Is aware of need for cessation but is not interested at this time.      ------------04/20/2017: ------------------------------------------------------------------------------------------------------------------------------------------------------------------------  Today he reports that he has noticed some improvement since being on the [facility] but definitely has not noticed as strong of  an effect as he remembers having when he was on the medication in the past. In the past, remembers the med made him feel extremely focused. Now, he feels some increased focus, but not nearly as much as in past. Feels more easily distracted etc.  I asked if he is consuming caffeine, etc that may be affecting these--says no.   He also discusses recent lipids.  Says he talked to his sister and found out that she has really high cholesterol too. And that for one year she did a lot of exercise and followed very strict diet and recheck cholesterol was still high -- is asking if high cholesterol cna be genetic.   Says he has lost 6 pounds since starting the Vyvanse.  It decreases his appetite so he has decreased his portions.  Also has changed to drinking only water--"cut out the soda" I asked if his diet was bad before---he says " oh yeah..--.Marland Kitchen Was eating pizza, burhgers, fries--..--.Marland Kitchen And was eating a lot-- large portions--..-- would sit on couch and just keep eating." Has made changes.      Home Meds:   Outpatient Medications Prior to Visit  Medication Sig Dispense Refill  . diclofenac (VOLTAREN) 75 MG EC tablet Take 75 mg by mouth as needed. Knee pain    . HYDROcodone-acetaminophen (NORCO) 10-325 MG tablet Take 1 tablet by mouth every 6 (six) hours as needed.    Marland Kitchen lisdexamfetamine (VYVANSE) 70 MG capsule Take 1 capsule (70 mg total) by mouth daily. 30 capsule 0  . HYDROcodone-acetaminophen (NORCO) 5-325 MG tablet 1-2 tabs po q6 hours prn pain 20 tablet 0   No facility-administered medications prior to visit.     Allergies: No Known Allergies    Review of Systems: See HPI for pertinent ROS. All other ROS negative.    Physical Exam: Blood pressure 118/76, pulse 96, temperature 98 F (36.7 C), temperature source Oral, resp. rate 16, height 5\' 10"  (1.778 m), weight 119.7 kg (264 lb), SpO2 97 %., Body mass index is 37.88 kg/m. General:  WM. Appears in no acute distress. Neck: Supple. No  thyromegaly. No lymphadenopathy. Lungs: Clear bilaterally to auscultation without wheezes, rales, or rhonchi. Breathing is unlabored. Heart: Regular rhythm. No murmurs, rubs, or gallops. Msk:  Strength and tone normal for age. Extremities/Skin: Warm and dry.  Neuro: Alert and oriented X 3. Moves all extremities spontaneously. Gait is normal. CNII-XII grossly in tact. Psych:  Responds to questions appropriately with a normal affect.     ASSESSMENT AND PLAN:  32 y.o. year old male with  1. Attention deficit disorder, unspecified hyperactivity presence Will change to Concerta and see if this more effective for him.  F/U OV 1 month, f/u sooner if needed. - methylphenidate 54 MG PO CR tablet; Take 1 tablet (54 mg total) by mouth daily.  Dispense: 30 tablet; Refill: 0  2. Family history of cardiovascular disease  3. Family history of hyperlipidemia    Signed, Eric Hodge, [place], [facility] 04/20/2017 4:09 PM

## 2017-04-21 ENCOUNTER — Telehealth: Payer: Self-pay

## 2017-04-21 NOTE — Telephone Encounter (Signed)
Prior Authorization started for methylphenidate [facility] ER 54 mg tablet  ID PA- [id number]  YN#[id number]

## 2017-04-25 NOTE — Telephone Encounter (Signed)
Prior authorization for Methylphenidate 54 mg was denied. Concerta 54 mg  was an alternative pharmacy was notified to change rx to Concerta 54 mg CR tablet  to run it through

## 2017-05-22 ENCOUNTER — Ambulatory Visit: Payer: 59 | Admitting: Physician Assistant

## 2017-05-22 ENCOUNTER — Encounter: Payer: Self-pay | Admitting: Physician Assistant

## 2017-05-22 ENCOUNTER — Other Ambulatory Visit: Payer: Self-pay

## 2017-05-22 VITALS — BP 112/70 | HR 90 | Temp 97.9°F | Ht 71.0 in | Wt 254.0 lb

## 2017-05-22 DIAGNOSIS — Z83438 Family history of other disorder of lipoprotein metabolism and other lipidemia: Secondary | ICD-10-CM | POA: Diagnosis not present

## 2017-05-22 DIAGNOSIS — Z8249 Family history of ischemic heart disease and other diseases of the circulatory system: Secondary | ICD-10-CM

## 2017-05-22 DIAGNOSIS — F988 Other specified behavioral and emotional disorders with onset usually occurring in childhood and adolescence: Secondary | ICD-10-CM | POA: Diagnosis not present

## 2017-05-22 DIAGNOSIS — E785 Hyperlipidemia, unspecified: Secondary | ICD-10-CM

## 2017-05-22 MED ORDER — METHYLPHENIDATE HCL ER (OSM) 54 MG PO TBCR: 54.0000 mg | EXTENDED_RELEASE_TABLET | Freq: Every day | ORAL | 0 refills | Status: DC

## 2017-05-22 NOTE — Progress Notes (Signed)
Patient ID: Eric Hodge MRN: [id number], DOB: 03/11/1985, 32 y.o. Date of Encounter: 05/22/2017, 3:55 PM    Chief Complaint:  Chief Complaint  Patient presents with  . ADHD    Patient in today for a 1 month follow up on ADD. Patient states that Concerta is working a lot better.     HPI: 32 y.o. year old male here for f/u regarding ADD medication.    THE FOLLOWING IS COPIED FROM HIS RECENT OV/CPE ON 03/16/2017:  HPI: 32 y.o. y/o male here for CPE.   We discussed the fact that he has seen me for multiple visits in the past but that I have not seen him recently. He states that he was busy with the [employer]-- doing training.  Says that it has all been training-- just here in the [place]-- had not been deployed. His right arm is wrapped in bandage.  Says that-- "that is how he broke his right arm"-- was involved "with those trainings".  Says he "had to have plate and screws but then had to have those removed so just had that surgery" recently.  Also discussed that I had seen his son for visits in the past.  He states that his son is now 81 years old and will be starting kindergarten in the fall.  Says that he is in the [employer].  Does that one weekend a month and 2 weeks in the summer.  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  He is not fasting today but reports that he can return fasting tomorrow morning for lab work.  He states that he feels that he needs to get back on the Vyvanse for ADD.  Says that he has been having problems with attention and focus and has realized that he needed to get back on medicine but was waiting for things to be stable regarding the [employer] training etc.  The medication worked very well for him in the past and really helped him focus, pay attention, and complete tasks and it caused no adverse effects.  With his current job-- he has noticed significant difficulty with these--- attention and focus and  staying on task.  Reports that he does continue to smoke.  Still is not ready to quit.  Discussed family history and asked if there were any updates regarding that.   He reports that his father did have MI this past August and was treated with a stent.  States that this was his first diagnosed CAD and he was age 32.  Has no other updates regarding his personal medical history or any family medical history.   No other concerns to address today.   AT THAT OV: --Updated Preventive Care. FLP came back showing LDL 184.  My Result Note at that time: Notes recorded by Karlton Lemon, CMA on 03/20/2017 at 10:53 AM EST Call placed to patient he is aware of his lab results and is scheduled to come back in a month and at that time will make 6 month lab follow up ------  Notes recorded by Dorena Bodo, PA-C on 03/19/2017 at 8:45 AM EST Cholesterol is quite high.  He also smokes and his father recently dx with CAD.  However, he is only 32 y/o and don't want to put him on cholesterol meds unless absolutely necessary.  Tell him to decrease saturated fats in diet and then recheck FLP/LFT in 6 months. Stop/decrease fried foods, whole milk  products, other saturated fats. Other labs normal.  2. Attention deficit disorder (ADD) without hyperactivity The past he used Vyvanse 70 mg and this worked very well at controlling symptoms and cause no adverse effects so we will restart this medication.  Will have him return for follow-up visit in the next 1-3 months. - lisdexamfetamine (VYVANSE) 70 MG capsule; Take 1 capsule (70 mg total) by mouth daily.  Dispense: 30 capsule; Refill: 0  3. Smoker He Is aware of need for cessation but is not interested at this time.      ------------04/20/2017: ------------------------------------------------------------------------------------------------------------------------------------------------------------------------  Today he reports that he has noticed some  improvement since being on the [facility] but definitely has not noticed as strong of an effect as he remembers having when he was on the medication in the past. In the past, remembers the med made him feel extremely focused. Now, he feels some increased focus, but not nearly as much as in past. Feels more easily distracted etc.  I asked if he is consuming caffeine, etc that may be affecting these--says no.   He also discusses recent lipids.  Says he talked to his sister and found out that she has really high cholesterol too. And that for one year she did a lot of exercise and followed very strict diet and recheck cholesterol was still high -- is asking if high cholesterol cna be genetic.   Says he has lost 6 pounds since starting the Vyvanse.  It decreases his appetite so he has decreased his portions.  Also has changed to drinking only water--"cut out the soda" I asked if his diet was bad before---he says " oh yeah..--.Marland Kitchen Was eating pizza, burhgers, fries--..--.Marland Kitchen And was eating a lot-- large portions--..-- would sit on couch and just keep eating." Has made changes.    05/22/2017:  At last visit changed from Vyvanse to Concerta 54 mg daily. Today he reports that he is noticing about the same effect from the Concerta as he was from the Vyvanse.  Says that this is fine and will continue on current medication and monitor. States that the Concerta is causing no adverse effects.  Causing no headache.  No abdominal pain.  No insomnia. Today I noted that he has lost 10 pounds since last visit.  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  Improving his diet and exercise to try to improve his cholesterol as well as control his weight.    Home Meds:   Outpatient Medications Prior to Visit  Medication Sig Dispense Refill  . diclofenac (VOLTAREN) 75 MG EC tablet Take 75 mg by mouth as needed. Knee pain    .  HYDROcodone-acetaminophen (NORCO) 10-325 MG tablet Take 1 tablet by mouth every 6 (six) hours as needed.    . methylphenidate 54 MG PO CR tablet Take 1 tablet (54 mg total) by mouth daily. 30 tablet 0  . lisdexamfetamine (VYVANSE) 70 MG capsule Take 1 capsule (70 mg total) by mouth daily. (Patient not taking: Reported on 05/22/2017) 30 capsule 0   No facility-administered medications prior to visit.     Allergies: No Known Allergies    Review of Systems: See HPI for pertinent ROS. All other ROS negative.    Physical Exam: Blood pressure 112/70, pulse 90, temperature 97.9 F (36.6 C), temperature source Oral, height  (1.803 m), weight 115.2 kg (254 lb), SpO2 97 %., Body mass index is 35.43  kg/m. General: WM. Appears in no acute distress. Neck: Supple. No thyromegaly. No lymphadenopathy. Lungs: Clear bilaterally to auscultation without wheezes, rales, or rhonchi. Breathing is unlabored. Heart: RRR with S1 S2. No murmurs, rubs, or gallops. Abdomen: Soft, non-tender, non-distended with normoactive bowel sounds. No hepatomegaly. No rebound/guarding. No obvious abdominal masses. Musculoskeletal:  Strength and tone normal for age. Extremities/Skin: Warm and dry.  Neuro: Alert and oriented X 3. Moves all extremities spontaneously. Gait is normal. CNII-XII grossly in tact. Psych:  Responds to questions appropriately with a normal affect.     ASSESSMENT AND PLAN:  32 y.o. year old male with  1. Attention deficit disorder, unspecified hyperactivity presence 05/22/2017--- ADD is controlled.  Continue Concerta 54 mg daily.  Plan for follow-up visit in 3 months.  Follow-up sooner if needed.   If things are stable at the 7-month follow-up visit, then will stretch out to six-month follow-up.  Hyperlipidemia Family history of cardiovascular disease Family history of hyperlipidemia 05/22/2017: See HPI from today and prior visit for detail.  He has made significant diet changes.  Will wait to  recheck lipid panel in the future after he is continued diet changes.   [address] [place], [place], [facility] 05/22/2017 3:55 PM

## 2017-07-06 ENCOUNTER — Other Ambulatory Visit: Payer: Self-pay | Admitting: Physician Assistant

## 2017-07-06 DIAGNOSIS — F988 Other specified behavioral and emotional disorders with onset usually occurring in childhood and adolescence: Secondary | ICD-10-CM

## 2017-07-06 MED ORDER — METHYLPHENIDATE HCL ER (OSM) 54 MG PO TBCR: 54.0000 mg | EXTENDED_RELEASE_TABLET | Freq: Every day | ORAL | 0 refills | Status: DC

## 2017-07-06 NOTE — Telephone Encounter (Signed)
Last OV 05/22/2017 Last refill 05/22/2017 Ok to refill?

## 2017-07-06 NOTE — Telephone Encounter (Signed)
Patient needs refill on his concerta to be sent to [facility]

## 2017-08-08 ENCOUNTER — Other Ambulatory Visit: Payer: Self-pay | Admitting: Physician Assistant

## 2017-08-08 DIAGNOSIS — F988 Other specified behavioral and emotional disorders with onset usually occurring in childhood and adolescence: Secondary | ICD-10-CM

## 2017-08-08 NOTE — Telephone Encounter (Signed)
[facility]  Patient needs refill on his concerta

## 2017-08-09 MED ORDER — METHYLPHENIDATE HCL ER (OSM) 54 MG PO TBCR: 54.0000 mg | EXTENDED_RELEASE_TABLET | Freq: Every day | ORAL | 0 refills | Status: DC

## 2017-08-09 NOTE — Telephone Encounter (Signed)
Last OV 05/22/2017 Last refill 07/06/2017 Ok to refill?

## 2017-08-09 NOTE — Telephone Encounter (Signed)
Remind patient that last office visit was 05/22/2017.   At that time, planned for follow-up 3 months.   Will be due for visit prior to next Rx. Will  refill x1 now.

## 2017-08-23 ENCOUNTER — Ambulatory Visit: Payer: 59 | Admitting: Physician Assistant

## 2017-08-24 ENCOUNTER — Other Ambulatory Visit: Payer: Self-pay

## 2017-08-24 ENCOUNTER — Encounter: Payer: Self-pay | Admitting: Physician Assistant

## 2017-08-24 ENCOUNTER — Ambulatory Visit (INDEPENDENT_AMBULATORY_CARE_PROVIDER_SITE_OTHER): Payer: 59 | Admitting: Physician Assistant

## 2017-08-24 VITALS — BP 122/74 | HR 102 | Temp 98.3°F | Resp 20 | Ht 69.0 in | Wt 255.4 lb

## 2017-08-24 DIAGNOSIS — L219 Seborrheic dermatitis, unspecified: Secondary | ICD-10-CM | POA: Diagnosis not present

## 2017-08-24 DIAGNOSIS — F909 Attention-deficit hyperactivity disorder, unspecified type: Secondary | ICD-10-CM

## 2017-08-24 MED ORDER — TRIAMCINOLONE ACETONIDE 0.5 % EX OINT: 1.0000 "application " | TOPICAL_OINTMENT | Freq: Two times a day (BID) | CUTANEOUS | 0 refills | Status: DC

## 2017-08-24 MED ORDER — KETOCONAZOLE 2 % EX CREA: 1.0000 "application " | TOPICAL_CREAM | Freq: Every day | CUTANEOUS | 0 refills | Status: DC

## 2017-08-24 NOTE — Progress Notes (Signed)
Patient ID: Eric Hodge MRN: [id number], DOB: March 03, 1985, 32 y.o. Date of Encounter: 08/24/2017, 4:42 PM    Chief Complaint:  Chief Complaint  Patient presents with  . follow up with medication  . red spots on hairline     HPI: 32 y.o. year old male here for f/u regarding ADD medication.    THE FOLLOWING IS COPIED FROM HIS RECENT OV/CPE ON 03/16/2017:  HPI: 32 y.o. y/o male here for CPE.   We discussed the fact that he has seen me for multiple visits in the past but that I have not seen him recently. He states that he was busy with the [employer]-- doing training.  Says that it has all been training-- just here in the [place]-- had not been deployed. His right arm is wrapped in bandage.  Says that-- "that is how he broke his right arm"-- was involved "with those trainings".  Says he "had to have plate and screws but then had to have those removed so just had that surgery" recently.  Also discussed that I had seen his son for visits in the past.  He states that his son is now 45 years old and will be starting kindergarten in the fall.  Says that he is in the [employer].  Does that one weekend a month and 2 weeks in the summer.  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  He is not fasting today but reports that he can return fasting tomorrow morning for lab work.  He states that he feels that he needs to get back on the Vyvanse for ADD.  Says that he has been having problems with attention and focus and has realized that he needed to get back on medicine but was waiting for things to be stable regarding the [employer] training etc.  The medication worked very well for him in the past and really helped him focus, pay attention, and complete tasks and it caused no adverse effects.  With his current job-- he has noticed significant difficulty with these--- attention and focus and staying on task.  Reports that he does continue to  smoke.  Still is not ready to quit.  Discussed family history and asked if there were any updates regarding that.   He reports that his father did have MI this past August and was treated with a stent.  States that this was his first diagnosed CAD and he was age 23.  Has no other updates regarding his personal medical history or any family medical history.   No other concerns to address today.   AT THAT OV: --Updated Preventive Care. FLP came back showing LDL 184.  My Result Note at that time: Notes recorded by Karlton Lemon, CMA on 03/20/2017 at 10:53 AM EST Call placed to patient he is aware of his lab results and is scheduled to come back in a month and at that time will make 6 month lab follow up ------  Notes recorded by Dorena Bodo, PA-C on 03/19/2017 at 8:45 AM EST Cholesterol is quite high.  He also smokes and his father recently dx with CAD.  However, he is only 32 y/o and don't want to put him on cholesterol meds unless absolutely necessary.  Tell him to decrease saturated fats in diet and then recheck FLP/LFT in 6 months. Stop/decrease fried foods, whole milk products, other saturated fats. Other labs normal.  2. Attention deficit disorder (ADD) without  hyperactivity The past he used Vyvanse 70 mg and this worked very well at controlling symptoms and cause no adverse effects so we will restart this medication.  Will have him return for follow-up visit in the next 1-3 months. - lisdexamfetamine (VYVANSE) 70 MG capsule; Take 1 capsule (70 mg total) by mouth daily.  Dispense: 30 capsule; Refill: 0  3. Smoker He Is aware of need for cessation but is not interested at this time.      ------------04/20/2017: ------------------------------------------------------------------------------------------------------------------------------------------------------------------------  Today he reports that he has noticed some improvement [facility] but definitely  has not noticed as strong of an effect as he remembers having when he was on the medication in the past. In the past, remembers the med made him feel extremely focused. Now, he feels some increased focus, but not nearly as much as in past. Feels more easily distracted etc.  I asked if he is consuming caffeine, etc that may be affecting these--says no.   He also discusses recent lipids.  Says he talked to his sister and found out that she has really high cholesterol too. And that for one year she did a lot of exercise and followed very strict diet and recheck cholesterol was still high -- is asking if high cholesterol cna be genetic.   Says he has lost 6 pounds since starting the Vyvanse.  It decreases his appetite so he has decreased his portions.  Also has changed to drinking only water--"cut out the soda" I asked if his diet was bad before---he says " oh yeah..--.Marland Kitchen. Was eating pizza, burhgers, fries--..--.Marland Kitchen. And was eating a lot-- large portions--..-- would sit on couch and just keep eating." Has made changes.    05/22/2017:  At last visit changed from Vyvanse to Concerta 54 mg daily. Today he reports that he is noticing about the same effect from the Concerta as he was from the Vyvanse.  Says that this is fine and will continue on current medication and monitor. States that the Concerta is causing no adverse effects.  Causing no headache.  No abdominal pain.  No insomnia. Today I noted that he has lost 10 pounds since last visit.  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  Improving his diet and exercise to try to improve his cholesterol as well as control his weight.   08/24/2017: At last visit continue the Concerta 54 mg daily.  He reports that this is continue to work fine and is controlling his attention and focus and is causing no adverse effects. He reports that he has an area of rash right near his  hairline on the right front corner.  He states that he had similar rash about 8 years ago and that that Dr. Marlana SalvageWitt thought it was psoriasis.  States that he has had no rash since then but this spot has been present for a few weeks.  No other concerns to address today.     Home Meds:   Outpatient Medications Prior to Visit  Medication Sig Dispense Refill  . diclofenac (VOLTAREN) 75 MG EC tablet Take 75 mg by mouth as needed. Knee pain    . HYDROcodone-acetaminophen (NORCO) 10-325 MG tablet Take 1 tablet by mouth every 6 (six) hours as needed.    . methylphenidate 54 MG PO CR tablet Take 1 tablet (54 mg total) by mouth daily. 30 tablet 0   No facility-administered  medications prior to visit.     Allergies: No Known Allergies    Review of Systems: See HPI for pertinent ROS. All other ROS negative.    Physical Exam: Blood pressure 122/74, pulse (!) 102, temperature 98.3 F (36.8 C), temperature source Oral, resp. rate 20, height 5\' 9"  (1.753 m), weight 115.8 kg, SpO2 98 %., Body mass index is 37.72 kg/m. General: Appears in no acute distress. Neck: Supple. No thyromegaly. No lymphadenopathy. Lungs: Clear bilaterally to auscultation without wheezes, rales, or rhonchi. Breathing is unlabored. Heart: RRR with S1 S2. No murmurs, rubs, or gallops. Musculoskeletal:  Strength and tone normal for age. Skin:  At hairline--at corner--right---there is 1cm round area of erythema. This skin is rough. There is one scale in center of this.  There is no plaque build-up.  There is no central clearing---does not look like tinea. Neuro: Alert and oriented X 3. Moves all extremities spontaneously. Gait is normal. CNII-XII grossly in tact. Psych:  Responds to questions appropriately with a normal affect.     ASSESSMENT AND PLAN:  32 y.o. year old male with   1. Attention deficit disorder, unspecified hyperactivity presence 8/82019--- ADD is controlled.  Continue Concerta 54 mg daily.  Plan for  follow-up visit in 6 months.  Follow-up sooner if needed.     2. Seborrheic dermatitis of scalp Area seems consistent with possible seborrheic dermatitis of scalp or possible psoriasis.  Will treat to cover both.  He can alternate applying the ketoconazole cream and the triamcinolone ointment.  Follow-up if needed. - ketoconazole (NIZORAL) 2 % cream; Apply 1 application topically daily.  Dispense: 15 g; Refill: 0 - triamcinolone ointment (KENALOG) 0.5 %; Apply 1 application topically 2 (two) times daily.  Dispense: 30 g; Refill: 0   Hyperlipidemia Family history of cardiovascular disease Family history of hyperlipidemia 05/22/2017: See HPI from today and prior visit for detail.  He has made significant diet changes.  Will wait to recheck lipid panel in the future after he is continued diet changes.   Murray Hodgkins [place], [place], [facility] 08/24/2017 4:42 PM

## 2017-10-02 ENCOUNTER — Other Ambulatory Visit: Payer: Self-pay | Admitting: Physician Assistant

## 2017-10-02 DIAGNOSIS — F988 Other specified behavioral and emotional disorders with onset usually occurring in childhood and adolescence: Secondary | ICD-10-CM

## 2017-10-02 MED ORDER — METHYLPHENIDATE HCL ER (OSM) 54 MG PO TBCR: 54.0000 mg | EXTENDED_RELEASE_TABLET | Freq: Every day | ORAL | 0 refills | Status: DC

## 2017-10-02 NOTE — Telephone Encounter (Signed)
[facility]

## 2017-10-02 NOTE — Telephone Encounter (Signed)
Last OV 08/24/2017 Last refill 08/09/2017 Ok to refill?

## 2017-11-01 ENCOUNTER — Ambulatory Visit: Payer: 59 | Admitting: Physician Assistant

## 2017-11-01 ENCOUNTER — Encounter: Payer: Self-pay | Admitting: Physician Assistant

## 2017-11-01 VITALS — BP 116/80 | HR 102 | Temp 97.7°F | Resp 18 | Ht 69.0 in | Wt 254.8 lb

## 2017-11-01 DIAGNOSIS — K219 Gastro-esophageal reflux disease without esophagitis: Secondary | ICD-10-CM | POA: Insufficient documentation

## 2017-11-01 DIAGNOSIS — F988 Other specified behavioral and emotional disorders with onset usually occurring in childhood and adolescence: Secondary | ICD-10-CM

## 2017-11-01 DIAGNOSIS — J3081 Allergic rhinitis due to animal (cat) (dog) hair and dander: Secondary | ICD-10-CM

## 2017-11-01 DIAGNOSIS — L729 Follicular cyst of the skin and subcutaneous tissue, unspecified: Secondary | ICD-10-CM

## 2017-11-01 MED ORDER — CEPHALEXIN 500 MG PO CAPS: 500.0000 mg | ORAL_CAPSULE | Freq: Four times a day (QID) | ORAL | 0 refills | Status: AC

## 2017-11-01 MED ORDER — CETIRIZINE HCL 10 MG PO CAPS: 1.0000 | ORAL_CAPSULE | Freq: Every day | ORAL | 3 refills | Status: DC | PRN

## 2017-11-01 MED ORDER — AZELASTINE HCL 0.1 % NA SOLN: 2.0000 | Freq: Two times a day (BID) | NASAL | 12 refills | Status: DC

## 2017-11-01 MED ORDER — METHYLPHENIDATE HCL ER (OSM) 54 MG PO TBCR: 54.0000 mg | EXTENDED_RELEASE_TABLET | Freq: Every day | ORAL | 0 refills | Status: DC

## 2017-11-01 MED ORDER — MONTELUKAST SODIUM 10 MG PO TABS: 10.0000 mg | ORAL_TABLET | Freq: Every day | ORAL | 2 refills | Status: DC

## 2017-11-01 MED ORDER — OMEPRAZOLE 20 MG PO CPDR: 20.0000 mg | DELAYED_RELEASE_CAPSULE | Freq: Every day | ORAL | 11 refills | Status: DC

## 2017-11-01 NOTE — Progress Notes (Signed)
Patient ID: Eric Hodge MRN: [id number], DOB: 1985/06/04, 32 y.o. Date of Encounter: @DATE @  Chief Complaint:  Chief Complaint  Patient presents with  . Referral    allergy problems  . Medication Refill  . spot on back  . throat and chest discomfort    HPI: 32 y.o. year old male  presents with above.    1- needs refill on ADD med.  Methylphenidate 54 mg.  This is working well at controlling his ADD and is causing no adverse effects.  2 - regarding chief complaint above for "throat and chest discomfort "----he states that this is his heartburn.  States that he has been having heartburn symptoms recently and has been using over-the-counter Tums as needed.  Has used no other treatment.  3 -- regarding the "spot on his back" ----he states that he has felt a "bump" there for months.  States that it never bothered him until just recently over the past few days.  Has noticed it feeling a little bit sore there.  4 --allergies: He states that whenever he goes to his parents house  "his allergies act up ".  States that his mom smokes.  Reports they also have multiple cats dogs birds in the house.  States that he only goes there about once a week now.  Disease in the past his mom was keeping his child and then he was having to go there daily (.  However when he does go there he is having significant allergy symptoms that he says is gotten worse recently.  Says now after he has been there for just 10 to 15 minutes he is having significant symptoms.  Eyes get itchy and watery his nose gets itchy and watery and he has multiple episodes of sneezing. States that he "cannot use Benadryl because that makes him drowsy ".  Has been taking Zyrtec every morning. Has used no other treatment.     Past Medical History:  Diagnosis Date  . Retained orthopedic hardware 02/2017   right distal radius  . Runny nose 03/06/2017   clear drainage, per pt.     Home Meds: Outpatient Medications Prior to  Visit  Medication Sig Dispense Refill  . diclofenac (VOLTAREN) 75 MG EC tablet Take 75 mg by mouth as needed. Knee pain    . HYDROcodone-acetaminophen (NORCO) 10-325 MG tablet Take 1 tablet by mouth every 6 (six) hours as needed.    Marland Kitchen ketoconazole (NIZORAL) 2 % cream Apply 1 application topically daily. 15 g 0  . triamcinolone ointment (KENALOG) 0.5 % Apply 1 application topically 2 (two) times daily. 30 g 0  . methylphenidate 54 MG PO CR tablet Take 1 tablet (54 mg total) by mouth daily. 30 tablet 0   No facility-administered medications prior to visit.     Allergies: No Known Allergies  Social History   Socioeconomic History  . Marital status: Divorced    Spouse name: Not on file  . Number of children: Not on file  . Years of education: Not on file  . Highest education level: Not on file  Occupational History  . Not on file  Social Needs  . Financial resource strain: Not on file  . Food insecurity:    Worry: Not on file    Inability: Not on file  . Transportation needs:    Medical: Not on file    Non-medical: Not on file  Tobacco Use  . Smoking status: Current Every Day Smoker  Packs/day: 0.50    Years: 10.00    Pack years: 5.00    Types: Cigarettes  . Smokeless tobacco: Never Used  Substance and Sexual Activity  . Alcohol use: Yes    Comment: occasionally  . Drug use: No  . Sexual activity: Not Currently  Lifestyle  . Physical activity:    Days per week: Not on file    Minutes per session: Not on file  . Stress: Not on file  Relationships  . Social connections:    Talks on phone: Not on file    Gets together: Not on file    Attends religious service: Not on file    Active member of club or organization: Not on file    Attends meetings of clubs or organizations: Not on file    Relationship status: Not on file  . Intimate partner violence:    Fear of current or ex partner: Not on file    Emotionally abused: Not on file    Physically abused: Not on file     Forced sexual activity: Not on file  Other Topics Concern  . Not on file  Social History Narrative   Works as Nurse, children's.   No routine exercise.   In [employer] PT tests--will exercise prior to these      He has a 2 y/o son---currently pt has him Tuesday-Friday but says 'that may change"   Rest of time, child is with his mom--in [place]    Family History  Problem Relation Age of Onset  . Stroke Paternal Grandmother   . Hypertension Paternal Grandmother   . Cancer Paternal Grandfather   . Hyperlipidemia Paternal Grandfather   . Coronary artery disease Father 12       At 79 had MI --> stent. 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 rate 18, height 5\' 9"  (1.753 m), weight 115.6 kg, SpO2 96 %., Body mass index is 37.63 kg/m. General: Appears in no acute distress. Head: Normocephalic, atraumatic, eyes without discharge, sclera non-icteric, nares are without discharge. Bilateral auditory canals clear, TM's are without perforation, pearly grey and translucent with reflective cone of light bilaterally. Oral cavity moist, posterior pharynx without exudate, erythema, peritonsillar abscess, or post nasal drip.  Neck: Supple. No thyromegaly. No lymphadenopathy. Lungs: Clear bilaterally to auscultation without wheezes, rales, or rhonchi. Breathing is unlabored. Heart: RRR with S1 S2. No murmurs, rubs, or gallops. Abdomen: Soft, non-tender, non-distended with normoactive bowel sounds. No hepatomegaly. No rebound/guarding. No obvious abdominal masses. Musculoskeletal:  Strength and tone normal for age. Extremities/Skin: Warm and dry. No clubbing or cyanosis. No edema. No rashes or suspicious lesions. Neuro: Alert and oriented X 3. Moves all extremities spontaneously. Gait is normal. CNII-XII grossly in tact. Psych:  Responds to questions appropriately with  a normal affect.     ASSESSMENT AND PLAN:  32 y.o. year old male with  1. Allergic rhinitis due to animal hair and dander  - Cetirizine HCl (ZYRTEC ALLERGY) 10 MG CAPS; Take 1 capsule (10 mg total) by mouth daily as needed.  Dispense: 90 capsule; Refill: 3 - montelukast (SINGULAIR) 10 MG tablet; Take 1 tablet (10 mg total) by mouth daily. As needed for allergies.  Dispense: 30 tablet; Refill: 2 - azelastine (ASTELIN) 0.1 % nasal spray; Place 2 sprays into both nostrils 2 (two) times daily. Use  in each nostril as directed  Dispense: 30 mL; Refill: 12  2. Attention deficit disorder (ADD) without hyperactivity   3. Cyst of skin  - cephALEXin (KEFLEX) 500 MG capsule; Take 1 capsule (500 mg total) by mouth 4 (four) times daily for 10 days.  Dispense: 40 capsule; Refill: 0  4. Gastroesophageal reflux disease, esophagitis presence not specified   - omeprazole (PRILOSEC) 20 MG capsule; Take 1 capsule (20 mg total) by mouth daily.  Dispense: 30 capsule; Refill: 11  5. Attention deficit disorder, unspecified hyperactivity presence  - methylphenidate 54 MG PO CR tablet; Take 1 tablet (54 mg total) by mouth daily.  Dispense: 30 tablet; Refill: 0   Signed, [address] [place], [place], [facility] 11/01/2017 8:52 AM  Provider who saw the patient has left the practice.  Therefore I am unable to complete the note as I did not see the patient.  He will not be charged an office visit as his note is in complete.  I will be closed at this time

## 2017-11-08 ENCOUNTER — Telehealth: Payer: Self-pay

## 2017-11-08 NOTE — Telephone Encounter (Signed)
Call was placed to patient to follow up with him regarding his office visit on 11/01/2017. Patient states after his visit with Frazier Richards he does not feel a referral is needed at this time.

## 2017-11-20 ENCOUNTER — Telehealth: Payer: Self-pay

## 2017-11-20 ENCOUNTER — Other Ambulatory Visit: Payer: Self-pay

## 2017-11-20 ENCOUNTER — Encounter (HOSPITAL_COMMUNITY): Payer: Self-pay | Admitting: *Deleted

## 2017-11-20 ENCOUNTER — Ambulatory Visit (HOSPITAL_COMMUNITY)
Admission: EM | Admit: 2017-11-20 | Discharge: 2017-11-20 | Disposition: A | Discharge status: Home / Self Care | Payer: 59 | Attending: Family Medicine | Admitting: Family Medicine

## 2017-11-20 DIAGNOSIS — L089 Local infection of the skin and subcutaneous tissue, unspecified: Secondary | ICD-10-CM

## 2017-11-20 DIAGNOSIS — L729 Follicular cyst of the skin and subcutaneous tissue, unspecified: Secondary | ICD-10-CM | POA: Diagnosis not present

## 2017-11-20 MED ORDER — MUPIROCIN 2 % EX OINT: 1.0000 "application " | TOPICAL_OINTMENT | Freq: Two times a day (BID) | CUTANEOUS | 0 refills | Status: DC

## 2017-11-20 NOTE — Discharge Instructions (Signed)
You can remove current dressing in 24 hours. Keep wound clean and dry. You can clean gently with soap and water. Do not soak area in water. Use bactroban to dress the wound. Continue heat compress. Monitor for spreading redness, increased warmth, increased swelling, fever, follow up for reevaluation needed.

## 2017-11-20 NOTE — Telephone Encounter (Signed)
Patient called and states the cyst that was on his back at last office visit has bursted and blood is leaking from it. Patient was seen in office on 11/01/2017 for the cyst and prescribed antibiotics. Patient states the site is not painful , but that it is bleeding. Due to no available appointments patient was advised to go to an Urgent Care for treatment. Patient verbalizes understanding

## 2017-11-20 NOTE — ED Triage Notes (Signed)
States he had a cyst on his back was seen by PCP was antibiotics and states he took for 10 day, states it started draining yest.

## 2017-11-20 NOTE — ED Provider Notes (Signed)
[facility]    CSN: [id number] Arrival date & time: 11/20/17  1537     History   Chief Complaint Chief Complaint  Patient presents with  . Abscess    HPI Eric Hodge is a 32 y.o. male.   32 year old male comes in for draining abscess. States cyst has been present for months, started having pain and erythema recently. Patient saw his PCP, and was given keflex for 10 days, which mildly helped the symptoms. He finished the medication 2-3 days ago and noticed area started draining yesterday. Given location, has not been able to monitor for spreading redness. Denies fever, chills, night sweats.      Past Medical History:  Diagnosis Date  . Retained orthopedic hardware 02/2017   right distal radius  . Runny nose 03/06/2017   clear drainage, per pt.    Patient Active Problem List   Diagnosis Date Noted  . GERD (gastroesophageal reflux disease) 11/01/2017  . Family history of cardiovascular disease 04/20/2017  . Family history of hyperlipidemia 04/20/2017  . Allergic rhinitis 03/04/2015  . Hyperlipidemia 12/26/2013  . ADD (attention deficit disorder) 11/26/2013  . Smoker 11/26/2013    Past Surgical History:  Procedure Laterality Date  . CARPAL TUNNEL RELEASE Right 10/06/2016   Procedure: CARPAL TUNNEL RELEASE;  Surgeon: Betha Loa, MD;  Location: [facility];  Service: Orthopedics;  Laterality: Right;  . FOREIGN BODY REMOVAL  09/08/2011   Procedure: REMOVAL FOREIGN BODY EXTREMITY;  Surgeon: Tami Ribas, MD;  Location: [facility];  Service: Orthopedics;  Laterality: Left;  FOREIGN BODY REMOVAL LEFT LONG FINGER  . HARDWARE REMOVAL Right 03/10/2017   Procedure: HARDWARE REMOVAL RIGHT WRIST;  Surgeon: Betha Loa, MD;  Location: [facility];  Service: Orthopedics;  Laterality: Right;  . MASS EXCISION Left 10/29/2015   Procedure: LEFT WRIST EXCISION MASS;  Surgeon: Betha Loa, MD;  Location: [facility];   Service: Orthopedics;  Laterality: Left;  LEFT WRIST EXCISION MASS  . OPEN REDUCTION INTERNAL FIXATION (ORIF) DISTAL RADIAL FRACTURE Right 10/06/2016   Procedure: OPEN REDUCTION INTERNAL FIXATION (ORIF) RIGHT DISTAL RADIUS;  Surgeon: Betha Loa, MD;  Location: [facility];  Service: Orthopedics;  Laterality: Right;  . WISDOM TOOTH EXTRACTION  2012       Home Medications    Prior to Admission medications   Medication Sig Start Date End Date Taking? Authorizing Provider  azelastine (ASTELIN) 0.1 % nasal spray Place 2 sprays into both nostrils 2 (two) times daily. Use in each nostril as directed 11/01/17   Dorena Bodo, PA-C  Cetirizine HCl (ZYRTEC ALLERGY) 10 MG CAPS Take 1 capsule (10 mg total) by mouth daily as needed. 11/01/17   Dorena Bodo, PA-C  diclofenac (VOLTAREN) 75 MG EC tablet Take 75 mg by mouth as needed. Knee pain    [provider]  HYDROcodone-acetaminophen (NORCO) 10-325 MG tablet Take 1 tablet by mouth every 6 (six) hours as needed.    [provider]  ketoconazole (NIZORAL) 2 % cream Apply 1 application topically daily. 08/24/17   Dorena Bodo, PA-C  methylphenidate 54 MG PO CR tablet Take 1 tablet (54 mg total) by mouth daily. 11/01/17 11/01/18  Allayne Butcher B, PA-C  montelukast (SINGULAIR) 10 MG tablet Take 1 tablet (10 mg total) by mouth daily. As needed for allergies. 11/01/17 11/01/18  Dorena Bodo, PA-C  mupirocin ointment (BACTROBAN) 2 % Apply 1 application topically 2 (two) times daily.  11/20/17   Cathie Hoops, Latrese Carolan V, PA-C  omeprazole (PRILOSEC) 20 MG capsule Take 1 capsule (20 mg total) by mouth daily. 11/01/17 11/01/18  Allayne Butcher B, PA-C  triamcinolone ointment (KENALOG) 0.5 % Apply 1 application topically 2 (two) times daily. 08/24/17   Dorena Bodo, PA-C    Family History Family History  Problem Relation Age of Onset  . Stroke Paternal Grandmother   . Hypertension Paternal Grandmother   . Cancer Paternal Grandfather   .  Hyperlipidemia Paternal Grandfather   . Coronary artery disease Father 77       At 89 had MI --> stent. This was his 1st dx CAD    Social History Social History   Tobacco Use  . Smoking status: Current Every Day Smoker    Packs/day: 0.50    Years: 10.00    Pack years: 5.00    Types: Cigarettes  . Smokeless tobacco: Never Used  Substance Use Topics  . Alcohol use: Yes    Comment: occasionally  . Drug use: No     Allergies   Patient has no known allergies.   Review of Systems Review of Systems  Reason unable to perform ROS: See HPI as above.     Physical Exam Triage Vital Signs ED Triage Vitals  Enc Vitals Group     BP 11/20/17 1552 138/79     Pulse Rate 11/20/17 1552 98     Resp 11/20/17 1552 18     Temp 11/20/17 1552 97.9 F (36.6 C)     Temp Source 11/20/17 1552 Oral     SpO2 11/20/17 1552 98 %     Weight --      Height --      Head Circumference --      Peak Flow --      Pain Score 11/20/17 1554 0     Pain Loc --      Pain Edu? --      Excl. in GC? --    No data found.  Updated Vital Signs BP 138/79 (BP Location: Right Arm)   Pulse 98   Temp 97.9 F (36.6 C) (Oral)   Resp 18   SpO2 98%   Physical Exam  Constitutional: He is oriented to person, place, and time. He appears well-developed and well-nourished. No distress.  HENT:  Head: Normocephalic and atraumatic.  Eyes: Pupils are equal, round, and reactive to light. Conjunctivae are normal.  Neurological: He is alert and oriented to person, place, and time.  Skin: He is not diaphoretic.  1.5cm x 1cm cyst to the right mid back with surrounding erythema. Mild fluctuance felt. Clear drainage seen without obvious opening wound. No warmth, no tenderness to palpation.      UC Treatments / Results  Labs (all labs ordered are listed, but only abnormal results are displayed) Labs Reviewed - No data to display  EKG None  Radiology No results found.  Procedures Incision and  Drainage Date/Time: 11/20/2017 5:25 PM Performed by: Belinda Fisher, PA-C Authorized by: Eustace Moore, MD   Consent:    Consent obtained:  Verbal   Consent given by:  Patient   Risks discussed:  Bleeding, incomplete drainage, infection, damage to other organs and pain   Alternatives discussed:  Alternative treatment Location:    Type:  Cyst   Size:  1.5cm x 1cm   Location:  Trunk   Trunk location:  Back Pre-procedure details:    Skin preparation:  Hibiclens Anesthesia (see MAR  for exact dosages):    Anesthesia method:  Local infiltration   Local anesthetic:  Lidocaine 2% WITH epi Procedure type:    Complexity:  Simple Procedure details:    Incision types:  Single straight   Incision depth:  Dermal   Scalpel blade:  11   Wound management:  Probed and deloculated   Drainage:  Serosanguinous   Drainage amount:  Scant   Wound treatment:  Wound left open   Packing materials:  None Post-procedure details:    Patient tolerance of procedure:  Tolerated well, no immediate complications   (including critical care time)  Medications Ordered in UC Medications - No data to display  Initial Impression / Assessment and Plan / UC Course  I have reviewed the triage vital signs and the nursing notes.  Pertinent labs & imaging results that were available during my care of the patient were reviewed by me and considered in my medical decision making (see chart for details).    Patient tolerated procedure well. Wound care instructions given. Return precautions given. Patient expresses understanding and agrees to plan.   Final Clinical Impressions(s) / UC Diagnoses   Final diagnoses:  Infected cyst of skin    ED Prescriptions    Medication Sig Dispense Auth. Provider   mupirocin ointment (BACTROBAN) 2 % Apply 1 application topically 2 (two) times daily. 22 g Threasa Alpha, [place] 11/20/17 1726

## 2017-12-04 ENCOUNTER — Telehealth: Payer: Self-pay | Admitting: Physician Assistant

## 2017-12-04 DIAGNOSIS — F988 Other specified behavioral and emotional disorders with onset usually occurring in childhood and adolescence: Secondary | ICD-10-CM

## 2017-12-04 MED ORDER — METHYLPHENIDATE HCL ER (OSM) 54 MG PO TBCR: 54.0000 mg | EXTENDED_RELEASE_TABLET | Freq: Every day | ORAL | 0 refills | Status: DC

## 2017-12-04 NOTE — Telephone Encounter (Signed)
[facility] [address]

## 2018-01-16 ENCOUNTER — Telehealth: Payer: Self-pay | Admitting: Physician Assistant

## 2018-01-16 NOTE — Telephone Encounter (Signed)
[facility] [address]  °

## 2018-01-18 ENCOUNTER — Other Ambulatory Visit: Payer: Self-pay | Admitting: Family Medicine

## 2018-01-18 DIAGNOSIS — F988 Other specified behavioral and emotional disorders with onset usually occurring in childhood and adolescence: Secondary | ICD-10-CM

## 2018-01-18 MED ORDER — METHYLPHENIDATE HCL ER (OSM) 54 MG PO TBCR: 54.0000 mg | EXTENDED_RELEASE_TABLET | Freq: Every day | ORAL | 0 refills | Status: DC

## 2018-01-26 ENCOUNTER — Other Ambulatory Visit: Payer: Self-pay | Admitting: Family Medicine

## 2018-01-26 DIAGNOSIS — J3081 Allergic rhinitis due to animal (cat) (dog) hair and dander: Secondary | ICD-10-CM

## 2018-01-26 MED ORDER — MONTELUKAST SODIUM 10 MG PO TABS: 10.0000 mg | ORAL_TABLET | Freq: Every day | ORAL | 2 refills | Status: DC

## 2018-02-22 ENCOUNTER — Ambulatory Visit: Payer: 59 | Admitting: Family Medicine

## 2018-02-22 ENCOUNTER — Encounter: Payer: Self-pay | Admitting: Family Medicine

## 2018-02-22 ENCOUNTER — Ambulatory Visit: Payer: 59 | Admitting: Physician Assistant

## 2018-02-22 ENCOUNTER — Ambulatory Visit (INDEPENDENT_AMBULATORY_CARE_PROVIDER_SITE_OTHER): Payer: 59 | Admitting: Family Medicine

## 2018-02-22 VITALS — BP 100/64 | HR 93 | Temp 98.4°F | Resp 16 | Ht 69.0 in | Wt 256.4 lb

## 2018-02-22 DIAGNOSIS — R5383 Other fatigue: Secondary | ICD-10-CM | POA: Diagnosis not present

## 2018-02-22 DIAGNOSIS — R6882 Decreased libido: Secondary | ICD-10-CM | POA: Diagnosis not present

## 2018-02-22 DIAGNOSIS — E785 Hyperlipidemia, unspecified: Secondary | ICD-10-CM

## 2018-02-22 DIAGNOSIS — F902 Attention-deficit hyperactivity disorder, combined type: Secondary | ICD-10-CM

## 2018-02-22 MED ORDER — LISDEXAMFETAMINE DIMESYLATE 50 MG PO CAPS: 50.0000 mg | ORAL_CAPSULE | ORAL | 0 refills | Status: DC

## 2018-02-22 MED ORDER — LISDEXAMFETAMINE DIMESYLATE 50 MG PO CAPS: 50.0000 mg | ORAL_CAPSULE | Freq: Every day | ORAL | 0 refills | Status: DC

## 2018-02-22 NOTE — Patient Instructions (Signed)
Come back in the next week to get fasting labs done  Call me in one month to let me know how vyvanse is working for you.

## 2018-02-22 NOTE — Progress Notes (Signed)
Patient ID: Eric Hodge, male    DOB: 1985-06-16, 33 y.o.   MRN: [id number]  PCP: Danelle Berryapia, Jaan Fischel, PA-C  Chief Complaint  Patient presents with  . ADHD    Patient in today for 6 month follow up. Patient is not fasting    Subjective:   Eric Bossierorbert Suk is a 33 y.o. male, presents to clinic with CC of ADHD follow up and med check.  He is a pt of [facility], who recently left practice, switched to my panel, this OV is the first I have seen him.  I have reviewed most recent OV 08/24/17  Concerta 54 mg- takes in the morning, he eats breakfast, and is sleepy all day long which is new over the past 1-2 months, never had before, wants to know if it could be a med side effect.  He's taken concerta and vyvanse in the past He is sleeping well at night, getting about 6-9 hours No racing HR, no CP, no weight changes Occasionally dry mouth but he drinks a lot of water. Weights:  No big weight change since last visit Wt Readings from Last 5 Encounters:  02/22/18 256 lb 6 oz (116.3 kg)  11/01/17 254 lb 12.8 oz (115.6 kg)  08/24/17 255 lb 6.4 oz (115.8 kg)  05/22/17 254 lb (115.2 kg)  04/20/17 264 lb (119.7 kg)   Pt does complain that in addition to just being excessively tired all day long he also has decreased libido and decreased drive to do almost anything he also is concerned about thinning hair on his head and he asked if his testosterone could be checked.  He denies any hair loss to his arms or legs.  No bowel changes, swelling, not depressed.  He wants to schedule CPE soon and he has hx of HLD wants to have rechecked soon - he did eat some cake or pie this am.                  Patient Active Problem List   Diagnosis Date Noted  . GERD (gastroesophageal reflux disease) 11/01/2017  . Family history of cardiovascular disease 04/20/2017  . Family history of hyperlipidemia 04/20/2017  . Allergic rhinitis 03/04/2015  . Hyperlipidemia 12/26/2013  . ADD (attention deficit disorder) 11/26/2013  .  Smoker 11/26/2013    Current Meds  Medication Sig  . azelastine (ASTELIN) 0.1 % nasal spray Place 2 sprays into both nostrils 2 (two) times daily. Use in each nostril as directed  . Cetirizine HCl (ZYRTEC ALLERGY) 10 MG CAPS Take 1 capsule (10 mg total) by mouth daily as needed.  . diclofenac (VOLTAREN) 75 MG EC tablet Take 75 mg by mouth as needed. Knee pain  . HYDROcodone-acetaminophen (NORCO) 10-325 MG tablet Take 1 tablet by mouth every 6 (six) hours as needed.  Marland Kitchen. ketoconazole (NIZORAL) 2 % cream Apply 1 application topically daily.  . methylphenidate 54 MG PO CR tablet Take 1 tablet (54 mg total) by mouth daily.  . montelukast (SINGULAIR) 10 MG tablet Take 1 tablet (10 mg total) by mouth daily. As needed for allergies.  . mupirocin ointment (BACTROBAN) 2 % Apply 1 application topically 2 (two) times daily.  Marland Kitchen. omeprazole (PRILOSEC) 20 MG capsule Take 1 capsule (20 mg total) by mouth daily.  Marland Kitchen. triamcinolone ointment (KENALOG) 0.5 % Apply 1 application topically 2 (two) times daily.     Review of Systems  Constitutional: Negative.   HENT: Negative.   Eyes: Negative.   Respiratory: Negative.  Cardiovascular: Negative.   Gastrointestinal: Negative.   Endocrine: Negative.   Genitourinary: Negative.   Musculoskeletal: Negative.   Skin: Negative.   Allergic/Immunologic: Negative.   Neurological: Negative.   Hematological: Negative.   Psychiatric/Behavioral: Negative.   All other systems reviewed and are negative.      Objective:    Vitals:   02/22/18 1151  BP: 100/64  Pulse: 93  Resp: 16  Temp: 98.4 F (36.9 C)  TempSrc: Oral  SpO2: 97%  Weight: 256 lb 6 oz (116.3 kg)  Height: 5\' 9"  (1.753 m)      Physical Exam Vitals signs and nursing note reviewed.  Constitutional:      General: He is not in acute distress.    Appearance: He is well-developed. He is obese. He is not ill-appearing, toxic-appearing or diaphoretic.  HENT:     Head: Normocephalic and atraumatic.      Right Ear: External ear normal.     Left Ear: External ear normal.     Nose: Nose normal. No congestion or rhinorrhea.     Mouth/Throat:     Mouth: Mucous membranes are moist.     Pharynx: Oropharynx is clear.  Eyes:     General: No scleral icterus.       Right eye: No discharge.        Left eye: No discharge.     Conjunctiva/sclera: Conjunctivae normal.     Pupils: Pupils are equal, round, and reactive to light.  Neck:     Trachea: No tracheal deviation.  Cardiovascular:     Rate and Rhythm: Normal rate and regular rhythm.     Pulses: Normal pulses.     Heart sounds: Normal heart sounds. No murmur. No friction rub. No gallop.   Pulmonary:     Effort: Pulmonary effort is normal. No respiratory distress.     Breath sounds: Normal breath sounds. No stridor.  Abdominal:     General: Bowel sounds are normal.  Musculoskeletal: Normal range of motion.  Skin:    General: Skin is warm and dry.     Findings: No rash.  Neurological:     General: No focal deficit present.     Mental Status: He is alert.     Motor: No abnormal muscle tone.     Coordination: Coordination normal.  Psychiatric:        Mood and Affect: Mood normal.        Behavior: Behavior normal.       Assessment & Plan:   33 y/o male here for med recheck for ADHD, he's compliant, doses fairly stable, has been on Vyvanse and Concerta in the past.  He is having some sleepiness and fatigue which is new and he is never had this before unclear if it is a medication side effect or something else he also does complain of some thinning hair and decreased libido.  We will try to switch back to Vyvanse 50 mg -patient will follow-up in 1 month to let me know how this is working, he has been on 50 to 70 mg of Vyvanse in the past  Clinically here today well-appearing obese young man heart rate is regular he is not having any concerning side effects.  He has had no drastic weight changes.  He is due for physical coming up and  does have a history of hyperlipidemia, since he is eaten this morning I will put in labs to recheck his hyperlipidemia, rule out anemia, hypothyroid, low T, screening fasting  blood sugar.    Problem List Items Addressed This Visit      Other   ADD (attention deficit disorder) - Primary   Hyperlipidemia   Relevant Medications   lisdexamfetamine (VYVANSE) 50 MG capsule   Other Relevant Orders   COMPLETE METABOLIC PANEL WITH GFR   TSH   T4, Free   Testosterone   Lipid Panel   CBC with Differential    Other Visit Diagnoses    Fatigue, unspecified type       Relevant Medications   lisdexamfetamine (VYVANSE) 50 MG capsule   Other Relevant Orders   COMPLETE METABOLIC PANEL WITH GFR   TSH   T4, Free   Testosterone   Lipid Panel   CBC with Differential   Decreased libido       Relevant Medications   lisdexamfetamine (VYVANSE) 50 MG capsule   Other Relevant Orders   COMPLETE METABOLIC PANEL WITH GFR   TSH   T4, Free   Testosterone   Lipid Panel   CBC with Differential          Danelle Berry, PA-C 02/22/18 12:04 PM

## 2018-02-26 ENCOUNTER — Telehealth: Payer: Self-pay | Admitting: Family Medicine

## 2018-02-26 NOTE — Telephone Encounter (Signed)
Patient called in stating that his pharmacy did not receive his prescription for Vyvanse. Called pharmacy and verified and they did not receive the prescription due to a power outages. Could you please resend the prescription.

## 2018-02-27 MED ORDER — LISDEXAMFETAMINE DIMESYLATE 50 MG PO CAPS: 50.0000 mg | ORAL_CAPSULE | ORAL | 0 refills | Status: DC

## 2018-02-27 NOTE — Telephone Encounter (Signed)
E-prescribe not transmitted to pharmacy x 2 - verified with pharmacy that their system was down - Rx sent again

## 2018-02-28 ENCOUNTER — Other Ambulatory Visit: Payer: 59

## 2018-02-28 DIAGNOSIS — E785 Hyperlipidemia, unspecified: Secondary | ICD-10-CM

## 2018-02-28 DIAGNOSIS — R5383 Other fatigue: Secondary | ICD-10-CM

## 2018-02-28 DIAGNOSIS — R6882 Decreased libido: Secondary | ICD-10-CM | POA: Diagnosis not present

## 2018-03-01 ENCOUNTER — Other Ambulatory Visit: Payer: Self-pay | Admitting: Family Medicine

## 2018-03-01 DIAGNOSIS — R945 Abnormal results of liver function studies: Principal | ICD-10-CM

## 2018-03-01 DIAGNOSIS — R7989 Other specified abnormal findings of blood chemistry: Secondary | ICD-10-CM | POA: Insufficient documentation

## 2018-03-01 LAB — TSH: TSH: 1.17 mIU/L (ref 0.40–4.50)

## 2018-03-01 LAB — CBC WITH DIFFERENTIAL/PLATELET
ABSOLUTE MONOCYTES: 511 {cells}/uL (ref 200–950)
BASOS ABS: 63 {cells}/uL (ref 0–200)
Basophils Relative: 0.9 %
EOS ABS: 161 {cells}/uL (ref 15–500)
Eosinophils Relative: 2.3 %
HEMATOCRIT: 47.2 % (ref 38.5–50.0)
Hemoglobin: 16.5 g/dL (ref 13.2–17.1)
Lymphs Abs: 1764 cells/uL (ref 850–3900)
MCH: 33.1 pg — AB (ref 27.0–33.0)
MCHC: 35 g/dL (ref 32.0–36.0)
MCV: 94.6 fL (ref 80.0–100.0)
MPV: 10.1 fL (ref 7.5–12.5)
Monocytes Relative: 7.3 %
NEUTROS PCT: 64.3 %
Neutro Abs: 4501 cells/uL (ref 1500–7800)
PLATELETS: 220 10*3/uL (ref 140–400)
RBC: 4.99 10*6/uL (ref 4.20–5.80)
RDW: 12.5 % (ref 11.0–15.0)
TOTAL LYMPHOCYTE: 25.2 %
WBC: 7 10*3/uL (ref 3.8–10.8)

## 2018-03-01 LAB — COMPLETE METABOLIC PANEL WITH GFR
AG RATIO: 1.8 (calc) (ref 1.0–2.5)
ALBUMIN MSPROF: 4.4 g/dL (ref 3.6–5.1)
ALT: 104 U/L — ABNORMAL HIGH (ref 9–46)
AST: 44 U/L — ABNORMAL HIGH (ref 10–40)
Alkaline phosphatase (APISO): 89 U/L (ref 36–130)
BUN: 14 mg/dL (ref 7–25)
CO2: 23 mmol/L (ref 20–32)
Calcium: 9.5 mg/dL (ref 8.6–10.3)
Chloride: 108 mmol/L (ref 98–110)
Creat: 1.03 mg/dL (ref 0.60–1.35)
GFR, EST AFRICAN AMERICAN: 112 mL/min/{1.73_m2} (ref >=60)
GFR, EST NON AFRICAN AMERICAN: 97 mL/min/{1.73_m2} (ref >=60)
Globulin: 2.5 g/dL (calc) (ref 1.9–3.7)
Glucose, Bld: 89 mg/dL (ref 65–99)
POTASSIUM: 4.4 mmol/L (ref 3.5–5.3)
SODIUM: 140 mmol/L (ref 135–146)
TOTAL PROTEIN: 6.9 g/dL (ref 6.1–8.1)
Total Bilirubin: 0.6 mg/dL (ref 0.2–1.2)

## 2018-03-01 LAB — LIPID PANEL
Cholesterol: 253 mg/dL — ABNORMAL HIGH (ref <200)
HDL: 39 mg/dL — AB (ref >=40)
LDL Cholesterol (Calc): 185 mg/dL (calc) — ABNORMAL HIGH
Non-HDL Cholesterol (Calc): 214 mg/dL (calc) — ABNORMAL HIGH (ref <130)
Total CHOL/HDL Ratio: 6.5 (calc) — ABNORMAL HIGH (ref <5.0)
Triglycerides: 144 mg/dL (ref <150)

## 2018-03-01 LAB — T4, FREE: FREE T4: 1.2 ng/dL (ref 0.8–1.8)

## 2018-03-01 LAB — TESTOSTERONE: Testosterone: 281 ng/dL (ref 250–827)

## 2018-03-02 ENCOUNTER — Encounter: Payer: Self-pay | Admitting: Family Medicine

## 2018-03-02 ENCOUNTER — Other Ambulatory Visit: Payer: Self-pay | Admitting: Family Medicine

## 2018-03-08 DIAGNOSIS — M25512 Pain in left shoulder: Secondary | ICD-10-CM | POA: Diagnosis not present

## 2018-03-08 DIAGNOSIS — M79601 Pain in right arm: Secondary | ICD-10-CM | POA: Diagnosis not present

## 2018-03-08 DIAGNOSIS — M25531 Pain in right wrist: Secondary | ICD-10-CM | POA: Diagnosis not present

## 2018-03-09 ENCOUNTER — Ambulatory Visit: Payer: Self-pay | Admitting: Family Medicine

## 2018-03-09 ENCOUNTER — Encounter: Payer: Self-pay | Admitting: Family Medicine

## 2018-03-09 ENCOUNTER — Ambulatory Visit (INDEPENDENT_AMBULATORY_CARE_PROVIDER_SITE_OTHER): Payer: 59 | Admitting: Family Medicine

## 2018-03-09 VITALS — BP 120/80 | HR 90 | Temp 98.4°F | Wt 257.0 lb

## 2018-03-09 DIAGNOSIS — E785 Hyperlipidemia, unspecified: Secondary | ICD-10-CM

## 2018-03-09 DIAGNOSIS — Z7289 Other problems related to lifestyle: Secondary | ICD-10-CM

## 2018-03-09 DIAGNOSIS — Z789 Other specified health status: Secondary | ICD-10-CM

## 2018-03-09 DIAGNOSIS — R748 Abnormal levels of other serum enzymes: Secondary | ICD-10-CM | POA: Diagnosis not present

## 2018-03-09 NOTE — Progress Notes (Signed)
Patient ID: Eric Hodge, male    DOB: 09-10-1985, 33 y.o.   MRN: [id number]  PCP: Danelle Berry, PA-C  Chief Complaint  Patient presents with  . Elevated Hepatic Enzymes    Patient in today to discuss elevated liver enzymes.     Subjective:   Eric Hodge is a 33 y.o. male, presents to clinic with CC of elevated LFT's and HLD.  He had done fasting blood work and was to arrange for f/up CPE, but he is here today to review and address abnormal labs and review hx.  He reports today that he has been drinking whiskey - at least 3-5 x a week, usually 3-4 "doubles" and had recently switched to whisky when his friend bought it for him as a gift.  He also just went to the [place] for follow up and he had his labs with him, they decided to stop norco and start tramadol instead.    On pt's med list he has singulair daily, but he is not taking that currently and he only takes zyrtec PRN.    He sometimes takes a chew vitamin, but he denies any other OTC supplements or vitamins.  No other recent meds or antibiotics.    We did switch him a few months ago back onto vyvanse, from methyphenidate.    03/09/18 1549  Alcohol Screening Tool (AUDIT)  1. How often do you have a drink containing alcohol? 3  2. How many drinks containing alcohol do you have on a typical day when you are drinking? 3  3. How often do you have six or more drinks on one occasion? 3  AUDIT-C Score 9  4. How often during the last year have you found that you were not able to stop drinking once you had started? 0  5. How often during the last year have you failed to do what was normally expected from you becasue of drinking? 0  6. How often during the last year have you needed a first drink in the morning to get yourself going after a heavy drinking session? 0  7. How often during the last year have you had a feeling of guilt of remorse after drinking? 0  8. How often during the last year have you been unable to remember what  happened the night before because you had been drinking? 0  9. Have you or someone else been injured as a result of your drinking? 0  10. Has a relative or friend or a doctor or another health worker been concerned about your drinking or suggested you cut down? 0  Alcohol Use Disorder Identification Test Final Score (AUDIT) 9  Alcohol Brief Interventions/Follow-up Alcohol Education;Continued Monitoring      Patient Active Problem List   Diagnosis Date Noted  . Elevated liver enzymes 03/09/2018  . Elevated LFTs 03/01/2018  . GERD (gastroesophageal reflux disease) 11/01/2017  . Family history of cardiovascular disease 04/20/2017  . Family history of hyperlipidemia 04/20/2017  . Allergic rhinitis 03/04/2015  . Hyperlipidemia 12/26/2013  . ADD (attention deficit disorder) 11/26/2013  . Smoker 11/26/2013     Prior to Admission medications   Medication Sig Start Date End Date Taking? Authorizing Provider  azelastine (ASTELIN) 0.1 % nasal spray Place 2 sprays into both nostrils 2 (two) times daily. Use in each nostril as directed 11/01/17  Yes Allayne Butcher B, PA-C  Cetirizine HCl (ZYRTEC ALLERGY) 10 MG CAPS Take 1 capsule (10 mg total) by mouth daily as needed. 11/01/17  Yes Dorena Bodo, PA-C  diclofenac (VOLTAREN) 75 MG EC tablet Take 75 mg by mouth as needed. Knee pain   Yes [provider]  HYDROcodone-acetaminophen (NORCO) 10-325 MG tablet Take 1 tablet by mouth every 6 (six) hours as needed.   Yes [provider]  ketoconazole (NIZORAL) 2 % cream Apply 1 application topically daily. 08/24/17  Yes Dorena Bodo, PA-C  lisdexamfetamine (VYVANSE) 50 MG capsule Take 1 capsule (50 mg total) by mouth every morning. 02/27/18  Yes Lativia Velie, PA-C  montelukast (SINGULAIR) 10 MG tablet Take 1 tablet (10 mg total) by mouth daily. As needed for allergies. 01/26/18 01/26/19 Yes PickardPriscille Heidelberg, MD  mupirocin ointment (BACTROBAN) 2 % Apply 1 application topically 2 (two) times  daily. 11/20/17  Yes Yu, Amy V, PA-C  omeprazole (PRILOSEC) 20 MG capsule Take 1 capsule (20 mg total) by mouth daily. 11/01/17 11/01/18 Yes Allayne Butcher B, PA-C  triamcinolone ointment (KENALOG) 0.5 % Apply 1 application topically 2 (two) times daily. 08/24/17  Yes Allayne Butcher B, PA-C     No Known Allergies   Family History  Problem Relation Age of Onset  . Stroke Paternal Grandmother   . Hypertension Paternal Grandmother   . Cancer Paternal Grandfather   . Hyperlipidemia Paternal Grandfather   . Coronary artery disease Father 51       At 52 had MI --> stent. This was his 1st dx CAD     Social History   Socioeconomic History  . Marital status: Divorced    Spouse name: Not on file  . Number of children: Not on file  . Years of education: Not on file  . Highest education level: Not on file  Occupational History  . Not on file  Social Needs  . Financial resource strain: Not on file  . Food insecurity:    Worry: Not on file    Inability: Not on file  . Transportation needs:    Medical: Not on file    Non-medical: Not on file  Tobacco Use  . Smoking status: Current Every Day Smoker    Packs/day: 0.50    Years: 10.00    Pack years: 5.00    Types: Cigarettes  . Smokeless tobacco: Never Used  Substance and Sexual Activity  . Alcohol use: Yes    Comment: 4-5 days per week  . Drug use: No  . Sexual activity: Not Currently  Lifestyle  . Physical activity:    Days per week: Not on file    Minutes per session: Not on file  . Stress: Not on file  Relationships  . Social connections:    Talks on phone: Not on file    Gets together: Not on file    Attends religious service: Not on file    Active member of club or organization: Not on file    Attends meetings of clubs or organizations: Not on file    Relationship status: Not on file  . Intimate partner violence:    Fear of current or ex partner: Not on file    Emotionally abused: Not on file    Physically abused: Not on  file    Forced sexual activity: Not on file  Other Topics Concern  . Not on file  Social History Narrative   Works as Nurse, children's.   No routine exercise.   In [employer] PT tests--will exercise prior to these      He has  a 2 y/o son---currently pt has him Tuesday-Friday but says 'that may change"   Rest of time, child is with his mom--in [place]     Review of Systems  Constitutional: Negative.   HENT: Negative.   Eyes: Negative.   Respiratory: Negative.   Cardiovascular: Negative.   Gastrointestinal: Negative.   Endocrine: Negative.   Genitourinary: Negative.   Musculoskeletal: Negative.   Skin: Negative.   Allergic/Immunologic: Negative.   Neurological: Negative.   Hematological: Negative.   Psychiatric/Behavioral: Negative.   All other systems reviewed and are negative.      Objective:    Vitals:   03/09/18 1551  BP: 120/80  Pulse: 90  Temp: 98.4 F (36.9 C)  TempSrc: Oral  SpO2: 97%  Weight: 257 lb (116.6 kg)      Physical Exam Vitals signs and nursing note reviewed.  Constitutional:      General: He is not in acute distress.    Appearance: He is well-developed. He is obese. He is not ill-appearing, toxic-appearing or diaphoretic.  HENT:     Head: Normocephalic and atraumatic.     Right Ear: External ear normal.     Left Ear: External ear normal.     Nose: Nose normal.     Mouth/Throat:     Mouth: Mucous membranes are moist.     Pharynx: Oropharynx is clear.  Eyes:     General: No scleral icterus.       Right eye: No discharge.        Left eye: No discharge.     Conjunctiva/sclera: Conjunctivae normal.  Neck:     Musculoskeletal: Normal range of motion.     Trachea: No tracheal deviation.  Cardiovascular:     Rate and Rhythm: Normal rate and regular rhythm.     Pulses: Normal pulses.     Heart sounds: Normal heart sounds. No murmur. No friction rub. No gallop.   Pulmonary:     Effort: Pulmonary effort is  normal. No respiratory distress.     Breath sounds: Normal breath sounds. No stridor.  Abdominal:     General: Bowel sounds are normal. There is no distension.     Palpations: Abdomen is soft. There is no hepatomegaly or pulsatile mass.     Tenderness: There is no abdominal tenderness. There is no guarding or rebound.  Musculoskeletal: Normal range of motion.  Skin:    General: Skin is warm and dry.     Coloration: Skin is not jaundiced.     Findings: No rash.  Neurological:     Mental Status: He is alert.     Motor: No abnormal muscle tone.     Coordination: Coordination normal.  Psychiatric:        Behavior: Behavior normal.            Assessment & Plan:      ICD-10-CM   1. Elevated liver enzymes R74.8 US Abdomen Limited RUQ    Hepatitis panel, acute  2. Hyperlipidemia, unspecified hyperlipidemia type E78.5 US Abdomen Limited RUQ    Hepatitis panel, acute   Hepatitis panel obtained today - he just [place] [facility] [place] [facility] - he will try to get record Suspect elevated LFT's from ETOH, gradually decrease, avoid hard liquor frequently Avoid tylenol - [facility] change [facility] pain med[facility], I expect pain to be more difficult with tramadol Avoid singulair currently Cholesterol diet, exercise and weight loss discussed We will determine +/- statin after RUQ US and hepatitis panel result.  Danelle Berry, PA-C 03/09/18 8:01 PM

## 2018-03-09 NOTE — Patient Instructions (Addendum)
Cut back on alcohol  Avoid any tylenol  Let me know about any additional supplements or prescriptions not on your list  They will call you about the ultrasound  And once I get results we may start a cholesterol medicine and recheck you in a month.  Liver Function Tests Why am I having this test? Liver function tests are done to see how well your liver is working. The proteins and enzymes measured in the test can alert your health care provider to inflammation, damage, or disease in your liver. It is common to have liver function tests:  When you are taking certain medicines.  If you have liver disease.  If you drink a lot of alcohol.  When you are not feeling well.  When you have other conditions that may affect your liver.  During annual physical exams.  If you have symptoms such as yellowing of the skin (jaundice), abdominal pain, or nausea and vomiting. What is being tested? These tests measure various substances in your blood. This may include:  Alanine transaminase (ALT). This is an enzyme in the liver.  Aspartate transaminase (AST). This is an enzyme in the liver, heart, and muscles.  Alkaline phosphatase (ALP). This is a protein in the liver, bile ducts, bone, and other body tissues.  Total bilirubin. This is a yellow pigment in bile.  Albumin. This is a protein in the liver.  Prothrombin time and international normalized ratio (PT and INR). PT measures the time it takes for your blood to clot. INR is a calculation of blood clotting time based on your PT result. It is also calculated based on normal ranges defined by the lab that processed your test.  Total protein. This includes two proteins, albumin and globulin, found in the blood. What kind of sample is taken?  A blood sample is required for this test. It is usually collected by inserting a needle into a blood vessel. How do I prepare for this test? How you prepare will depend on which tests are being done  and the reason for doing them. You may need to:  Avoid eating for 4-6 hours before the test, or as told by your health care provider.  Stop taking certain medicines before your blood test, as told by your health care provider. Tell a health care provider about:  All medicines you are taking, including vitamins, herbs, eye drops, creams, and over-the-counter medicines.  Any medical conditions you have.  Whether you are pregnant or may be pregnant. How are the results reported? Your test results will be reported as values. Your health care provider will compare your results to normal ranges that were established after testing a large group of people (reference ranges). Reference ranges may vary among labs and hospitals. For the substances measured in liver function tests, common reference ranges are: ALT  Infant: 10-40 international units/L.  Child or adult: 4-36 international units/L at 37C or 4-36 units/L (SI units).  Reference ranges may be higher for older adults. AST  Newborn 44-40 days old: 35-140 units/L.  Child younger than 59 years old: 15-60 units/L.  21-20 years old: 15-50 units/L.  55-71 years old: 10-50 units/L.  7-70 years old: 10-40 units/L.  Adult: 0-35 units/L or 0-0.58 ?kat/L (SI units).  Reference ranges may be higher for older adults. ALP  Child younger than 94 years old: 85-235 units/L.  71-84 years old: 65-210 units/L.  104-48 years old: 60-300 units/L.  78-41 years old: 30-200 units/L.  Adult: 30-120 units/L or  0.5-2.0 ?kat/L (SI units).  Reference ranges may be higher for older adults. Total bilirubin  Newborn: 1.0-12.0 mg/dL or 41.2-878 ?mol/L (SI units).  Child or adult: 0.3-1.0 mg/dL or 6.7-67 ?mol/L. Albumin  Premature infant: 3.0-4.2 g/dL.  Newborn: 3.5-5.4 g/dL.  Infant: 4.4-5.4 g/dL.  Child: 4.0-5.9 g/dL.  Adult: 3.5-5.0 g/dL or 20-94 g/L (SI units). PT  11.0-12.5 seconds; 85%-100%. INR  0.8-1.1. Total protein  Premature  infant: 4.2-7.6 g/dL.  Newborn: 4.6-7.4 g/dL.  Infant: 6.0-6.7 g/dL.  Child: 6.2-8.0 g/dL.  Adult: 6.4-8.3 g/dL or 70-96 g/L (SI units). What do the results mean? Results that are within the reference ranges are considered normal. For each substance measured, results outside the reference range can indicate various health issues. ALT  Levels above the normal range may indicate liver disease. AST  Levels above the normal range may indicate liver disease. Sometimes levels also increase after burns, surgery, heart attack, muscle damage, or seizure. ALP  Levels above the normal range may be seen in biliary obstruction, liver diseases, bone disease, thyroid disease, tumors, fractures, leukemia, lymphoma, or several other conditions. People with blood type O or B may show higher levels after a fatty meal.  Levels below the normal range may indicate bone and teeth conditions, malnutrition, protein deficiency, or Wilson's disease. Total bilirubin  Levels above the normal range may indicate problems with the liver, gallbladder, or bile ducts. Albumin  Levels above the normal range may indicate dehydration. They may also be caused by a diet that is high in protein.  Levels below the normal range may indicate kidney disease, liver disease, or malabsorption of nutrients. PT and INR  Levels above the normal range mean that your blood is clotting slower than normal. This may be due to blood disorders, liver disorders, or low levels of vitamin K. Total protein  Levels above the normal range may be due to infection or other diseases.  Levels below the normal range may be due to an immune system disorder, bleeding, burns, kidney disorder, liver disease, trouble absorbing or getting nutrients, or other conditions that affect the intestines. Talk with your health care provider about what your results mean. Questions to ask your health care provider Ask your health care provider, or the  department that is doing the test:  When will my results be ready?  How will I get my results?  What are my treatment options?  What other tests do I need?  What are my next steps? Summary  Liver function tests are done to see how well your liver is working.  These tests measure various proteins and enzymes in your blood. The results can alert your health care provider to inflammation, damage, or disease in your liver.  Talk with your health care provider about what your results mean. This information is not intended to replace advice given to you by your health care provider. Make sure you discuss any questions you have with your health care provider. Document Released: 02/06/2004 Document Revised: 10/18/2016 Document Reviewed: 10/18/2016 Elsevier Interactive Patient Education  2019 [employer].

## 2018-03-12 LAB — HEPATITIS PANEL, ACUTE
HEP C AB: NONREACTIVE
Hep A IgM: NONREACTIVE
Hep B C IgM: NONREACTIVE
Hepatitis B Surface Ag: NONREACTIVE
SIGNAL TO CUT-OFF: 0.01 (ref <1.00)

## 2018-03-13 ENCOUNTER — Encounter: Payer: Self-pay | Admitting: Family Medicine

## 2018-03-16 ENCOUNTER — Ambulatory Visit
Admission: RE | Admit: 2018-03-16 | Discharge: 2018-03-16 | Disposition: A | Discharge status: Home / Self Care | Payer: 59 | Source: Ambulatory Visit | Attending: Family Medicine | Admitting: Family Medicine

## 2018-03-16 DIAGNOSIS — R7989 Other specified abnormal findings of blood chemistry: Secondary | ICD-10-CM | POA: Diagnosis not present

## 2018-03-29 ENCOUNTER — Encounter: Payer: Self-pay | Admitting: Family Medicine

## 2018-03-29 DIAGNOSIS — F988 Other specified behavioral and emotional disorders with onset usually occurring in childhood and adolescence: Secondary | ICD-10-CM

## 2018-03-29 MED ORDER — LISDEXAMFETAMINE DIMESYLATE 50 MG PO CAPS: 50.0000 mg | ORAL_CAPSULE | ORAL | 0 refills | Status: DC

## 2018-03-29 MED ORDER — LISDEXAMFETAMINE DIMESYLATE 50 MG PO CAPS: 50.0000 mg | ORAL_CAPSULE | Freq: Every day | ORAL | 0 refills | Status: DC

## 2018-06-11 ENCOUNTER — Other Ambulatory Visit: Payer: Self-pay | Admitting: Family Medicine

## 2018-06-11 DIAGNOSIS — F988 Other specified behavioral and emotional disorders with onset usually occurring in childhood and adolescence: Secondary | ICD-10-CM

## 2018-06-12 MED ORDER — LISDEXAMFETAMINE DIMESYLATE 50 MG PO CAPS: 50.0000 mg | ORAL_CAPSULE | ORAL | 0 refills | Status: DC

## 2018-06-12 NOTE — Telephone Encounter (Signed)
Requesting refill    Vyvanse  LOV: 03/09/18  LRF:  03/29/18

## 2018-07-12 ENCOUNTER — Other Ambulatory Visit: Payer: Self-pay | Admitting: Family Medicine

## 2018-07-12 DIAGNOSIS — F988 Other specified behavioral and emotional disorders with onset usually occurring in childhood and adolescence: Secondary | ICD-10-CM

## 2018-07-12 MED ORDER — LISDEXAMFETAMINE DIMESYLATE 50 MG PO CAPS: 50.0000 mg | ORAL_CAPSULE | ORAL | 0 refills | Status: DC

## 2018-07-12 NOTE — Telephone Encounter (Signed)
Ok to refill??  Last office visit 03/09/2018.  Last refill 06/12/2018.

## 2018-08-14 ENCOUNTER — Other Ambulatory Visit: Payer: Self-pay | Admitting: Family Medicine

## 2018-08-14 DIAGNOSIS — F988 Other specified behavioral and emotional disorders with onset usually occurring in childhood and adolescence: Secondary | ICD-10-CM

## 2018-08-14 MED ORDER — LISDEXAMFETAMINE DIMESYLATE 50 MG PO CAPS: 50.0000 mg | ORAL_CAPSULE | ORAL | 0 refills | Status: DC

## 2018-08-14 NOTE — Telephone Encounter (Signed)
Ok to refill??  Last office visit 02/22/2018.  Last refill 07/12/2018.

## 2018-09-18 ENCOUNTER — Other Ambulatory Visit: Payer: Self-pay | Admitting: Family Medicine

## 2018-09-18 DIAGNOSIS — F988 Other specified behavioral and emotional disorders with onset usually occurring in childhood and adolescence: Secondary | ICD-10-CM

## 2018-09-19 NOTE — Telephone Encounter (Signed)
Requesting refill   Vyvanse   LOV: 03/09/2018  LRF:  08/14/18

## 2018-09-20 MED ORDER — LISDEXAMFETAMINE DIMESYLATE 50 MG PO CAPS: 50.0000 mg | ORAL_CAPSULE | ORAL | 0 refills | Status: DC

## 2018-10-25 ENCOUNTER — Other Ambulatory Visit: Payer: Self-pay | Admitting: Family Medicine

## 2018-10-25 DIAGNOSIS — F988 Other specified behavioral and emotional disorders with onset usually occurring in childhood and adolescence: Secondary | ICD-10-CM

## 2018-10-25 MED ORDER — LISDEXAMFETAMINE DIMESYLATE 50 MG PO CAPS: 50.0000 mg | ORAL_CAPSULE | ORAL | 0 refills | Status: DC

## 2018-10-25 NOTE — Telephone Encounter (Signed)
Ok to refill??  Last office visit 03/09/2018.  Last refill 09/20/2018.  Of note, letter sent to patient to schedule appointment to establish with new provider.

## 2018-12-06 ENCOUNTER — Other Ambulatory Visit: Payer: Self-pay

## 2018-12-06 ENCOUNTER — Encounter: Payer: Self-pay | Admitting: Family Medicine

## 2018-12-06 ENCOUNTER — Ambulatory Visit (INDEPENDENT_AMBULATORY_CARE_PROVIDER_SITE_OTHER): Payer: 59 | Admitting: Family Medicine

## 2018-12-06 VITALS — BP 122/72 | HR 88 | Temp 97.4°F | Resp 16 | Ht 69.0 in | Wt 253.0 lb

## 2018-12-06 DIAGNOSIS — R748 Abnormal levels of other serum enzymes: Secondary | ICD-10-CM | POA: Diagnosis not present

## 2018-12-06 DIAGNOSIS — F17200 Nicotine dependence, unspecified, uncomplicated: Secondary | ICD-10-CM

## 2018-12-06 DIAGNOSIS — F988 Other specified behavioral and emotional disorders with onset usually occurring in childhood and adolescence: Secondary | ICD-10-CM

## 2018-12-06 DIAGNOSIS — E785 Hyperlipidemia, unspecified: Secondary | ICD-10-CM | POA: Diagnosis not present

## 2018-12-06 MED ORDER — KETOCONAZOLE 2 % EX SHAM: 1.0000 "application " | MEDICATED_SHAMPOO | CUTANEOUS | 5 refills | Status: DC

## 2018-12-06 MED ORDER — LISDEXAMFETAMINE DIMESYLATE 50 MG PO CAPS: 50.0000 mg | ORAL_CAPSULE | ORAL | 0 refills | Status: DC

## 2018-12-06 NOTE — Progress Notes (Signed)
Subjective:    Patient ID: Eric Hodge, male    DOB: 05/17/1985, 33 y.o.   MRN: [id number]  HPI  Patient is a very pleasant 33 year old Caucasian male here today for recheck to get a refill on his Vyvanse.  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  Without the medication he finds himself losing his train of thought rapidly.  He is unable to focus.  This makes it difficult for him to complete his assigned tasks in a timely fashion.  He often forgets assignments.  He finds himself constantly falling behind at work.  He has a difficult time maintaining his focus at home and at work.  With the medication this is much better.  He has done better on Vyvanse 70 in the past however the Vyvanse 50 is sufficient.  He denies any palpitations or chest pain or shortness of breath.  However the patient smokes.  His father had heart attack in his early 5250s.  Patient's lab work was significant for elevated liver function test along with a drastically elevated cholesterol in February of this year.  All of this increases his risk for cardiovascular disease.  I discussed this with the patient.  He believes the elevated liver function tests are likely due to the fact that he was drinking a large quantity of alcohol.  He is still drinking.  He also admits that he is not eating healthy.  He gets very little exercise and his BMI is elevated.  Past Medical History:  Diagnosis Date  . Allergy   . Retained orthopedic hardware 02/2017   right distal radius  . Runny nose 03/06/2017   clear drainage, per pt.   Past Surgical History:  Procedure Laterality Date  . CARPAL TUNNEL RELEASE Right 10/06/2016   Procedure: CARPAL TUNNEL RELEASE;  Surgeon: Betha LoaKuzma, Kevin, MD;  Location: [facility];  Service: Orthopedics;  Laterality: Right;  . FOREIGN BODY REMOVAL  09/08/2011   Procedure: REMOVAL FOREIGN BODY EXTREMITY;  Surgeon: Tami RibasKevin R Kuzma, MD;  Location: [facility];  Service: Orthopedics;  Laterality: Left;  FOREIGN BODY REMOVAL LEFT LONG FINGER  . HARDWARE REMOVAL Right 03/10/2017   Procedure: HARDWARE REMOVAL RIGHT WRIST;  Surgeon: Betha LoaKuzma, Kevin, MD;  Location: [facility];  Service: Orthopedics;  Laterality: Right;  . MASS EXCISION Left 10/29/2015   Procedure: LEFT WRIST EXCISION MASS;  Surgeon: Betha LoaKevin Kuzma, MD;  Location: [facility];  Service: Orthopedics;  Laterality: Left;  LEFT WRIST EXCISION MASS  . OPEN REDUCTION INTERNAL FIXATION (ORIF) DISTAL RADIAL FRACTURE Right 10/06/2016   Procedure: OPEN REDUCTION INTERNAL FIXATION (ORIF) RIGHT DISTAL RADIUS;  Surgeon: Betha LoaKuzma, Kevin, MD;  Location: [facility];  Service: Orthopedics;  Laterality: Right;  . WISDOM TOOTH EXTRACTION  2012   Current Outpatient Medications on File Prior to Visit  Medication Sig Dispense Refill  . azelastine (ASTELIN) 0.1 % nasal spray Place 2 sprays into both nostrils 2 (two) times daily. Use in each nostril as directed 30 mL 12  . Cetirizine HCl (ZYRTEC ALLERGY) 10 MG CAPS Take 1 capsule (10 mg total) by mouth daily as needed. 90 capsule 3  . diclofenac (VOLTAREN) 75 MG EC tablet Take 75 mg by mouth as needed. Knee pain    . lisdexamfetamine (VYVANSE) 50 MG capsule Take 1 capsule (50 mg total) by mouth every morning. 30 capsule 0  . omeprazole (PRILOSEC) 20 MG capsule Take 1 capsule (  20 mg total) by mouth daily. 30 capsule 11  . traMADol (ULTRAM) 50 MG tablet Take 50 mg by mouth every 6 (six) hours as needed. [facility]     No current facility-administered medications on file prior to visit.    No Known Allergies Social History   Socioeconomic History  . Marital status: Divorced    Spouse name: Not on file  . Number of children: Not on file  . Years of education: Not on file  . Highest education level: Not on file  Occupational History  . Not on file  Social Needs  . Financial resource strain: Not on file  . Food  insecurity    Worry: Not on file    Inability: Not on file  . Transportation needs    Medical: Not on file    Non-medical: Not on file  Tobacco Use  . Smoking status: Current Every Day Smoker    Packs/day: 0.50    Years: 10.00    Pack years: 5.00    Types: Cigarettes  . Smokeless tobacco: Never Used  Substance and Sexual Activity  . Alcohol use: Yes    Alcohol/week: 6.0 standard drinks    Types: 6 Shots of liquor per week    Comment: 4-5 days per week  . Drug use: No  . Sexual activity: Not Currently  Lifestyle  . Physical activity    Days per week: Not on file    Minutes per session: Not on file  . Stress: Not on file  Relationships  . Social Herbalist on phone: Not on file    Gets together: Not on file    Attends religious service: Not on file    Active member of club or organization: Not on file    Attends meetings of clubs or organizations: Not on file    Relationship status: Not on file  . Intimate partner violence    Fear of current or ex partner: Not on file    Emotionally abused: Not on file    Physically abused: Not on file    Forced sexual activity: Not on file  Other Topics Concern  . Not on file  Social History Narrative  . Not on file    Review of Systems     Objective:   Physical Exam Vitals signs reviewed.  Constitutional:      Appearance: Normal appearance. He is obese.  Cardiovascular:     Rate and Rhythm: Normal rate and regular rhythm.     Heart sounds: Normal heart sounds. No murmur. No friction rub. No gallop.   Pulmonary:     Effort: Pulmonary effort is normal. No respiratory distress.     Breath sounds: Normal breath sounds. No wheezing, rhonchi or rales.  Chest:     Chest wall: No tenderness.  Abdominal:     General: Bowel sounds are normal. There is no distension.     Palpations: Abdomen is soft.     Tenderness: There is no abdominal tenderness. There is no guarding or rebound.  Musculoskeletal:     Right lower leg:  No edema.     Left lower leg: No edema.  Neurological:     Mental Status: He is alert.           Assessment & Plan:  Elevated liver enzymes - Plan: COMPLETE METABOLIC PANEL WITH GFR, Lipid Panel  Attention deficit disorder (ADD) without hyperactivity - Plan: lisdexamfetamine (VYVANSE) 50 MG capsule  Hyperlipidemia, unspecified hyperlipidemia  type - Plan: COMPLETE METABOLIC PANEL WITH GFR, Lipid Panel  Smoker  Recommended abstinence from alcohol.  Return fasting for CMP and a fasting lipid panel.  If his LDL cholesterol remains significantly elevated as it was in February I would recommend a statin given his tobacco abuse and his family history of cardiovascular disease.  Blood pressure today is well controlled.  Strongly recommended smoking cessation.  Meanwhile refill the patient's Vyvanse 50 mg a day.  Patient's flu shot is up-to-date.

## 2018-12-18 ENCOUNTER — Other Ambulatory Visit: Payer: Self-pay

## 2018-12-18 DIAGNOSIS — K219 Gastro-esophageal reflux disease without esophagitis: Secondary | ICD-10-CM

## 2018-12-18 MED ORDER — OMEPRAZOLE 20 MG PO CPDR: 20.0000 mg | DELAYED_RELEASE_CAPSULE | Freq: Every day | ORAL | 11 refills | Status: DC

## 2018-12-19 ENCOUNTER — Other Ambulatory Visit: Payer: 59

## 2018-12-19 ENCOUNTER — Other Ambulatory Visit: Payer: Self-pay

## 2018-12-20 LAB — COMPLETE METABOLIC PANEL WITH GFR
AG Ratio: 2.2 (calc) (ref 1.0–2.5)
ALT: 58 U/L — ABNORMAL HIGH (ref 9–46)
AST: 26 U/L (ref 10–40)
Albumin: 4.1 g/dL (ref 3.6–5.1)
Alkaline phosphatase (APISO): 87 U/L (ref 36–130)
BUN: 17 mg/dL (ref 7–25)
CO2: 23 mmol/L (ref 20–32)
Calcium: 8.7 mg/dL (ref 8.6–10.3)
Chloride: 110 mmol/L (ref 98–110)
Creat: 1.15 mg/dL (ref 0.60–1.35)
GFR, Est African American: 98 mL/min/{1.73_m2} (ref >=60)
GFR, Est Non African American: 84 mL/min/{1.73_m2} (ref >=60)
Globulin: 1.9 g/dL (calc) (ref 1.9–3.7)
Glucose, Bld: 105 mg/dL — ABNORMAL HIGH (ref 65–99)
Potassium: 4.5 mmol/L (ref 3.5–5.3)
Sodium: 142 mmol/L (ref 135–146)
Total Bilirubin: 0.3 mg/dL (ref 0.2–1.2)
Total Protein: 6 g/dL — ABNORMAL LOW (ref 6.1–8.1)

## 2018-12-20 LAB — LIPID PANEL
Cholesterol: 224 mg/dL — ABNORMAL HIGH (ref <200)
HDL: 38 mg/dL — ABNORMAL LOW (ref >=40)
LDL Cholesterol (Calc): 142 mg/dL (calc) — ABNORMAL HIGH
Non-HDL Cholesterol (Calc): 186 mg/dL (calc) — ABNORMAL HIGH (ref <130)
Total CHOL/HDL Ratio: 5.9 (calc) — ABNORMAL HIGH (ref <5.0)
Triglycerides: 284 mg/dL — ABNORMAL HIGH (ref <150)

## 2018-12-21 MED ORDER — ATORVASTATIN CALCIUM 20 MG PO TABS: 20.0000 mg | ORAL_TABLET | Freq: Every day | ORAL | 1 refills | Status: DC

## 2019-01-07 ENCOUNTER — Other Ambulatory Visit: Payer: Self-pay | Admitting: Family Medicine

## 2019-01-07 DIAGNOSIS — F988 Other specified behavioral and emotional disorders with onset usually occurring in childhood and adolescence: Secondary | ICD-10-CM

## 2019-01-07 MED ORDER — LISDEXAMFETAMINE DIMESYLATE 50 MG PO CAPS: 50.0000 mg | ORAL_CAPSULE | ORAL | 0 refills | Status: DC

## 2019-01-07 NOTE — Telephone Encounter (Signed)
Ok to refill??  Last office visit/ refill 12/06/2018.

## 2019-02-07 ENCOUNTER — Other Ambulatory Visit: Payer: Self-pay | Admitting: Family Medicine

## 2019-02-07 DIAGNOSIS — F988 Other specified behavioral and emotional disorders with onset usually occurring in childhood and adolescence: Secondary | ICD-10-CM

## 2019-02-07 MED ORDER — LISDEXAMFETAMINE DIMESYLATE 50 MG PO CAPS: 50.0000 mg | ORAL_CAPSULE | ORAL | 0 refills | Status: DC

## 2019-02-07 NOTE — Telephone Encounter (Signed)
Ok to refill??  Last office visit 12/06/2018.  Last refill 01/07/2019.

## 2019-03-08 ENCOUNTER — Other Ambulatory Visit: Payer: Self-pay

## 2019-03-08 DIAGNOSIS — J3081 Allergic rhinitis due to animal (cat) (dog) hair and dander: Secondary | ICD-10-CM

## 2019-03-08 MED ORDER — MONTELUKAST SODIUM 10 MG PO TABS: 10.0000 mg | ORAL_TABLET | Freq: Every day | ORAL | 2 refills | Status: DC

## 2019-03-12 ENCOUNTER — Other Ambulatory Visit: Payer: Self-pay | Admitting: Family Medicine

## 2019-03-12 DIAGNOSIS — F988 Other specified behavioral and emotional disorders with onset usually occurring in childhood and adolescence: Secondary | ICD-10-CM

## 2019-03-12 MED ORDER — LISDEXAMFETAMINE DIMESYLATE 50 MG PO CAPS: 50.0000 mg | ORAL_CAPSULE | ORAL | 0 refills | Status: DC

## 2019-03-12 NOTE — Telephone Encounter (Signed)
Ok to refill??  Last office visit 12/06/2018.  Last refill 02/07/2019.

## 2019-03-20 IMAGING — US US ABDOMEN LIMITED
1 series · 14 of 25 positions shown · non-contrast
Comparison: None.

CLINICAL DATA: Elevated liver function tests.

EXAM:
ULTRASOUND ABDOMEN LIMITED RIGHT UPPER QUADRANT

[Series 1: us abdomen limited · 0.22mm/px · 14 of 40 slices shown]
[im 1/40]
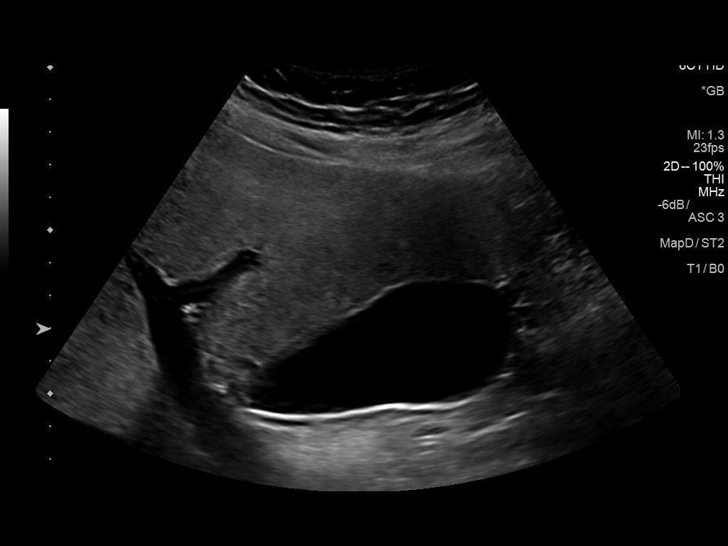
[im 4/40]
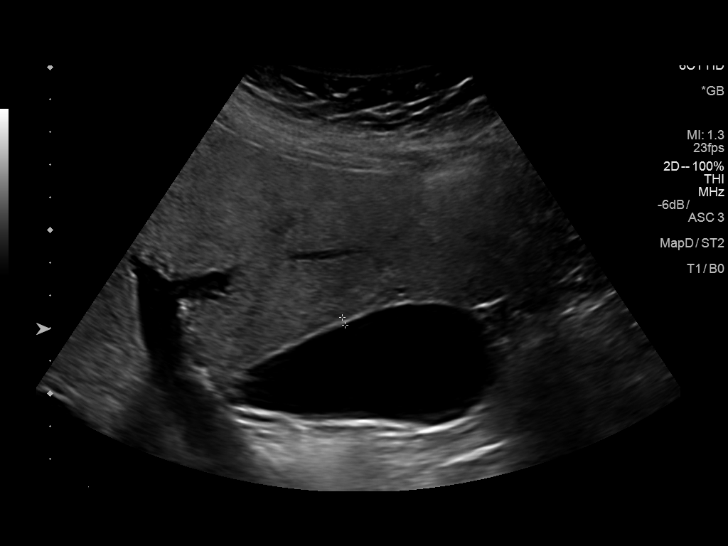
[im 7/40]
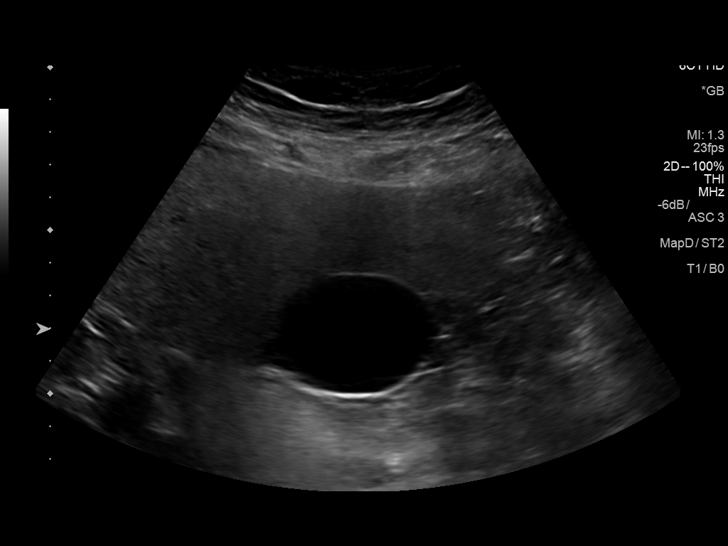
[im 10/40]
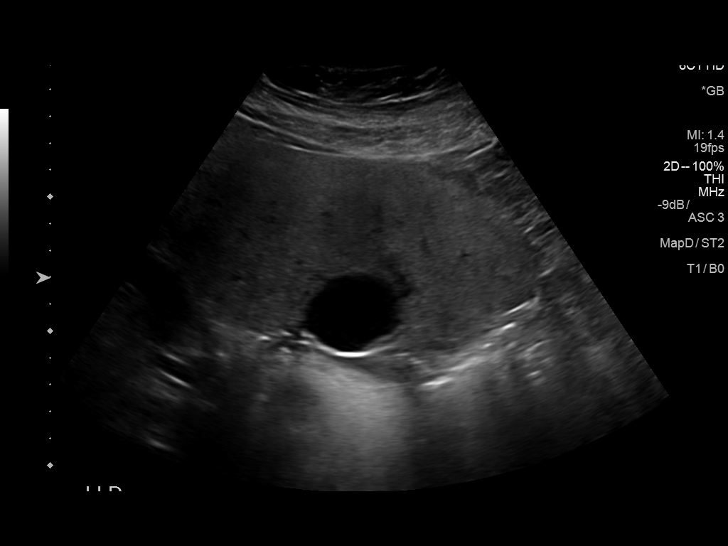
[im 14/40]
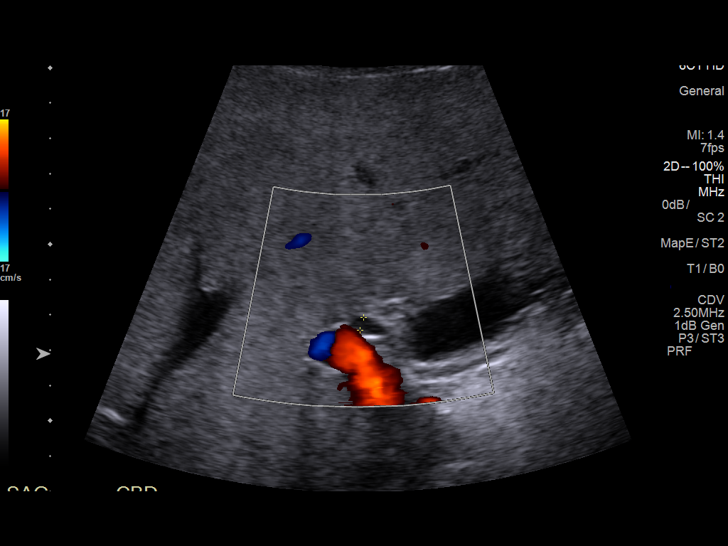
[im 15/40]
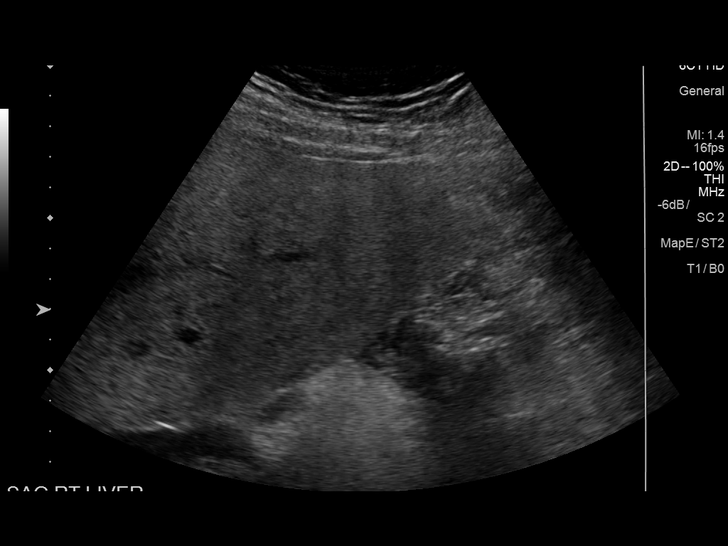
[im 18/40]
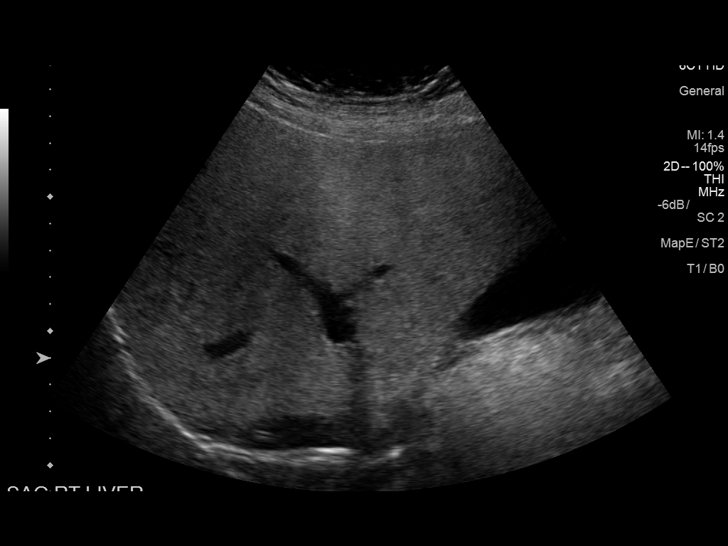
[im 22/40]
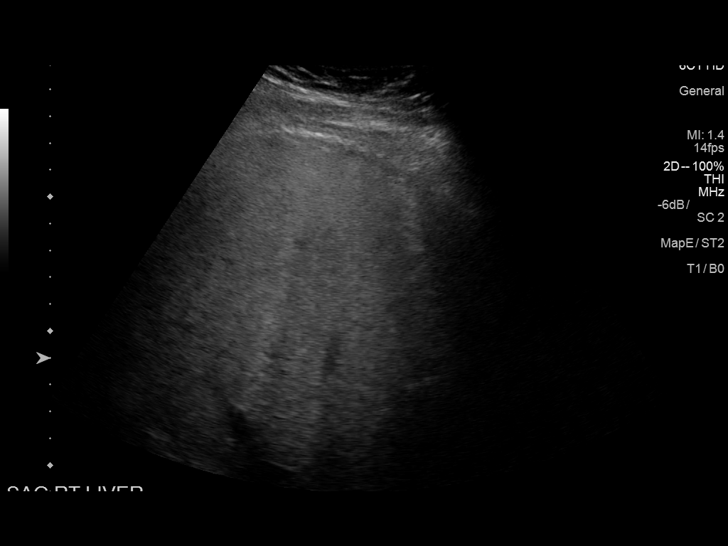
[im 25/40]
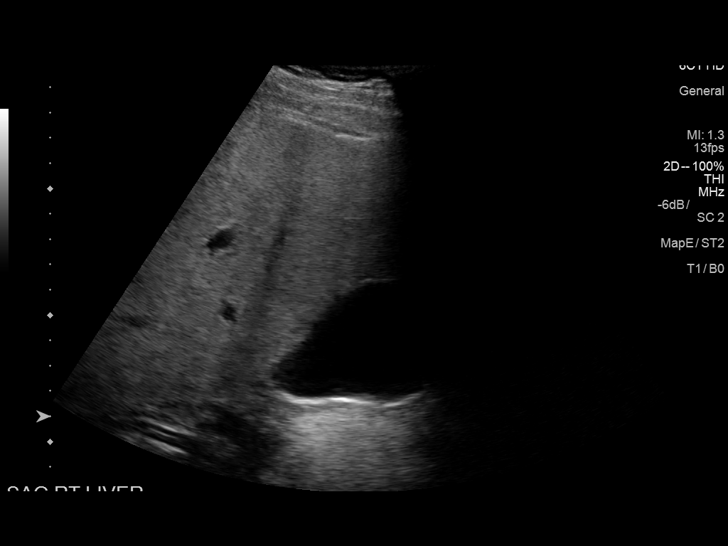
[im 27/40]
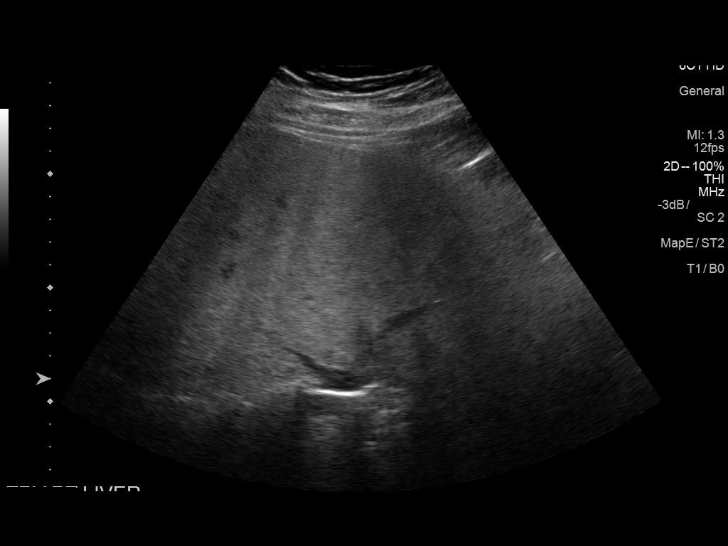
[im 30/40]
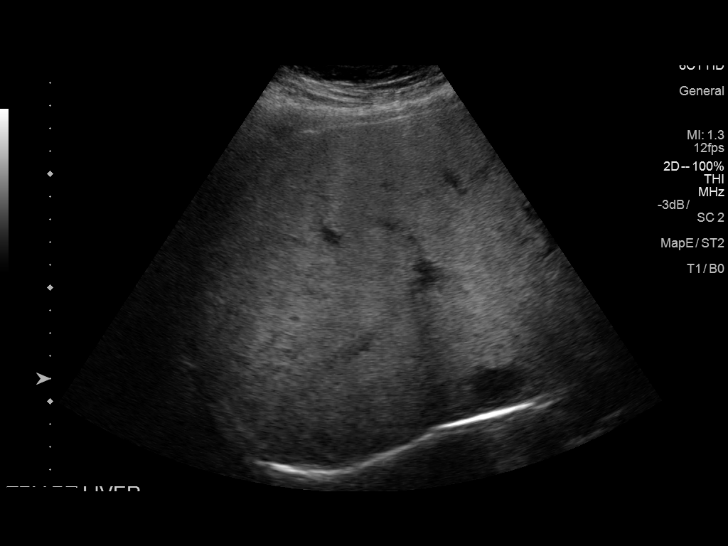
[im 33/40]
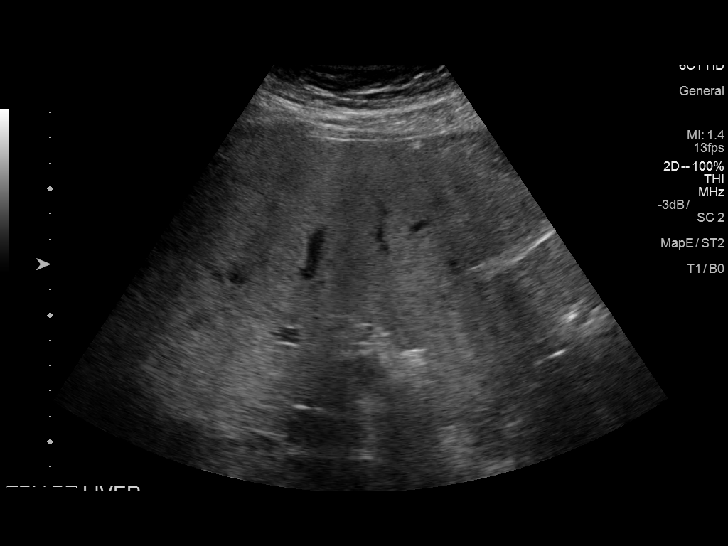
[im 36/40]
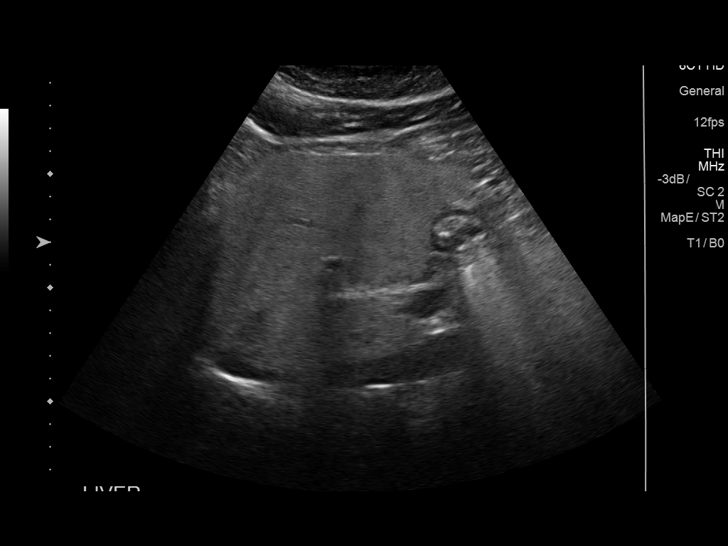
[im 40/40]
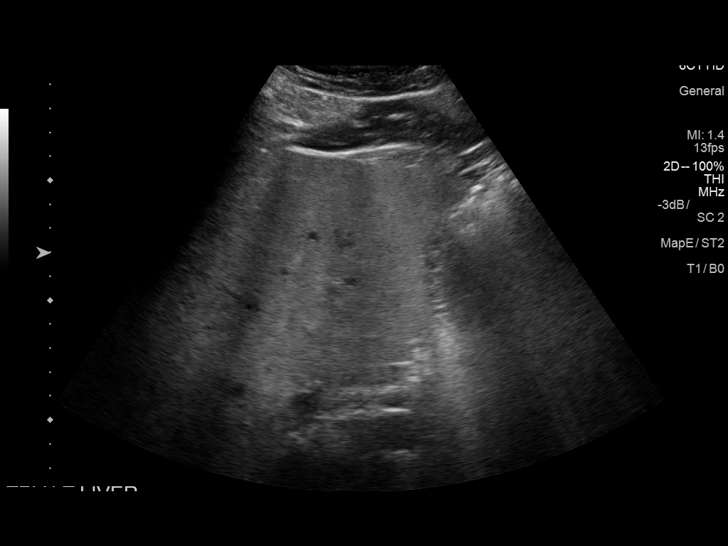

[14 of 25 positions shown; findings below may reference images not displayed]

FINDINGS: Gallbladder:

No gallstones or wall thickening visualized. No sonographic Murphy
sign noted by sonographer.

Common bile duct:

Diameter: 0.4 cm

Liver:

No focal lesion identified. Mildly hyperechoic liver with mildly
accentuated heteroechogenicity. No focal liver lesion identified.
Portal vein is patent on color Doppler imaging with normal direction
of blood flow towards the liver.
IMPRESSION: 1. Mildly hyperechoic liver also with some heterogenicity. In light
of the negative hepatitis panel lab result from 03/09/2018, I favor
the appearance is being due to diffuse hepatic steatosis.

## 2019-04-09 ENCOUNTER — Other Ambulatory Visit: Payer: Self-pay | Admitting: Family Medicine

## 2019-04-09 DIAGNOSIS — F988 Other specified behavioral and emotional disorders with onset usually occurring in childhood and adolescence: Secondary | ICD-10-CM

## 2019-04-09 MED ORDER — LISDEXAMFETAMINE DIMESYLATE 50 MG PO CAPS: 50.0000 mg | ORAL_CAPSULE | ORAL | 0 refills | Status: DC

## 2019-04-09 NOTE — Telephone Encounter (Signed)
Ok to refill??  Last office visit 12/06/2018.  Last refill 03/12/2019.

## 2019-05-10 ENCOUNTER — Other Ambulatory Visit: Payer: Self-pay | Admitting: Family Medicine

## 2019-05-10 DIAGNOSIS — F988 Other specified behavioral and emotional disorders with onset usually occurring in childhood and adolescence: Secondary | ICD-10-CM

## 2019-05-10 MED ORDER — LISDEXAMFETAMINE DIMESYLATE 50 MG PO CAPS: 50.0000 mg | ORAL_CAPSULE | ORAL | 0 refills | Status: DC

## 2019-05-10 NOTE — Telephone Encounter (Signed)
Ok to refill??  Last office visit 12/06/2018.  Last refill 04/09/2019.

## 2019-06-10 ENCOUNTER — Other Ambulatory Visit: Payer: Self-pay | Admitting: Family Medicine

## 2019-06-10 DIAGNOSIS — F988 Other specified behavioral and emotional disorders with onset usually occurring in childhood and adolescence: Secondary | ICD-10-CM

## 2019-06-10 MED ORDER — LISDEXAMFETAMINE DIMESYLATE 50 MG PO CAPS: 50.0000 mg | ORAL_CAPSULE | ORAL | 0 refills | Status: DC

## 2019-06-10 NOTE — Telephone Encounter (Signed)
Requested Prescriptions   Pending Prescriptions Disp Refills  . lisdexamfetamine (VYVANSE) 50 MG capsule 30 capsule 0    Sig: Take 1 capsule (50 mg total) by mouth every morning.     Last OV 12/06/2018   Last written 05/10/2019

## 2019-06-18 ENCOUNTER — Other Ambulatory Visit: Payer: Self-pay | Admitting: Family Medicine

## 2019-06-18 DIAGNOSIS — J3081 Allergic rhinitis due to animal (cat) (dog) hair and dander: Secondary | ICD-10-CM

## 2019-07-15 ENCOUNTER — Other Ambulatory Visit: Payer: Self-pay | Admitting: Family Medicine

## 2019-07-15 DIAGNOSIS — F988 Other specified behavioral and emotional disorders with onset usually occurring in childhood and adolescence: Secondary | ICD-10-CM

## 2019-07-15 MED ORDER — LISDEXAMFETAMINE DIMESYLATE 50 MG PO CAPS: 50.0000 mg | ORAL_CAPSULE | ORAL | 0 refills | Status: DC

## 2019-07-15 NOTE — Telephone Encounter (Signed)
Overdue for OV

## 2019-08-15 ENCOUNTER — Other Ambulatory Visit: Payer: Self-pay | Admitting: Family Medicine

## 2019-08-15 DIAGNOSIS — F988 Other specified behavioral and emotional disorders with onset usually occurring in childhood and adolescence: Secondary | ICD-10-CM

## 2019-08-15 MED ORDER — LISDEXAMFETAMINE DIMESYLATE 50 MG PO CAPS: 50.0000 mg | ORAL_CAPSULE | ORAL | 0 refills | Status: DC

## 2019-08-15 NOTE — Telephone Encounter (Signed)
Ok to refill??  Last office visit 12/21/2018.  Last refill 07/15/2019.

## 2019-09-16 ENCOUNTER — Other Ambulatory Visit: Payer: Self-pay | Admitting: Family Medicine

## 2019-09-16 DIAGNOSIS — F988 Other specified behavioral and emotional disorders with onset usually occurring in childhood and adolescence: Secondary | ICD-10-CM

## 2019-09-16 MED ORDER — LISDEXAMFETAMINE DIMESYLATE 50 MG PO CAPS: 50.0000 mg | ORAL_CAPSULE | ORAL | 0 refills | Status: DC

## 2019-09-16 NOTE — Telephone Encounter (Signed)
Ok to refill??  Last office visit 12/06/2018.  Last refill 08/15/2019.

## 2019-09-19 ENCOUNTER — Other Ambulatory Visit: Payer: Self-pay | Admitting: *Deleted

## 2019-09-19 MED ORDER — ATORVASTATIN CALCIUM 20 MG PO TABS: 20.0000 mg | ORAL_TABLET | Freq: Every day | ORAL | 1 refills | Status: DC

## 2019-09-26 ENCOUNTER — Other Ambulatory Visit: Payer: 59

## 2019-10-21 ENCOUNTER — Other Ambulatory Visit: Payer: Self-pay | Admitting: Family Medicine

## 2019-10-21 DIAGNOSIS — F988 Other specified behavioral and emotional disorders with onset usually occurring in childhood and adolescence: Secondary | ICD-10-CM

## 2019-10-28 ENCOUNTER — Encounter: Payer: Self-pay | Admitting: Family Medicine

## 2019-10-28 ENCOUNTER — Other Ambulatory Visit: Payer: Self-pay

## 2019-10-28 ENCOUNTER — Ambulatory Visit (INDEPENDENT_AMBULATORY_CARE_PROVIDER_SITE_OTHER): Payer: 59 | Admitting: Family Medicine

## 2019-10-28 VITALS — BP 140/88 | HR 98 | Temp 97.7°F | Ht 69.0 in | Wt 259.0 lb

## 2019-10-28 DIAGNOSIS — F988 Other specified behavioral and emotional disorders with onset usually occurring in childhood and adolescence: Secondary | ICD-10-CM

## 2019-10-28 DIAGNOSIS — E785 Hyperlipidemia, unspecified: Secondary | ICD-10-CM

## 2019-10-28 DIAGNOSIS — F909 Attention-deficit hyperactivity disorder, unspecified type: Secondary | ICD-10-CM | POA: Diagnosis not present

## 2019-10-28 DIAGNOSIS — F17200 Nicotine dependence, unspecified, uncomplicated: Secondary | ICD-10-CM

## 2019-10-28 DIAGNOSIS — R748 Abnormal levels of other serum enzymes: Secondary | ICD-10-CM

## 2019-10-28 MED ORDER — KETOCONAZOLE 2 % EX SHAM
1.0000 | MEDICATED_SHAMPOO | CUTANEOUS | 5 refills | Status: DC
Start: 2019-10-28 — End: 2021-09-25

## 2019-10-28 MED ORDER — LISDEXAMFETAMINE DIMESYLATE 50 MG PO CAPS: 50.0000 mg | ORAL_CAPSULE | ORAL | 0 refills | Status: DC

## 2019-10-28 MED ORDER — TRIAMCINOLONE ACETONIDE 0.1 % EX CREA: 1.0000 "application " | TOPICAL_CREAM | Freq: Two times a day (BID) | CUTANEOUS | 0 refills | Status: DC

## 2019-10-28 NOTE — Progress Notes (Signed)
Subjective:    Patient ID: Eric Hodge, male    DOB: [birth year], 34 y.o.   MRN: [id number]  HPI  12/06/18 Patient is a very pleasant 34 year old Caucasian male here today for recheck to get a refill on his Vyvanse.  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  Without the medication he finds himself losing his train of thought rapidly.  He is unable to focus.  This makes it difficult for him to complete his assigned tasks in a timely fashion.  He often forgets assignments.  He finds himself constantly falling behind at work.  He has a difficult time maintaining his focus at home and at work.  With the medication this is much better.  He has done better on Vyvanse 70 in the past however the Vyvanse 50 is sufficient.  He denies any palpitations or chest pain or shortness of breath.  However the patient smokes.  His father had heart attack in his early 21s.  Patient's lab work was significant for elevated liver function test along with a drastically elevated cholesterol in February of this year.  All of this increases his risk for cardiovascular disease.  I discussed this with the patient.  He believes the elevated liver function tests are likely due to the fact that he was drinking a large quantity of alcohol.  He is still drinking.  He also admits that he is not eating healthy.  He gets very little exercise and his BMI is elevated.  At that time, my plan was: Recommended abstinence from alcohol.  Return fasting for CMP and a fasting lipid panel.  If his LDL cholesterol remains significantly elevated as it was in February I would recommend a statin given his tobacco abuse and his family history of cardiovascular disease.  Blood pressure today is well controlled.  Strongly recommended smoking cessation.  Meanwhile refill the patient's Vyvanse 50 mg a day.  Patient's flu shot is up-to-date.  10/28/19` Please see labs from December 2020.  At that time my recommendations  were as follows: Liver test are much better. Recommend abstinence from alcohol. However cholesterol remains very high and given his family history of cardiovascular disease as well as his smoking I would recommend starting Lipitor 20 mg a day and recheck CMP and fasting lipid panel in 3 months  Patient has yet to follow-up until today.  However he has been taking his Lipitor as prescribed.  He denies any myalgias or right upper quadrant pain.  Unfortunately continues to drink.  He states that he lost his ex-wife this year and a friend to suicide.  1 was accidental  overdose and one was intentional.  He is still trying to raise his son alone.  His son is 12 years old and in elementary school.  As result he has been smoking and drinking more.  Family history significant for a father who had a heart attack in his 67s.  Patient has yet to receive his Covid vaccine.  I strongly recommended this.  He denies any depression.  He denies any anxiety.  I question if the patient may be drinking and smoking to manage stress and anxiety.  He states that is a possibility.  I suggested that he may benefit from meeting with a psychologist however he politely declines.  He is not exercising regularly.  Past Medical History:  Diagnosis Date  . Allergy   . Retained orthopedic hardware 02/2017   right distal radius  .  Runny nose 03/06/2017   clear drainage, per pt.   Past Surgical History:  Procedure Laterality Date  . CARPAL TUNNEL RELEASE Right 10/06/2016   Procedure: CARPAL TUNNEL RELEASE;  Surgeon: Betha Loa, MD;  Location: [facility];  Service: Orthopedics;  Laterality: Right;  . FOREIGN BODY REMOVAL  09/08/2011   Procedure: REMOVAL FOREIGN BODY EXTREMITY;  Surgeon: Tami Ribas, MD;  Location: [facility];  Service: Orthopedics;  Laterality: Left;  FOREIGN BODY REMOVAL LEFT LONG FINGER  . HARDWARE REMOVAL Right 03/10/2017   Procedure: HARDWARE REMOVAL RIGHT WRIST;  Surgeon: Betha Loa, MD;  Location: [facility];  Service: Orthopedics;  Laterality: Right;  . MASS EXCISION Left 10/29/2015   Procedure: LEFT WRIST EXCISION MASS;  Surgeon: Betha Loa, MD;  Location: [facility];  Service: Orthopedics;  Laterality: Left;  LEFT WRIST EXCISION MASS  . OPEN REDUCTION INTERNAL FIXATION (ORIF) DISTAL RADIAL FRACTURE Right 10/06/2016   Procedure: OPEN REDUCTION INTERNAL FIXATION (ORIF) RIGHT DISTAL RADIUS;  Surgeon: Betha Loa, MD;  Location: [facility];  Service: Orthopedics;  Laterality: Right;  . WISDOM TOOTH EXTRACTION  2012   Current Outpatient Medications on File Prior to Visit  Medication Sig Dispense Refill  . atorvastatin (LIPITOR) 20 MG tablet TAKE 1 TABLET(20 MG) BY MOUTH AT BEDTIME 90 tablet 1  . atorvastatin (LIPITOR) 20 MG tablet Take 1 tablet (20 mg total) by mouth at bedtime. 90 tablet 1  . azelastine (ASTELIN) 0.1 % nasal spray Place 2 sprays into both nostrils 2 (two) times daily. Use in each nostril as directed 30 mL 12  . Cetirizine HCl (ZYRTEC ALLERGY) 10 MG CAPS Take 1 capsule (10 mg total) by mouth daily as needed. 90 capsule 3  . diclofenac (VOLTAREN) 75 MG EC tablet Take 75 mg by mouth as needed. Knee pain    . ketoconazole (NIZORAL) 2 % shampoo Apply 1 application topically 2 (two) times a week. 120 mL 5  . lisdexamfetamine (VYVANSE) 50 MG capsule Take 1 capsule (50 mg total) by mouth every morning. 30 capsule 0  . montelukast (SINGULAIR) 10 MG tablet TAKE 1 TABLET(10 MG) BY MOUTH DAILY AS NEEDED FOR ALLERGIES 30 tablet 2  . omeprazole (PRILOSEC) 20 MG capsule Take 1 capsule (20 mg total) by mouth daily. 30 capsule 11  . traMADol (ULTRAM) 50 MG tablet Take 50 mg by mouth every 6 (six) hours as needed. [facility] Prescribes     No current facility-administered medications on file prior to visit.   No Known Allergies Social History   Socioeconomic History  . Marital status: Divorced    Spouse name: Not on file  .  Number of children: Not on file  . Years of education: Not on file  . Highest education level: Not on file  Occupational History  . Not on file  Tobacco Use  . Smoking status: Current Every Day Smoker    Packs/day: 0.50    Years: 10.00    Pack years: 5.00    Types: Cigarettes  . Smokeless tobacco: Never Used  Vaping Use  . Vaping Use: Never used  Substance and Sexual Activity  . Alcohol use: Yes    Alcohol/week: 6.0 standard drinks    Types: 6 Shots of liquor per week    Comment: 4-5 days per week  . Drug use: No  . Sexual activity: Not Currently  Other Topics Concern  . Not on file  Social History Narrative  . Not  on file   Social Determinants of Health   Financial Resource Strain:   . Difficulty of Paying Living Expenses: Not on file  Food Insecurity:   . Worried About Programme researcher, broadcasting/film/video in the Last Year: Not on file  . Ran Out of Food in the Last Year: Not on file  Transportation Needs:   . Lack of Transportation (Medical): Not on file  . Lack of Transportation (Non-Medical): Not on file  Physical Activity:   . Days of Exercise per Week: Not on file  . Minutes of Exercise per Session: Not on file  Stress:   . Feeling of Stress : Not on file  Social Connections:   . Frequency of Communication with Friends and Family: Not on file  . Frequency of Social Gatherings with Friends and Family: Not on file  . Attends Religious Services: Not on file  . Active Member of Clubs or Organizations: Not on file  . Attends Banker Meetings: Not on file  . Marital Status: Not on file  Intimate Partner Violence:   . Fear of Current or Ex-Partner: Not on file  . Emotionally Abused: Not on file  . Physically Abused: Not on file  . Sexually Abused: Not on file    Review of Systems     Objective:   Physical Exam Vitals reviewed.  Constitutional:      Appearance: Normal appearance. He is obese.  Cardiovascular:     Rate and Rhythm: Normal rate and regular  rhythm.     Heart sounds: Normal heart sounds. No murmur heard.  No friction rub. No gallop.   Pulmonary:     Effort: Pulmonary effort is normal. No respiratory distress.     Breath sounds: Normal breath sounds. No wheezing, rhonchi or rales.  Chest:     Chest wall: No tenderness.  Abdominal:     General: Bowel sounds are normal. There is no distension.     Palpations: Abdomen is soft.     Tenderness: There is no abdominal tenderness. There is no guarding or rebound.  Musculoskeletal:     Right lower leg: No edema.     Left lower leg: No edema.  Neurological:     Mental Status: He is alert.           Assessment & Plan:  Elevated liver enzymes - Plan: CBC with Differential/Platelet, COMPLETE METABOLIC PANEL WITH GFR, Lipid panel  Attention deficit hyperactivity disorder (ADHD), unspecified ADHD type - Plan: CBC with Differential/Platelet, COMPLETE METABOLIC PANEL WITH GFR, Lipid panel  Hyperlipidemia, unspecified hyperlipidemia type - Plan: CBC with Differential/Platelet, COMPLETE METABOLIC PANEL WITH GFR, Lipid panel  Smoker - Plan: CBC with Differential/Platelet, COMPLETE METABOLIC PANEL WITH GFR, Lipid panel  Attention deficit disorder (ADD) without hyperactivity - Plan: lisdexamfetamine (VYVANSE) 50 MG capsule  I will continue the patient's Vyvanse at 50 mg a day.  I will check a CMP to monitor his liver function tests.  I will check a fasting lipid panel.  Ideally I like his LDL cholesterol to be less than 100.  We need to watch his liver test closely to make sure the Lipitor is not exacerbating this.  Blood pressure today is borderline and I encouraged the patient to check his blood pressure daily.  Ideally like to keep his blood pressure less than 140/90.  Strongly recommend alcohol and tobacco cessation.  Offered the patient counseling but he declined.  Strongly recommended the Covid vaccine.  Spent more than 30 minutes today  with the patient in discussion.

## 2019-10-31 ENCOUNTER — Other Ambulatory Visit: Payer: 59

## 2019-10-31 ENCOUNTER — Other Ambulatory Visit: Payer: Self-pay

## 2019-10-31 DIAGNOSIS — F17200 Nicotine dependence, unspecified, uncomplicated: Secondary | ICD-10-CM

## 2019-10-31 DIAGNOSIS — R748 Abnormal levels of other serum enzymes: Secondary | ICD-10-CM

## 2019-10-31 DIAGNOSIS — R7989 Other specified abnormal findings of blood chemistry: Secondary | ICD-10-CM

## 2019-10-31 DIAGNOSIS — R5383 Other fatigue: Secondary | ICD-10-CM

## 2019-10-31 DIAGNOSIS — R6882 Decreased libido: Secondary | ICD-10-CM

## 2019-10-31 DIAGNOSIS — E785 Hyperlipidemia, unspecified: Secondary | ICD-10-CM

## 2019-11-01 LAB — LIPID PANEL
Cholesterol: 218 mg/dL — ABNORMAL HIGH (ref <200)
HDL: 49 mg/dL (ref >=40)
LDL Cholesterol (Calc): 139 mg/dL (calc) — ABNORMAL HIGH
Non-HDL Cholesterol (Calc): 169 mg/dL (calc) — ABNORMAL HIGH (ref <130)
Total CHOL/HDL Ratio: 4.4 (calc) (ref <5.0)
Triglycerides: 164 mg/dL — ABNORMAL HIGH (ref <150)

## 2019-11-01 LAB — COMPLETE METABOLIC PANEL WITH GFR
AG Ratio: 2 (calc) (ref 1.0–2.5)
ALT: 188 U/L — ABNORMAL HIGH (ref 9–46)
AST: 84 U/L — ABNORMAL HIGH (ref 10–40)
Albumin: 4.7 g/dL (ref 3.6–5.1)
Alkaline phosphatase (APISO): 99 U/L (ref 36–130)
BUN: 14 mg/dL (ref 7–25)
CO2: 23 mmol/L (ref 20–32)
Calcium: 9.5 mg/dL (ref 8.6–10.3)
Chloride: 108 mmol/L (ref 98–110)
Creat: 1.04 mg/dL (ref 0.60–1.35)
GFR, Est African American: 110 mL/min/{1.73_m2} (ref >=60)
GFR, Est Non African American: 95 mL/min/{1.73_m2} (ref >=60)
Globulin: 2.3 g/dL (calc) (ref 1.9–3.7)
Glucose, Bld: 102 mg/dL — ABNORMAL HIGH (ref 65–99)
Potassium: 4.8 mmol/L (ref 3.5–5.3)
Sodium: 139 mmol/L (ref 135–146)
Total Bilirubin: 0.9 mg/dL (ref 0.2–1.2)
Total Protein: 7 g/dL (ref 6.1–8.1)

## 2019-11-01 LAB — CBC WITH DIFFERENTIAL/PLATELET
Absolute Monocytes: 640 cells/uL (ref 200–950)
Basophils Absolute: 70 cells/uL (ref 0–200)
Basophils Relative: 1.1 %
Eosinophils Absolute: 218 cells/uL (ref 15–500)
Eosinophils Relative: 3.4 %
HCT: 46.7 % (ref 38.5–50.0)
Hemoglobin: 16.5 g/dL (ref 13.2–17.1)
Lymphs Abs: 1184 cells/uL (ref 850–3900)
MCH: 33.7 pg — ABNORMAL HIGH (ref 27.0–33.0)
MCHC: 35.3 g/dL (ref 32.0–36.0)
MCV: 95.3 fL (ref 80.0–100.0)
MPV: 9.9 fL (ref 7.5–12.5)
Monocytes Relative: 10 %
Neutro Abs: 4288 cells/uL (ref 1500–7800)
Neutrophils Relative %: 67 %
Platelets: 190 10*3/uL (ref 140–400)
RBC: 4.9 10*6/uL (ref 4.20–5.80)
RDW: 13.5 % (ref 11.0–15.0)
Total Lymphocyte: 18.5 %
WBC: 6.4 10*3/uL (ref 3.8–10.8)

## 2019-11-11 ENCOUNTER — Ambulatory Visit: Payer: 59 | Attending: Internal Medicine

## 2019-11-11 DIAGNOSIS — Z23 Encounter for immunization: Secondary | ICD-10-CM

## 2019-11-11 NOTE — Progress Notes (Signed)
   Covid-19 Vaccination Clinic  Name:  Eric Hodge    MRN: [id number] DOB: [birth year]  11/11/2019  Mr. Eric Hodge was observed post Covid-19 immunization for 15 minutes without incident. He was provided with Vaccine Information Sheet and instruction to access the V-Safe system.   Mr. Eric Hodge was instructed to call 911 with any severe reactions post vaccine: Marland Kitchen Difficulty breathing  . Swelling of face and throat  . A fast heartbeat  . A bad rash all over body  . Dizziness and weakness   Immunizations Administered    Name Date Dose VIS Date Route   Moderna COVID-19 Vaccine 11/11/2019 12:01 PM 0.5 mL 12/2018 Intramuscular   Manufacturer: Moderna   Lot: [id number]   NDC: 94503-888-28

## 2019-11-25 ENCOUNTER — Other Ambulatory Visit: Payer: Self-pay | Admitting: Family Medicine

## 2019-11-25 DIAGNOSIS — F988 Other specified behavioral and emotional disorders with onset usually occurring in childhood and adolescence: Secondary | ICD-10-CM

## 2019-11-25 MED ORDER — LISDEXAMFETAMINE DIMESYLATE 50 MG PO CAPS: 50.0000 mg | ORAL_CAPSULE | ORAL | 0 refills | Status: DC

## 2019-11-25 NOTE — Telephone Encounter (Signed)
Ok to refill??  Last office visit/ refill 10/28/2019.

## 2019-12-09 ENCOUNTER — Ambulatory Visit: Payer: 59 | Attending: Internal Medicine

## 2019-12-09 DIAGNOSIS — Z23 Encounter for immunization: Secondary | ICD-10-CM

## 2019-12-09 NOTE — Progress Notes (Signed)
   Covid-19 Vaccination Clinic  Name:  Eric Hodge    MRN: [id number] DOB: Jun 23, 1985  12/09/2019  [name] was observed post Covid-19 immunization for 15 minutes without incident. He was provided with Vaccine Information Sheet and instruction to access the V-Safe system.   [name] was instructed to call 911 with any severe reactions post vaccine: Marland Kitchen Difficulty breathing  . Swelling of face and throat  . A fast heartbeat  . A bad rash all over body  . Dizziness and weakness   Immunizations Administered    Name Date Dose VIS Date Route   Moderna COVID-19 Vaccine 12/09/2019  2:54 PM 0.5 mL 11/06/2019 Intramuscular   Manufacturer: Gala Murdoch   Lot: [id number]   NDC: 09628-366-29

## 2019-12-24 ENCOUNTER — Other Ambulatory Visit: Payer: Self-pay | Admitting: Family Medicine

## 2019-12-24 DIAGNOSIS — K219 Gastro-esophageal reflux disease without esophagitis: Secondary | ICD-10-CM

## 2019-12-24 DIAGNOSIS — F988 Other specified behavioral and emotional disorders with onset usually occurring in childhood and adolescence: Secondary | ICD-10-CM

## 2019-12-24 MED ORDER — LISDEXAMFETAMINE DIMESYLATE 50 MG PO CAPS: 50.0000 mg | ORAL_CAPSULE | ORAL | 0 refills | Status: DC

## 2019-12-24 NOTE — Telephone Encounter (Signed)
Ok to refill??  Last office visit 10/28/2019.  Last refill 11/25/2019. 

## 2020-01-27 ENCOUNTER — Other Ambulatory Visit: Payer: Self-pay | Admitting: Family Medicine

## 2020-01-27 DIAGNOSIS — F988 Other specified behavioral and emotional disorders with onset usually occurring in childhood and adolescence: Secondary | ICD-10-CM

## 2020-01-27 MED ORDER — LISDEXAMFETAMINE DIMESYLATE 50 MG PO CAPS: 50.0000 mg | ORAL_CAPSULE | ORAL | 0 refills | Status: DC

## 2020-02-24 ENCOUNTER — Other Ambulatory Visit: Payer: Self-pay | Admitting: Family Medicine

## 2020-02-24 DIAGNOSIS — F988 Other specified behavioral and emotional disorders with onset usually occurring in childhood and adolescence: Secondary | ICD-10-CM

## 2020-02-25 MED ORDER — LISDEXAMFETAMINE DIMESYLATE 50 MG PO CAPS: 50.0000 mg | ORAL_CAPSULE | ORAL | 0 refills | Status: DC

## 2020-02-25 NOTE — Telephone Encounter (Signed)
Ok to refill??  Last office visit 10/28/2019.  Last refill 01/27/2020.

## 2020-03-06 ENCOUNTER — Encounter: Payer: Self-pay | Admitting: Family Medicine

## 2020-03-26 ENCOUNTER — Other Ambulatory Visit: Payer: Self-pay | Admitting: Family Medicine

## 2020-03-26 DIAGNOSIS — F988 Other specified behavioral and emotional disorders with onset usually occurring in childhood and adolescence: Secondary | ICD-10-CM

## 2020-03-26 MED ORDER — LISDEXAMFETAMINE DIMESYLATE 50 MG PO CAPS: 50.0000 mg | ORAL_CAPSULE | ORAL | 0 refills | Status: DC

## 2020-04-30 ENCOUNTER — Other Ambulatory Visit: Payer: Self-pay | Admitting: Family Medicine

## 2020-04-30 DIAGNOSIS — F988 Other specified behavioral and emotional disorders with onset usually occurring in childhood and adolescence: Secondary | ICD-10-CM

## 2020-04-30 MED ORDER — LISDEXAMFETAMINE DIMESYLATE 50 MG PO CAPS: 50.0000 mg | ORAL_CAPSULE | ORAL | 0 refills | Status: DC

## 2020-05-20 ENCOUNTER — Other Ambulatory Visit: Payer: Self-pay

## 2020-05-20 ENCOUNTER — Ambulatory Visit (INDEPENDENT_AMBULATORY_CARE_PROVIDER_SITE_OTHER): Payer: 59

## 2020-05-20 ENCOUNTER — Ambulatory Visit (INDEPENDENT_AMBULATORY_CARE_PROVIDER_SITE_OTHER): Payer: 59 | Admitting: Podiatry

## 2020-05-20 ENCOUNTER — Encounter: Payer: Self-pay | Admitting: Podiatry

## 2020-05-20 DIAGNOSIS — M7671 Peroneal tendinitis, right leg: Secondary | ICD-10-CM

## 2020-05-20 DIAGNOSIS — M79671 Pain in right foot: Secondary | ICD-10-CM | POA: Diagnosis not present

## 2020-05-20 MED ORDER — TRIAMCINOLONE ACETONIDE 10 MG/ML IJ SUSP
10.0000 mg | Freq: Once | INTRAMUSCULAR | Status: AC
  Administered 2020-05-20: 10 mg

## 2020-05-20 NOTE — Progress Notes (Signed)
Subjective:   Patient ID: Eric Hodge, male   DOB: 35 y.o.   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  Patient does smoke half a pack per day and likes to be active   Review of Systems  All other systems reviewed and are negative.       Objective:  Physical Exam Vitals and nursing note reviewed.  Constitutional:      Appearance: He is well-developed.  Pulmonary:     Effort: Pulmonary effort is normal.  Musculoskeletal:        General: Normal range of motion.  Skin:    General: Skin is warm.  Neurological:     Mental Status: He is alert.     Neurovascular status intact muscle strength found to be adequate range of motion adequate with discomfort of the lateral side of the right foot that is painful when pressed and hard to walk with.  It is localized to the lateral side of the foot     Assessment:  Inflammatory tendinitis of the peroneal groove the right secondary to twisting injury with no apparent muscle or bone structural damage     Plan:  H&P reviewed condition sterile prep and injected the tendon complex right 3 mg dexamethasone Kenalog 5 mg Xylocaine advised on ice therapy support and reappoint to recheck  X-rays indicate no signs of fracture no signs of bony injury associated with condition

## 2020-06-04 ENCOUNTER — Other Ambulatory Visit: Payer: Self-pay | Admitting: Family Medicine

## 2020-06-04 DIAGNOSIS — F988 Other specified behavioral and emotional disorders with onset usually occurring in childhood and adolescence: Secondary | ICD-10-CM

## 2020-06-04 MED ORDER — LISDEXAMFETAMINE DIMESYLATE 50 MG PO CAPS: 50.0000 mg | ORAL_CAPSULE | ORAL | 0 refills | Status: DC

## 2020-07-21 ENCOUNTER — Other Ambulatory Visit: Payer: Self-pay | Admitting: Family Medicine

## 2020-07-21 DIAGNOSIS — J3081 Allergic rhinitis due to animal (cat) (dog) hair and dander: Secondary | ICD-10-CM

## 2020-07-21 DIAGNOSIS — F988 Other specified behavioral and emotional disorders with onset usually occurring in childhood and adolescence: Secondary | ICD-10-CM

## 2020-07-21 MED ORDER — LISDEXAMFETAMINE DIMESYLATE 50 MG PO CAPS
50.0000 mg | ORAL_CAPSULE | ORAL | 0 refills | Status: DC
Start: 2020-07-21 — End: 2020-08-24

## 2020-08-24 ENCOUNTER — Other Ambulatory Visit: Payer: Self-pay | Admitting: Family Medicine

## 2020-08-24 DIAGNOSIS — F988 Other specified behavioral and emotional disorders with onset usually occurring in childhood and adolescence: Secondary | ICD-10-CM

## 2020-08-24 NOTE — Telephone Encounter (Signed)
Ok to refill??  Last office visit 10/28/2019.  Last refill 07/21/2020.

## 2020-08-25 MED ORDER — LISDEXAMFETAMINE DIMESYLATE 50 MG PO CAPS
50.0000 mg | ORAL_CAPSULE | ORAL | 0 refills | Status: DC
Start: 2020-08-25 — End: 2020-09-28

## 2020-09-28 ENCOUNTER — Other Ambulatory Visit: Payer: Self-pay | Admitting: Family Medicine

## 2020-09-28 DIAGNOSIS — F988 Other specified behavioral and emotional disorders with onset usually occurring in childhood and adolescence: Secondary | ICD-10-CM

## 2020-09-28 MED ORDER — LISDEXAMFETAMINE DIMESYLATE 50 MG PO CAPS: 50.0000 mg | ORAL_CAPSULE | ORAL | 0 refills | Status: DC

## 2020-09-28 NOTE — Telephone Encounter (Signed)
Ok to refill??  Last office visit 10/28/2019.  Last refill 08/25/2020.

## 2020-11-05 ENCOUNTER — Other Ambulatory Visit: Payer: Self-pay | Admitting: Family Medicine

## 2020-11-05 DIAGNOSIS — J3081 Allergic rhinitis due to animal (cat) (dog) hair and dander: Secondary | ICD-10-CM

## 2020-11-11 ENCOUNTER — Other Ambulatory Visit: Payer: Self-pay | Admitting: Family Medicine

## 2020-11-11 DIAGNOSIS — F988 Other specified behavioral and emotional disorders with onset usually occurring in childhood and adolescence: Secondary | ICD-10-CM

## 2020-11-11 NOTE — Telephone Encounter (Signed)
Ok to refill??  Last office visit 10/28/2019.  Last refill 09/28/2020.  Letter sent to patient to schedule OV.

## 2020-11-12 MED ORDER — LISDEXAMFETAMINE DIMESYLATE 50 MG PO CAPS: 50.0000 mg | ORAL_CAPSULE | ORAL | 0 refills | Status: DC

## 2021-09-17 DIAGNOSIS — F1090 Alcohol use, unspecified, uncomplicated: Secondary | ICD-10-CM

## 2021-09-17 DIAGNOSIS — K852 Alcohol induced acute pancreatitis without necrosis or infection: Secondary | ICD-10-CM

## 2021-09-17 HISTORY — DX: Alcohol induced acute pancreatitis without necrosis or infection: K85.20

## 2021-09-17 HISTORY — DX: Alcohol use, unspecified, uncomplicated: F10.90

## 2021-09-19 ENCOUNTER — Other Ambulatory Visit: Payer: Self-pay

## 2021-09-19 ENCOUNTER — Encounter (HOSPITAL_COMMUNITY): Payer: Self-pay | Admitting: Emergency Medicine

## 2021-09-19 ENCOUNTER — Inpatient Hospital Stay (HOSPITAL_COMMUNITY)
Admission: EM | Admit: 2021-09-19 | Discharge: 2021-09-25 | DRG: 439 | Disposition: A | Discharge status: Home / Self Care | Payer: 59 | Attending: Internal Medicine | Admitting: Internal Medicine

## 2021-09-19 ENCOUNTER — Emergency Department (HOSPITAL_COMMUNITY): Payer: 59

## 2021-09-19 DIAGNOSIS — K760 Fatty (change of) liver, not elsewhere classified: Secondary | ICD-10-CM | POA: Diagnosis present

## 2021-09-19 DIAGNOSIS — K852 Alcohol induced acute pancreatitis without necrosis or infection: Principal | ICD-10-CM | POA: Diagnosis present

## 2021-09-19 DIAGNOSIS — F17200 Nicotine dependence, unspecified, uncomplicated: Secondary | ICD-10-CM | POA: Diagnosis present

## 2021-09-19 DIAGNOSIS — Z83438 Family history of other disorder of lipoprotein metabolism and other lipidemia: Secondary | ICD-10-CM

## 2021-09-19 DIAGNOSIS — K863 Pseudocyst of pancreas: Secondary | ICD-10-CM | POA: Diagnosis present

## 2021-09-19 DIAGNOSIS — Z823 Family history of stroke: Secondary | ICD-10-CM | POA: Diagnosis not present

## 2021-09-19 DIAGNOSIS — Z8249 Family history of ischemic heart disease and other diseases of the circulatory system: Secondary | ICD-10-CM

## 2021-09-19 DIAGNOSIS — N179 Acute kidney failure, unspecified: Secondary | ICD-10-CM

## 2021-09-19 DIAGNOSIS — E66813 Obesity, class 3: Secondary | ICD-10-CM

## 2021-09-19 DIAGNOSIS — F1010 Alcohol abuse, uncomplicated: Secondary | ICD-10-CM | POA: Diagnosis present

## 2021-09-19 DIAGNOSIS — F909 Attention-deficit hyperactivity disorder, unspecified type: Secondary | ICD-10-CM | POA: Diagnosis present

## 2021-09-19 DIAGNOSIS — F17210 Nicotine dependence, cigarettes, uncomplicated: Secondary | ICD-10-CM | POA: Diagnosis present

## 2021-09-19 DIAGNOSIS — E876 Hypokalemia: Secondary | ICD-10-CM

## 2021-09-19 DIAGNOSIS — Z79899 Other long term (current) drug therapy: Secondary | ICD-10-CM

## 2021-09-19 DIAGNOSIS — Z801 Family history of malignant neoplasm of trachea, bronchus and lung: Secondary | ICD-10-CM | POA: Diagnosis not present

## 2021-09-19 DIAGNOSIS — E7800 Pure hypercholesterolemia, unspecified: Secondary | ICD-10-CM | POA: Diagnosis present

## 2021-09-19 DIAGNOSIS — Z6841 Body Mass Index (BMI) 40.0 and over, adult: Secondary | ICD-10-CM | POA: Diagnosis not present

## 2021-09-19 DIAGNOSIS — R7989 Other specified abnormal findings of blood chemistry: Secondary | ICD-10-CM | POA: Diagnosis not present

## 2021-09-19 LAB — CBC
HCT: 47 % (ref 39.0–52.0)
Hemoglobin: 17.4 g/dL — ABNORMAL HIGH (ref 13.0–17.0)
MCH: 34.5 pg — ABNORMAL HIGH (ref 26.0–34.0)
MCHC: 37 g/dL — ABNORMAL HIGH (ref 30.0–36.0)
MCV: 93.3 fL (ref 80.0–100.0)
Platelets: 203 10*3/uL (ref 150–400)
RBC: 5.04 MIL/uL (ref 4.22–5.81)
RDW: 12.3 % (ref 11.5–15.5)
WBC: 10.7 10*3/uL — ABNORMAL HIGH (ref 4.0–10.5)
nRBC: 0 % (ref 0.0–0.2)

## 2021-09-19 LAB — COMPREHENSIVE METABOLIC PANEL
ALT: 211 U/L — ABNORMAL HIGH (ref 0–44)
AST: 120 U/L — ABNORMAL HIGH (ref 15–41)
Albumin: 4.4 g/dL (ref 3.5–5.0)
Alkaline Phosphatase: 79 U/L (ref 38–126)
Anion gap: 14 (ref 5–15)
BUN: 10 mg/dL (ref 6–20)
CO2: 18 mmol/L — ABNORMAL LOW (ref 22–32)
Calcium: 9.9 mg/dL (ref 8.9–10.3)
Chloride: 109 mmol/L (ref 98–111)
Creatinine, Ser: 0.99 mg/dL (ref 0.61–1.24)
GFR, Estimated: >60 mL/min (ref >60)
Glucose, Bld: 155 mg/dL — ABNORMAL HIGH (ref 70–99)
Potassium: 3.9 mmol/L (ref 3.5–5.1)
Sodium: 141 mmol/L (ref 135–145)
Total Bilirubin: 0.9 mg/dL (ref 0.3–1.2)
Total Protein: 7.3 g/dL (ref 6.5–8.1)

## 2021-09-19 LAB — LIPASE, BLOOD: Lipase: 66 U/L — ABNORMAL HIGH (ref 11–51)

## 2021-09-19 MED ORDER — HYDROMORPHONE HCL 1 MG/ML IJ SOLN
1.0000 mg | Freq: Once | INTRAMUSCULAR | Status: AC
  Administered 2021-09-19: 1 mg via INTRAVENOUS
  Filled 2021-09-19: qty 1

## 2021-09-19 MED ORDER — SODIUM CHLORIDE 0.9 % IV BOLUS
1000.0000 mL | Freq: Once | INTRAVENOUS | Status: AC
  Administered 2021-09-19: 1000 mL via INTRAVENOUS

## 2021-09-19 MED ORDER — ONDANSETRON HCL 4 MG/2ML IJ SOLN
4.0000 mg | Freq: Once | INTRAMUSCULAR | Status: AC
  Administered 2021-09-19: 4 mg via INTRAVENOUS
  Filled 2021-09-19: qty 2

## 2021-09-19 MED ORDER — HYDROMORPHONE HCL 1 MG/ML IJ SOLN
2.0000 mg | INTRAMUSCULAR | Status: DC | PRN
  Administered 2021-09-19 – 2021-09-20 (×4): 2 mg via INTRAVENOUS
  Filled 2021-09-19 (×5): qty 2

## 2021-09-19 MED ORDER — LACTATED RINGERS IV SOLN: INTRAVENOUS | Status: DC

## 2021-09-19 MED ORDER — HYDROMORPHONE HCL 1 MG/ML IJ SOLN
1.0000 mg | INTRAMUSCULAR | Status: DC | PRN
  Administered 2021-09-20: 1 mg via INTRAVENOUS
  Filled 2021-09-19: qty 1

## 2021-09-19 MED ORDER — IOHEXOL 300 MG/ML  SOLN
100.0000 mL | Freq: Once | INTRAMUSCULAR | Status: AC | PRN
Start: 2021-09-19 — End: 2021-09-19
  Administered 2021-09-19: 100 mL via INTRAVENOUS

## 2021-09-19 MED ORDER — PANTOPRAZOLE SODIUM 40 MG IV SOLR
40.0000 mg | Freq: Two times a day (BID) | INTRAVENOUS | Status: DC
  Administered 2021-09-19 – 2021-09-21 (×4): 40 mg via INTRAVENOUS
  Filled 2021-09-19 (×4): qty 10

## 2021-09-19 NOTE — ED Triage Notes (Signed)
Patient c/o upper abdominal pain onset of this morning. Reports dry heaving earlier but no emesis. Denies any diarrhea or urinary symptoms.

## 2021-09-19 NOTE — Assessment & Plan Note (Signed)
Likely due to alcohol. But typical ASTx2 : ALT ratio not seen. Check hep C ab.

## 2021-09-19 NOTE — ED Provider Notes (Signed)
[name] EMERGENCY DEPARTMENT Provider Note   CSN: [id number] Arrival date & time: 09/19/21  1626     History  Chief Complaint  Patient presents with   Abdominal Pain    Eric Hodge is a 36 y.o. male.   Abdominal Pain  This patient is a 36 year old male, history of hypercholesterolemia not actively taking his medications.  He states that he does drink heavily and takes 4 or 5 liquor drinks per night, he last drank last night, when he woke up this morning he ate some chips and was promptly followed by having significant periumbilical and mid epigastric pain.  This does not radiate to the shoulders the chest or the back, it is persistent, he tried to drink some water but it got much worse, he has no nausea or vomiting, no diarrhea, no blood in the stool, no urinary symptoms including hematuria or dysuria.  No prior abdominal surgical history no history of pancreatitis or gall disease that he knows of.  Occasions given prior to arrival.    Home Medications Prior to Admission medications   Medication Sig Start Date End Date Taking? Authorizing Provider  atorvastatin (LIPITOR) 20 MG tablet TAKE 1 TABLET(20 MG) BY MOUTH AT BEDTIME Patient not taking: Reported on 10/28/2019 09/19/19   Donita Brooks, MD  atorvastatin (LIPITOR) 20 MG tablet Take 1 tablet (20 mg total) by mouth at bedtime. 09/19/19   Donita Brooks, MD  atorvastatin (LIPITOR) 20 MG tablet TAKE 1 TABLET BY MOUTH EVERY NIGHT AT BEDTIME. 09/28/20   Donita Brooks, MD  azelastine (ASTELIN) 0.1 % nasal spray Place 2 sprays into both nostrils 2 (two) times daily. Use in each nostril as directed 11/01/17   Dorena Bodo, PA-C  Cetirizine HCl (ZYRTEC ALLERGY) 10 MG CAPS Take 1 capsule (10 mg total) by mouth daily as needed. 11/01/17   Dorena Bodo, PA-C  ketoconazole (NIZORAL) 2 % shampoo Apply 1 application topically 2 (two) times a week. 10/28/19   Donita Brooks, MD  lisdexamfetamine (VYVANSE) 50 MG  capsule Take 1 capsule (50 mg total) by mouth every morning. 11/12/20   Donita Brooks, MD  montelukast (SINGULAIR) 10 MG tablet TAKE 1 TABLET(10 MG) BY MOUTH DAILY AS NEEDED FOR ALLERGIES 11/05/20   Donita Brooks, MD  omeprazole (PRILOSEC) 20 MG capsule TAKE 1 CAPSULE(20 MG) BY MOUTH DAILY 12/24/19   Donita Brooks, MD  triamcinolone cream (KENALOG) 0.1 % Apply 1 application topically 2 (two) times daily. 10/28/19   Donita Brooks, MD      Allergies    Patient has no known allergies.    Review of Systems   Review of Systems  Gastrointestinal:  Positive for abdominal pain.  All other systems reviewed and are negative.   Physical Exam Updated Vital Signs BP (!) 172/113 (BP Location: Right Arm)   Pulse (!) 107   Temp 98.2 F (36.8 C)   Resp (!) 22   Ht 1.753 m (5\' 9" )   Wt 127 kg   SpO2 99%   BMI 41.35 kg/m  Physical Exam Vitals and nursing note reviewed.  Constitutional:      General: He is not in acute distress.    Appearance: He is well-developed.  HENT:     Head: Normocephalic and atraumatic.     Mouth/Throat:     Pharynx: No oropharyngeal exudate.  Eyes:     General: No scleral icterus.       Right eye: No  discharge.        Left eye: No discharge.     Conjunctiva/sclera: Conjunctivae normal.     Pupils: Pupils are equal, round, and reactive to light.  Neck:     Thyroid: No thyromegaly.     Vascular: No JVD.  Cardiovascular:     Rate and Rhythm: Regular rhythm. Tachycardia present.     Heart sounds: Normal heart sounds. No murmur heard.    No friction rub. No gallop.     Comments: Heart rate around 100 to 105 bpm, good pulses Pulmonary:     Effort: Pulmonary effort is normal. No respiratory distress.     Breath sounds: Normal breath sounds. No wheezing or rales.  Abdominal:     General: Bowel sounds are normal. There is no distension.     Palpations: Abdomen is soft. There is no mass.     Tenderness: There is abdominal tenderness.     Comments:  Mild mid abdominal tenderness but no guarding, no Murphy sign, no pain McBurney's point, nonperitoneal  Musculoskeletal:        General: No tenderness. Normal range of motion.     Cervical back: Normal range of motion and neck supple.     Right lower leg: No edema.     Left lower leg: No edema.  Lymphadenopathy:     Cervical: No cervical adenopathy.  Skin:    General: Skin is warm and dry.     Findings: No erythema or rash.  Neurological:     Mental Status: He is alert.     Coordination: Coordination normal.  Psychiatric:        Behavior: Behavior normal.     ED Results / Procedures / Treatments   Labs (all labs ordered are listed, but only abnormal results are displayed) Labs Reviewed  LIPASE, BLOOD - Abnormal; Notable for the following components:      Result Value   Lipase 66 (*)    All other components within normal limits  COMPREHENSIVE METABOLIC PANEL - Abnormal; Notable for the following components:   CO2 18 (*)    Glucose, Bld 155 (*)    AST 120 (*)    ALT 211 (*)    All other components within normal limits  CBC - Abnormal; Notable for the following components:   WBC 10.7 (*)    Hemoglobin 17.4 (*)    MCH 34.5 (*)    MCHC 37.0 (*)    All other components within normal limits  URINALYSIS, ROUTINE W REFLEX MICROSCOPIC    EKG None  Radiology CT ABDOMEN PELVIS W CONTRAST  Result Date: 09/19/2021 CLINICAL DATA:  Abdominal pain, acute, nonlocalized EXAM: CT ABDOMEN AND PELVIS WITH CONTRAST TECHNIQUE: Multidetector CT imaging of the abdomen and pelvis was performed using the standard protocol following bolus administration of intravenous contrast. RADIATION DOSE REDUCTION: This exam was performed according to the departmental dose-optimization program which includes automated exposure control, adjustment of the mA and/or kV according to patient size and/or use of iterative reconstruction technique. CONTRAST:  OMNIPAQUE IOHEXOL 300 MG/ML  SOLN COMPARISON:  None  Available. FINDINGS: Lower chest: No acute abnormality. Hepatobiliary: Marked hepatic steatosis. No enhancing intrahepatic mass. No intra or extrahepatic biliary ductal dilation. Gallbladder unremarkable. Pancreas: The pancreatic parenchyma demonstrates diffuse fatty atrophy. There is superimposed diffuse peripancreatic inflammatory stranding in keeping with superimposed diffuse, acute pancreatitis. Viability of the pancreatic parenchyma is difficult to assess given background fatty atrophy with areas of hypoattenuation representing either fatty infiltration or areas  of hypoenhancement representing threatened viability of the pancreatic parenchyma. No peripancreatic fluid collections are identified. Spleen: Mild splenomegaly with the spleen measuring 15.1 cm in greatest dimension. No intrasplenic lesions are seen. Splenic vein is patent. Adrenals/Urinary Tract: Adrenal glands are unremarkable. Kidneys are normal, without renal calculi, focal lesion, or hydronephrosis. Bladder is unremarkable. Stomach/Bowel: Stomach is within normal limits. Appendix appears normal. No evidence of bowel wall thickening, distention, or inflammatory changes. Vascular/Lymphatic: No significant vascular findings are present. No enlarged abdominal or pelvic lymph nodes. Reproductive: Prostate is unremarkable. Other: No abdominal wall hernia or abnormality. No abdominopelvic ascites. Musculoskeletal: No acute or significant osseous findings. IMPRESSION: 1. Acute, diffuse, pancreatitis. Viability of the pancreatic parenchyma is difficult to assess given background fatty atrophy with areas of hypoattenuation representing either fatty infiltration or areas of hypoenhancement and threatened viability of the pancreatic parenchyma. Contrast enhanced MRI examination would be helpful for further evaluation if indicated. No peripancreatic fluid collections are identified. 2. Marked hepatic steatosis. 3. Mild splenomegaly. Electronically Signed   By:  Helyn Numbers M.D.   On: 09/19/2021 20:02    Procedures Procedures    Medications Ordered in ED Medications  HYDROmorphone (DILAUDID) injection 1 mg (has no administration in time range)  sodium chloride 0.9 % bolus 1,000 mL (has no administration in time range)  sodium chloride 0.9 % bolus 1,000 mL (1,000 mLs Intravenous New Bag/Given 09/19/21 1804)  ondansetron (ZOFRAN) injection 4 mg (4 mg Intravenous Given 09/19/21 1804)  HYDROmorphone (DILAUDID) injection 1 mg (1 mg Intravenous Given 09/19/21 1804)  iohexol (OMNIPAQUE) 300 MG/ML solution 100 mL (100 mLs Intravenous Contrast Given 09/19/21 1932)    ED Course/ Medical Decision Making/ A&P                           Medical Decision Making Amount and/or Complexity of Data Reviewed Labs: ordered. Radiology: ordered.  Risk Prescription drug management. Decision regarding hospitalization.   This patient presents to the ED for concern of abdominal pain, this involves an extensive number of treatment options, and is a complaint that carries with it a high risk of complications and morbidity.  The differential diagnosis includes pain or otitis or gallstones or gallbladder disease given history of heavy alcohol use and acute onset of pain this morning with eating and worse with postprandial timing.  Would also consider kidney stones though less likely given that this seems to be reproducible and not colic, less likely to be pyelonephritis and unlikely to be abdominal aortic aneurysm or appendicitis given location of the pain   Co morbidities that complicate the patient evaluation  Obesity   Additional history obtained:  Additional history obtained from electronic medical record External records from outside source obtained and reviewed including prior office visits, patient is followed by podiatry and family medicine because of a history of some elevated liver enzymes dating back to 2021, history of imaging reviewed and he has not had any  abdominal imaging since 2020 when he had an ultrasound requested by family doctor.  That showed a hyperechoic liver with some heterogeneity thought to be due to steatosis   Lab Tests:  I Ordered, and personally interpreted labs.  The pertinent results include: CBC panel lipase, lipase is 66, metabolic panel showed normal renal function and electrolytes however the liver function test were elevated at 120 for the AST and 211 for the ALT.  CBC showed a mild leukocytosis at 10,700 with a hemoglobin of 17.4.  Imaging Studies ordered:  I ordered imaging studies including CT scan of the abdomen and pelvis I independently visualized and interpreted imaging which showed acute diffuse pancreatitis without obvious pseudocyst or complicating factors I agree with the radiologist interpretation   Cardiac Monitoring: / EKG:  The patient was maintained on a cardiac monitor.  I personally viewed and interpreted the cardiac monitored which showed an underlying rhythm of: The patient has a mild sinus tachycardia, he is hypertensive, no arrhythmia otherwise   Consultations Obtained:  I requested consultation with the hospitalist,  and discussed lab and imaging findings as well as pertinent plan - they recommend: Admission   Problem List / ED Course / Critical interventions / Medication management  Patient with acute pancreatitis, required multiple doses of pain medication and IV fluids I ordered medication including Dilaudid, 1 mg x 2, IV fluids, 2 L for acute painful alcohol induced pancreatitis Reevaluation of the patient after these medicines showed that the patient improved but ongoing significant pain I have reviewed the patients home medicines and have made adjustments as needed   Social Determinants of Health:  Heavy alcohol use   Test / Admission - Considered:  Will need to be admitted to the hospital, will consult with hospitalist for admission         Final Clinical  Impression(s) / ED Diagnoses Final diagnoses:  Alcohol-induced acute pancreatitis, unspecified complication status    Rx / DC Orders ED Discharge Orders     None         Eber Hong, MD 09/19/21 2012

## 2021-09-19 NOTE — Assessment & Plan Note (Signed)
Start CIWA protocol with IV/po ativan.

## 2021-09-19 NOTE — H&P (Signed)
History and Physical    Eric Hodge EUM:[id number] DOB: 01-May-1985 DOA: 09/19/2021  DOS: the patient was seen and examined on 09/19/2021  PCP: Susy Frizzle, MD   Patient coming from: Home  I have personally briefly reviewed patient's old medical records in [facility]  CC: abd pain HPI: 36 year old male history of ADHD, obesity, alcohol abuse, presents to the ER today with sudden onset of abdominal pain that started around noon.  He states that he woke up around 10 AM.  He normally drinks 5-40 alcoholic drinks a day.  Normally drinks liquor.  He states he did not wake up with abdominal pain.  He states he had some chips this morning and then his stomach started hurting.  He then had some hot tea hot tea and then his abdominal pain increased.  He has never had pancreatitis or abdominal pain like this before.  No fever or chills.  No diarrhea.  He had some vomiting and dry heaving.  On arrival temp 98.2 heart rate 107 blood pressure 171/113 satting 99% on room air.  Interestingly, his lipase is only 66  Chemistry sodium 141, bicarb 18, BUN of 10, creatinine 0.9, glucose 155  White count of 10.7, hemoglobin 17.4, platelets of 203  CT abdomen demonstrates fatty atrophy of his pancreas.  There is diffuse peripancreatic inflammation.  There is some areas of hypoattenuation versus threatened viability of the pancreatic parenchyma.  MRCP with contrast was recommended.  [facility] hospitalist contacted for admission.   ED Course: Lipase of 66, CT scan shows pancreatitis with fatty atrophy of his pancreatic parenchyma.  Review of Systems:  Review of Systems  Constitutional: Negative.   HENT: Negative.    Eyes: Negative.   Respiratory: Negative.    Cardiovascular: Negative.   Gastrointestinal:  Positive for abdominal pain, nausea and vomiting. Negative for blood in stool and diarrhea.  Genitourinary: Negative.   Musculoskeletal:  Positive for joint pain.       Chronic  shoulder pain  Skin: Negative.   Neurological: Negative.   Endo/Heme/Allergies: Negative.   Psychiatric/Behavioral: Negative.    All other systems reviewed and are negative.   Past Medical History:  Diagnosis Date   ADHD (attention deficit hyperactivity disorder)    Allergy    Elevated LFTs    Hyperlipidemia    Retained orthopedic hardware 02/2017   right distal radius   Runny nose 03/06/2017   clear drainage, per pt.    Past Surgical History:  Procedure Laterality Date   CARPAL TUNNEL RELEASE Right 10/06/2016   Procedure: CARPAL TUNNEL RELEASE;  Surgeon: Leanora Cover, MD;  Location: [place];  Service: Orthopedics;  Laterality: Right;   FOREIGN BODY REMOVAL  09/08/2011   Procedure: REMOVAL FOREIGN BODY EXTREMITY;  Surgeon: Tennis Must, MD;  Location: [facility];  Service: Orthopedics;  Laterality: Left;  FOREIGN BODY REMOVAL LEFT LONG FINGER   HARDWARE REMOVAL Right 03/10/2017   Procedure: HARDWARE REMOVAL RIGHT WRIST;  Surgeon: Leanora Cover, MD;  Location: [place];  Service: Orthopedics;  Laterality: Right;   MASS EXCISION Left 10/29/2015   Procedure: LEFT WRIST EXCISION MASS;  Surgeon: Leanora Cover, MD;  Location: [place];  Service: Orthopedics;  Laterality: Left;  LEFT WRIST EXCISION MASS   OPEN REDUCTION INTERNAL FIXATION (ORIF) DISTAL RADIAL FRACTURE Right 10/06/2016   Procedure: OPEN REDUCTION INTERNAL FIXATION (ORIF) RIGHT DISTAL RADIUS;  Surgeon: Leanora Cover, MD;  Location: [facility];  Service: Orthopedics;  Laterality: Right;   [place] EXTRACTION  2012     reports that he has been smoking cigarettes. He has a 5.00 pack-year smoking history. He has never used smokeless tobacco. He reports current alcohol use of about 6.0 standard drinks of alcohol per week. He reports that he does not use drugs.  No Known Allergies  Family History  Problem Relation Age of Onset   Stroke Paternal  Grandmother    Hypertension Paternal Grandmother    Cancer Paternal Grandfather    Hyperlipidemia Paternal Grandfather    Coronary artery disease Father 70       At 62 had MI --> stent. 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 Authorizing Provider  atorvastatin (LIPITOR) 20 MG tablet TAKE 1 TABLET(20 MG) BY MOUTH AT BEDTIME Patient not taking: Reported on 10/28/2019 09/19/19   Susy Frizzle, MD  atorvastatin (LIPITOR) 20 MG tablet Take 1 tablet (20 mg total) by mouth at bedtime. 09/19/19   Susy Frizzle, MD  atorvastatin (LIPITOR) 20 MG tablet TAKE 1 TABLET BY MOUTH EVERY NIGHT AT BEDTIME. 09/28/20   Susy Frizzle, MD  azelastine (ASTELIN) 0.1 % nasal spray Place 2 sprays into both nostrils 2 (two) times daily. Use in each nostril as directed 11/01/17   Orlena Sheldon, PA-C  Cetirizine HCl (ZYRTEC ALLERGY) 10 MG CAPS Take 1 capsule (10 mg total) by mouth daily as needed. 11/01/17   Orlena Sheldon, PA-C  ketoconazole (NIZORAL) 2 % shampoo Apply 1 application topically 2 (two) times a week. 10/28/19   Susy Frizzle, MD  lisdexamfetamine (VYVANSE) 50 MG capsule Take 1 capsule (50 mg total) by mouth every morning. 11/12/20   Susy Frizzle, MD  montelukast (SINGULAIR) 10 MG tablet TAKE 1 TABLET(10 MG) BY MOUTH DAILY AS NEEDED FOR ALLERGIES 11/05/20   Susy Frizzle, MD  omeprazole (PRILOSEC) 20 MG capsule TAKE 1 CAPSULE(20 MG) BY MOUTH DAILY 12/24/19   Susy Frizzle, MD  triamcinolone cream (KENALOG) 0.1 % Apply 1 application topically 2 (two) times daily. 10/28/19   Susy Frizzle, MD    Physical Exam: Vitals:   09/19/21 1630 09/19/21 1650 09/19/21 2017  BP: (!) 172/113  (!) 173/96  Pulse: (!) 107  98  Resp: (!) 22  20  Temp: 98.2 F (36.8 C)    SpO2: 99%  98%  Weight:  127 kg   Height:  '5\' 9"'  (1.753 m)     Physical Exam Vitals and nursing note reviewed.  Constitutional:      General: He is not in  acute distress.    Appearance: Normal appearance. He is obese. He is not ill-appearing, toxic-appearing or diaphoretic.  HENT:     Head: Normocephalic and atraumatic.     Nose: Nose normal.  Eyes:     General: No scleral icterus. Cardiovascular:     Rate and Rhythm: Regular rhythm. Tachycardia present.     Pulses: Normal pulses.  Pulmonary:     Effort: Pulmonary effort is normal. No respiratory distress.     Breath sounds: Normal breath sounds. No wheezing or rales.  Abdominal:     General: Bowel sounds are normal.     Palpations: Abdomen is soft.     Tenderness: There is abdominal tenderness in the epigastric area. There is no guarding or rebound.  Musculoskeletal:     Right lower leg: No edema.  Left lower leg: No edema.  Skin:    General: Skin is warm and dry.     Capillary Refill: Capillary refill takes less than 2 seconds.  Neurological:     General: No focal deficit present.     Mental Status: He is alert and oriented to person, place, and time.      Labs on Admission: I have personally reviewed following labs and imaging studies  CBC: Recent Labs  Lab 09/19/21 1703  WBC 10.7*  HGB 17.4*  HCT 47.0  MCV 93.3  PLT 765   Basic Metabolic Panel: Recent Labs  Lab 09/19/21 1703  NA 141  K 3.9  CL 109  CO2 18*  GLUCOSE 155*  BUN 10  CREATININE 0.99  CALCIUM 9.9   GFR: Estimated Creatinine Clearance: 138.6 mL/min (by C-G formula based on SCr of 0.99 mg/dL). Liver Function Tests: Recent Labs  Lab 09/19/21 1703  AST 120*  ALT 211*  ALKPHOS 79  BILITOT 0.9  PROT 7.3  ALBUMIN 4.4   Recent Labs  Lab 09/19/21 1703  LIPASE 66*   No results for input(s): "AMMONIA" in the last 168 hours. Coagulation Profile: No results for input(s): "INR", "PROTIME" in the last 168 hours. Cardiac Enzymes: No results for input(s): "CKTOTAL", "CKMB", "CKMBINDEX", "TROPONINI", "TROPONINIHS" in the last 168 hours. BNP (last 3 results) No results for input(s): "PROBNP"  in the last 8760 hours. HbA1C: No results for input(s): "HGBA1C" in the last 72 hours. CBG: No results for input(s): "GLUCAP" in the last 168 hours. Lipid Profile: No results for input(s): "CHOL", "HDL", "LDLCALC", "TRIG", "CHOLHDL", "LDLDIRECT" in the last 72 hours. Thyroid Function Tests: No results for input(s): "TSH", "T4TOTAL", "FREET4", "T3FREE", "THYROIDAB" in the last 72 hours. Anemia Panel: No results for input(s): "VITAMINB12", "FOLATE", "FERRITIN", "TIBC", "IRON", "RETICCTPCT" in the last 72 hours. Urine analysis: No results found for: "COLORURINE", "APPEARANCEUR", "LABSPEC", "PHURINE", "GLUCOSEU", "HGBUR", "BILIRUBINUR", "KETONESUR", "PROTEINUR", "UROBILINOGEN", "NITRITE", "LEUKOCYTESUR"  Radiological Exams on Admission: I have personally reviewed images CT ABDOMEN PELVIS W CONTRAST  Result Date: 09/19/2021 CLINICAL DATA:  Abdominal pain, acute, nonlocalized EXAM: CT ABDOMEN AND PELVIS WITH CONTRAST TECHNIQUE: Multidetector CT imaging of the abdomen and pelvis was performed using the standard protocol following bolus administration of intravenous contrast. RADIATION DOSE REDUCTION: This exam was performed according to the departmental dose-optimization program which includes automated exposure control, adjustment of the mA and/or kV according to patient size and/or use of iterative reconstruction technique. CONTRAST:  130m OMNIPAQUE IOHEXOL 300 MG/ML  SOLN COMPARISON:  None Available. FINDINGS: Lower chest: No acute abnormality. Hepatobiliary: Marked hepatic steatosis. No enhancing intrahepatic mass. No intra or extrahepatic biliary ductal dilation. Gallbladder unremarkable. Pancreas: The pancreatic parenchyma demonstrates diffuse fatty atrophy. There is superimposed diffuse peripancreatic inflammatory stranding in keeping with superimposed diffuse, acute pancreatitis. Viability of the pancreatic parenchyma is difficult to assess given background fatty atrophy with areas of  hypoattenuation representing either fatty infiltration or areas of hypoenhancement representing threatened viability of the pancreatic parenchyma. No peripancreatic fluid collections are identified. Spleen: Mild splenomegaly with the spleen measuring 15.1 cm in greatest dimension. No intrasplenic lesions are seen. Splenic vein is patent. Adrenals/Urinary Tract: Adrenal glands are unremarkable. Kidneys are normal, without renal calculi, focal lesion, or hydronephrosis. Bladder is unremarkable. Stomach/Bowel: Stomach is within normal limits. Appendix appears normal. No evidence of bowel wall thickening, distention, or inflammatory changes. Vascular/Lymphatic: No significant vascular findings are present. No enlarged abdominal or pelvic lymph nodes. Reproductive: Prostate is unremarkable. Other: No abdominal wall hernia  or abnormality. No abdominopelvic ascites. Musculoskeletal: No acute or significant osseous findings. IMPRESSION: 1. Acute, diffuse, pancreatitis. Viability of the pancreatic parenchyma is difficult to assess given background fatty atrophy with areas of hypoattenuation representing either fatty infiltration or areas of hypoenhancement and threatened viability of the pancreatic parenchyma. Contrast enhanced MRI examination would be helpful for further evaluation if indicated. No peripancreatic fluid collections are identified. 2. Marked hepatic steatosis. 3. Mild splenomegaly. Electronically Signed   By: Fidela Salisbury M.D.   On: 09/19/2021 20:02    EKG: My personal interpretation of EKG shows: sinus tachycardia    Assessment/Plan Principal Problem:   Alcoholic pancreatitis Active Problems:   Elevated LFTs   Smoker   Alcohol abuse    Assessment and Plan: * Alcoholic pancreatitis Admit to med tele bed. Keep NPO. Start IVF. Dilaudid 1 mg q2h prn. TOC consult for ETOH rehab. Repeat lipase in AM. Strange that lipase is only 66. Will check lipid panel to rule out hypertriglyceridemia as  source of pancreatitis. No gallstones stone. LFTs are indicative of obstruction with normal alk phos. proceed with MRCP with IV contrast in the morning given areas of hypoattenuation and concern about pancreatic viability.  Elevated LFTs Likely due to alcohol. But typical ASTx2 : ALT ratio not seen. Check hep C ab.  Alcohol abuse Start CIWA protocol with IV/po ativan.  Smoker Pt declines nicotine patch.   DVT prophylaxis: SQ Heparin Code Status: Full Code Family Communication: no family at bedside  Disposition Plan: return homd  Consults called: none  Admission status: Observation, Telemetry bed   Kristopher Oppenheim, DO [facility] 09/19/2021, 8:47 PM

## 2021-09-19 NOTE — Subjective & Objective (Addendum)
CC: abd pain HPI: 36 year old male history of ADHD, obesity, alcohol abuse, presents to the ER today with sudden onset of abdominal pain that started around noon.  He states that he woke up around 10 AM.  He normally drinks 8-10 alcoholic drinks a day.  Normally drinks liquor.  He states he did not wake up with abdominal pain.  He states he had some chips this morning and then his stomach started hurting.  He then had some hot tea hot tea and then his abdominal pain increased.  He has never had pancreatitis or abdominal pain like this before.  No fever or chills.  No diarrhea.  He had some vomiting and dry heaving.  On arrival temp 98.2 heart rate 107 blood pressure 171/113 satting 99% on room air.  Interestingly, his lipase is only 66  Chemistry sodium 141, bicarb 18, BUN of 10, creatinine 0.9, glucose 155  White count of 10.7, hemoglobin 17.4, platelets of 203  CT abdomen demonstrates fatty atrophy of his pancreas.  There is diffuse peripancreatic inflammation.  There is some areas of hypoattenuation versus threatened viability of the pancreatic parenchyma.  MRCP with contrast was recommended.  [facility] hospitalist contacted for admission.

## 2021-09-19 NOTE — Assessment & Plan Note (Signed)
Pt declines nicotine patch. 

## 2021-09-19 NOTE — Assessment & Plan Note (Addendum)
Admit to med tele bed. Keep NPO. Start IVF. Dilaudid 1 mg q2h prn. TOC consult for ETOH rehab. Repeat lipase in AM. Strange that lipase is only 66. Will check lipid panel to rule out hypertriglyceridemia as source of pancreatitis. No gallstones stone. LFTs are indicative of obstruction with normal alk phos. proceed with MRCP with IV contrast in the morning given areas of hypoattenuation and concern about pancreatic viability.

## 2021-09-20 ENCOUNTER — Inpatient Hospital Stay (HOSPITAL_COMMUNITY): Payer: 59

## 2021-09-20 DIAGNOSIS — F1010 Alcohol abuse, uncomplicated: Secondary | ICD-10-CM | POA: Diagnosis not present

## 2021-09-20 DIAGNOSIS — R7989 Other specified abnormal findings of blood chemistry: Secondary | ICD-10-CM | POA: Diagnosis not present

## 2021-09-20 DIAGNOSIS — K852 Alcohol induced acute pancreatitis without necrosis or infection: Secondary | ICD-10-CM | POA: Diagnosis not present

## 2021-09-20 DIAGNOSIS — F17200 Nicotine dependence, unspecified, uncomplicated: Secondary | ICD-10-CM | POA: Diagnosis not present

## 2021-09-20 LAB — COMPREHENSIVE METABOLIC PANEL
ALT: 145 U/L — ABNORMAL HIGH (ref 0–44)
AST: 66 U/L — ABNORMAL HIGH (ref 15–41)
Albumin: 3.8 g/dL (ref 3.5–5.0)
Alkaline Phosphatase: 70 U/L (ref 38–126)
Anion gap: 10 (ref 5–15)
BUN: 8 mg/dL (ref 6–20)
CO2: 19 mmol/L — ABNORMAL LOW (ref 22–32)
Calcium: 8.6 mg/dL — ABNORMAL LOW (ref 8.9–10.3)
Chloride: 106 mmol/L (ref 98–111)
Creatinine, Ser: 0.99 mg/dL (ref 0.61–1.24)
GFR, Estimated: >60 mL/min (ref >60)
Glucose, Bld: 159 mg/dL — ABNORMAL HIGH (ref 70–99)
Potassium: 3.4 mmol/L — ABNORMAL LOW (ref 3.5–5.1)
Sodium: 135 mmol/L (ref 135–145)
Total Bilirubin: 1.3 mg/dL — ABNORMAL HIGH (ref 0.3–1.2)
Total Protein: 6.7 g/dL (ref 6.5–8.1)

## 2021-09-20 LAB — HIV ANTIBODY (ROUTINE TESTING W REFLEX): HIV Screen 4th Generation wRfx: NONREACTIVE

## 2021-09-20 LAB — LIPID PANEL
Cholesterol: 350 mg/dL — ABNORMAL HIGH (ref 0–200)
HDL: 59 mg/dL (ref >40)
LDL Cholesterol: 232 mg/dL — ABNORMAL HIGH (ref 0–99)
Total CHOL/HDL Ratio: 5.9 RATIO
Triglycerides: 297 mg/dL — ABNORMAL HIGH (ref <150)
VLDL: 59 mg/dL — ABNORMAL HIGH (ref 0–40)

## 2021-09-20 LAB — CBC WITH DIFFERENTIAL/PLATELET
Abs Immature Granulocytes: 0.06 10*3/uL (ref 0.00–0.07)
Basophils Absolute: 0 10*3/uL (ref 0.0–0.1)
Basophils Relative: 0 %
Eosinophils Absolute: 0 10*3/uL (ref 0.0–0.5)
Eosinophils Relative: 0 %
HCT: 49.6 % (ref 39.0–52.0)
Hemoglobin: 17.9 g/dL — ABNORMAL HIGH (ref 13.0–17.0)
Immature Granulocytes: 0 %
Lymphocytes Relative: 2 %
Lymphs Abs: 0.3 10*3/uL — ABNORMAL LOW (ref 0.7–4.0)
MCH: 34 pg (ref 26.0–34.0)
MCHC: 36.1 g/dL — ABNORMAL HIGH (ref 30.0–36.0)
MCV: 94.3 fL (ref 80.0–100.0)
Monocytes Absolute: 0.7 10*3/uL (ref 0.1–1.0)
Monocytes Relative: 5 %
Neutro Abs: 12.7 10*3/uL — ABNORMAL HIGH (ref 1.7–7.7)
Neutrophils Relative %: 93 %
Platelets: 186 10*3/uL (ref 150–400)
RBC: 5.26 MIL/uL (ref 4.22–5.81)
RDW: 12.5 % (ref 11.5–15.5)
WBC: 13.9 10*3/uL — ABNORMAL HIGH (ref 4.0–10.5)
nRBC: 0 % (ref 0.0–0.2)

## 2021-09-20 LAB — LIPASE, BLOOD: Lipase: 395 U/L — ABNORMAL HIGH (ref 11–51)

## 2021-09-20 LAB — GLUCOSE, CAPILLARY
Glucose-Capillary: 147 mg/dL — ABNORMAL HIGH (ref 70–99)
Glucose-Capillary: 169 mg/dL — ABNORMAL HIGH (ref 70–99)

## 2021-09-20 LAB — HEMOGLOBIN A1C
Hgb A1c MFr Bld: 4.9 % (ref 4.8–5.6)
Mean Plasma Glucose: 93.93 mg/dL

## 2021-09-20 LAB — HEPATITIS C ANTIBODY: HCV Ab: NONREACTIVE

## 2021-09-20 MED ORDER — CHLORDIAZEPOXIDE HCL 5 MG PO CAPS
25.0000 mg | ORAL_CAPSULE | Freq: Three times a day (TID) | ORAL | Status: AC
  Administered 2021-09-20 (×3): 25 mg via ORAL
  Filled 2021-09-20 (×2): qty 5
  Filled 2021-09-20: qty 1

## 2021-09-20 MED ORDER — ADULT MULTIVITAMIN W/MINERALS CH
1.0000 | ORAL_TABLET | Freq: Every day | ORAL | Status: DC
  Administered 2021-09-20 – 2021-09-25 (×6): 1 via ORAL
  Filled 2021-09-20 (×6): qty 1

## 2021-09-20 MED ORDER — LORAZEPAM 2 MG/ML IJ SOLN
1.0000 mg | INTRAMUSCULAR | Status: AC | PRN
  Administered 2021-09-21: 1 mg via INTRAVENOUS
  Filled 2021-09-20: qty 1

## 2021-09-20 MED ORDER — ACETAMINOPHEN 650 MG RE SUPP
650.0000 mg | RECTAL | Status: DC | PRN
Start: 2021-09-20 — End: 2021-09-25

## 2021-09-20 MED ORDER — FOLIC ACID 1 MG PO TABS
1.0000 mg | ORAL_TABLET | Freq: Every day | ORAL | Status: DC
  Administered 2021-09-20 – 2021-09-25 (×6): 1 mg via ORAL
  Filled 2021-09-20 (×6): qty 1

## 2021-09-20 MED ORDER — ONDANSETRON HCL 4 MG/2ML IJ SOLN: 4.0000 mg | Freq: Four times a day (QID) | INTRAMUSCULAR | Status: DC | PRN

## 2021-09-20 MED ORDER — HYDROMORPHONE HCL 1 MG/ML IJ SOLN
1.0000 mg | INTRAMUSCULAR | Status: DC | PRN
  Administered 2021-09-20 – 2021-09-24 (×14): 1 mg via INTRAVENOUS
  Filled 2021-09-20 (×15): qty 1

## 2021-09-20 MED ORDER — THIAMINE MONONITRATE 100 MG PO TABS
100.0000 mg | ORAL_TABLET | Freq: Every day | ORAL | Status: DC
  Administered 2021-09-21 – 2021-09-25 (×5): 100 mg via ORAL
  Filled 2021-09-20 (×5): qty 1

## 2021-09-20 MED ORDER — OXYCODONE HCL 5 MG PO TABS
5.0000 mg | ORAL_TABLET | ORAL | Status: DC | PRN
  Administered 2021-09-20 – 2021-09-24 (×18): 5 mg via ORAL
  Filled 2021-09-20 (×18): qty 1

## 2021-09-20 MED ORDER — HEPARIN SODIUM (PORCINE) 5000 UNIT/ML IJ SOLN
5000.0000 [IU] | Freq: Three times a day (TID) | INTRAMUSCULAR | Status: DC
Start: 2021-09-20 — End: 2021-09-20
  Administered 2021-09-20: 5000 [IU] via SUBCUTANEOUS
  Filled 2021-09-20: qty 1

## 2021-09-20 MED ORDER — TRAZODONE HCL 50 MG PO TABS
50.0000 mg | ORAL_TABLET | Freq: Every evening | ORAL | Status: DC | PRN
  Administered 2021-09-20 – 2021-09-24 (×5): 50 mg via ORAL
  Filled 2021-09-20 (×5): qty 1

## 2021-09-20 MED ORDER — CHLORDIAZEPOXIDE HCL 5 MG PO CAPS: 25.0000 mg | ORAL_CAPSULE | Freq: Every day | ORAL | Status: DC

## 2021-09-20 MED ORDER — IPRATROPIUM-ALBUTEROL 0.5-2.5 (3) MG/3ML IN SOLN: 3.0000 mL | RESPIRATORY_TRACT | Status: DC | PRN

## 2021-09-20 MED ORDER — HEPARIN SODIUM (PORCINE) 5000 UNIT/ML IJ SOLN: 5000.0000 [IU] | Freq: Three times a day (TID) | INTRAMUSCULAR | Status: DC

## 2021-09-20 MED ORDER — THIAMINE HCL 100 MG/ML IJ SOLN
100.0000 mg | Freq: Every day | INTRAMUSCULAR | Status: DC
  Administered 2021-09-20: 100 mg via INTRAVENOUS
  Filled 2021-09-20 (×2): qty 2

## 2021-09-20 MED ORDER — DEXTROSE IN LACTATED RINGERS 5 % IV SOLN
INTRAVENOUS | Status: DC
Start: 2021-09-20 — End: 2021-09-22

## 2021-09-20 MED ORDER — SENNOSIDES-DOCUSATE SODIUM 8.6-50 MG PO TABS: 1.0000 | ORAL_TABLET | Freq: Every evening | ORAL | Status: DC | PRN

## 2021-09-20 MED ORDER — GADOBUTROL 1 MMOL/ML IV SOLN
10.0000 mL | Freq: Once | INTRAVENOUS | Status: AC | PRN
  Administered 2021-09-20: 10 mL via INTRAVENOUS

## 2021-09-20 MED ORDER — METOPROLOL TARTRATE 5 MG/5ML IV SOLN
5.0000 mg | INTRAVENOUS | Status: DC | PRN
  Administered 2021-09-20 – 2021-09-23 (×8): 5 mg via INTRAVENOUS
  Filled 2021-09-20 (×8): qty 5

## 2021-09-20 MED ORDER — HYDRALAZINE HCL 20 MG/ML IJ SOLN: 10.0000 mg | INTRAMUSCULAR | Status: DC | PRN

## 2021-09-20 MED ORDER — GUAIFENESIN 100 MG/5ML PO LIQD: 5.0000 mL | ORAL | Status: DC | PRN

## 2021-09-20 MED ORDER — CHLORDIAZEPOXIDE HCL 5 MG PO CAPS
25.0000 mg | ORAL_CAPSULE | Freq: Two times a day (BID) | ORAL | Status: DC
  Filled 2021-09-20: qty 5

## 2021-09-20 MED ORDER — HEPARIN SODIUM (PORCINE) 5000 UNIT/ML IJ SOLN
5000.0000 [IU] | Freq: Three times a day (TID) | INTRAMUSCULAR | Status: DC
  Administered 2021-09-20 – 2021-09-25 (×14): 5000 [IU] via SUBCUTANEOUS
  Filled 2021-09-20 (×14): qty 1

## 2021-09-20 MED ORDER — DOCUSATE SODIUM 100 MG PO CAPS
100.0000 mg | ORAL_CAPSULE | Freq: Two times a day (BID) | ORAL | Status: DC
  Administered 2021-09-20 – 2021-09-25 (×10): 100 mg via ORAL
  Filled 2021-09-20 (×11): qty 1

## 2021-09-20 MED ORDER — LORAZEPAM 1 MG PO TABS: 1.0000 mg | ORAL_TABLET | ORAL | Status: AC | PRN

## 2021-09-20 MED ORDER — OXYCODONE HCL 5 MG PO TABS: 5.0000 mg | ORAL_TABLET | ORAL | Status: DC | PRN

## 2021-09-20 NOTE — ED Notes (Signed)
Pt to MRI

## 2021-09-20 NOTE — Progress Notes (Signed)
PROGRESS NOTE    Eric Hodge  NWG:[id number] DOB: [birth year] DOA: 09/19/2021 PCP: Donita Brooks, MD   Brief Narrative:  36 year old male history of ADHD, obesity, alcohol abuse, presents to the ER today with sudden onset of abdominal pain that started around noon on the day of admission.  At least drinks about 10 alcoholic drinks daily.  Lipase 66 on admission, CT abdomen pelvis showed concerns of peripancreatic inflammation and threatened viability pancreatic parenchyma.   Assessment & Plan:  Principal Problem:   Alcoholic pancreatitis Active Problems:   Elevated LFTs   Smoker   Alcohol abuse     Assessment and Plan: * Alcoholic pancreatitis -No evidence of gallstones.  Elevated LDL, triglycerides 297.  Total bilirubin normal, pain control, IV fluids, clear liquid diet bowel regimen.  We will go ahead and order MRI abdomen with contrast  Elevated LFTs Likely due to alcohol. But typical ASTx2 : ALT ratio not seen. Check hep C ab.  Hyperlipidemia - Eventually will need to start statin once his LFTs improve  Alcohol abuse No EtOH withdrawal protocol ordered, I will order it along with Librium taper. Order MV, Folic acid and thiamine.   Smoker Offered nicotine patch but he declined   DVT prophylaxis: Will order SQ Heparin Code Status: Full Code Family Communication:    Status is: Inpatient  Patient is still showing signs of withdrawal with tachycardia and hypertension.  He is also having abdominal pain.   Subjective: Seen and examined at bedside reporting of epigastric abdominal pain radiating to his back.  He is also slightly tremulous with tachycardia heart rate in 115's and blood pressure 160/120s Tells me his last drink was about 36 hours ago   Examination:  General exam: Appears calm and comfortable  Respiratory system: My bibasilar rhonchi Cardiovascular system: Sinus tachycardia Gastrointestinal system: Abdomen is nondistended, soft and nontender.  No organomegaly or masses felt. Normal bowel sounds heard. Central nervous system: Alert and oriented. No focal neurological deficits. Extremities: Symmetric 5 x 5 power. Skin: No rashes, lesions or ulcers Psychiatry: Judgement and insight appear normal. Mood & affect appropriate.     Objective: Vitals:   09/20/21 0338 09/20/21 0342 09/20/21 0600 09/20/21 0619  BP:   137/68   Pulse: 78 79 82 89  Resp: 16 16 15 13   Temp:    98.6 F (37 C)  SpO2: (!) 89% 94% 91% 93%  Weight:      Height:        Intake/Output Summary (Last 24 hours) at 09/20/2021 0800 Last data filed at 09/20/2021 0110 Gross per 24 hour  Intake 2000 ml  Output --  Net 2000 ml   Filed Weights   09/19/21 1650  Weight: 127 kg     Data Reviewed:   CBC: Recent Labs  Lab 09/19/21 1703  WBC 10.7*  HGB 17.4*  HCT 47.0  MCV 93.3  PLT 203   Basic Metabolic Panel: Recent Labs  Lab 09/19/21 1703  NA 141  K 3.9  CL 109  CO2 18*  GLUCOSE 155*  BUN 10  CREATININE 0.99  CALCIUM 9.9   GFR: Estimated Creatinine Clearance: 138.6 mL/min (by C-G formula based on SCr of 0.99 mg/dL). Liver Function Tests: Recent Labs  Lab 09/19/21 1703  AST 120*  ALT 211*  ALKPHOS 79  BILITOT 0.9  PROT 7.3  ALBUMIN 4.4   Recent Labs  Lab 09/19/21 1703  LIPASE 66*   No results for input(s): "AMMONIA" in the last 168 hours. Coagulation  Profile: No results for input(s): "INR", "PROTIME" in the last 168 hours. Cardiac Enzymes: No results for input(s): "CKTOTAL", "CKMB", "CKMBINDEX", "TROPONINI" in the last 168 hours. BNP (last 3 results) No results for input(s): "PROBNP" in the last 8760 hours. HbA1C: No results for input(s): "HGBA1C" in the last 72 hours. CBG: No results for input(s): "GLUCAP" in the last 168 hours. Lipid Profile: Recent Labs    09/19/21 1703  CHOL 350*  HDL 59  LDLCALC 232*  TRIG 297*  CHOLHDL 5.9   Thyroid Function Tests: No results for input(s): "TSH", "T4TOTAL", "FREET4",  "T3FREE", "THYROIDAB" in the last 72 hours. Anemia Panel: No results for input(s): "VITAMINB12", "FOLATE", "FERRITIN", "TIBC", "IRON", "RETICCTPCT" in the last 72 hours. Sepsis Labs: No results for input(s): "PROCALCITON", "LATICACIDVEN" in the last 168 hours.  No results found for this or any previous visit (from the past 240 hour(s)).       Radiology Studies: CT ABDOMEN PELVIS W CONTRAST  Result Date: 09/19/2021 CLINICAL DATA:  Abdominal pain, acute, nonlocalized EXAM: CT ABDOMEN AND PELVIS WITH CONTRAST TECHNIQUE: Multidetector CT imaging of the abdomen and pelvis was performed using the standard protocol following bolus administration of intravenous contrast. RADIATION DOSE REDUCTION: This exam was performed according to the departmental dose-optimization program which includes automated exposure control, adjustment of the mA and/or kV according to patient size and/or use of iterative reconstruction technique. CONTRAST:  OMNIPAQUE IOHEXOL 300 MG/ML  SOLN COMPARISON:  None Available. FINDINGS: Lower chest: No acute abnormality. Hepatobiliary: Marked hepatic steatosis. No enhancing intrahepatic mass. No intra or extrahepatic biliary ductal dilation. Gallbladder unremarkable. Pancreas: The pancreatic parenchyma demonstrates diffuse fatty atrophy. There is superimposed diffuse peripancreatic inflammatory stranding in keeping with superimposed diffuse, acute pancreatitis. Viability of the pancreatic parenchyma is difficult to assess given background fatty atrophy with areas of hypoattenuation representing either fatty infiltration or areas of hypoenhancement representing threatened viability of the pancreatic parenchyma. No peripancreatic fluid collections are identified. Spleen: Mild splenomegaly with the spleen measuring 15.1 cm in greatest dimension. No intrasplenic lesions are seen. Splenic vein is patent. Adrenals/Urinary Tract: Adrenal glands are unremarkable. Kidneys are normal, without  renal calculi, focal lesion, or hydronephrosis. Bladder is unremarkable. Stomach/Bowel: Stomach is within normal limits. Appendix appears normal. No evidence of bowel wall thickening, distention, or inflammatory changes. Vascular/Lymphatic: No significant vascular findings are present. No enlarged abdominal or pelvic lymph nodes. Reproductive: Prostate is unremarkable. Other: No abdominal wall hernia or abnormality. No abdominopelvic ascites. Musculoskeletal: No acute or significant osseous findings. IMPRESSION: 1. Acute, diffuse, pancreatitis. Viability of the pancreatic parenchyma is difficult to assess given background fatty atrophy with areas of hypoattenuation representing either fatty infiltration or areas of hypoenhancement and threatened viability of the pancreatic parenchyma. Contrast enhanced MRI examination would be helpful for further evaluation if indicated. No peripancreatic fluid collections are identified. 2. Marked hepatic steatosis. 3. Mild splenomegaly. Electronically Signed   By: Helyn Numbers M.D.   On: 09/19/2021 20:02        Scheduled Meds:  pantoprazole (PROTONIX) IV  40 mg Intravenous Q12H   Continuous Infusions:  lactated ringers 100 mL/hr at 09/19/21 2138     LOS: 1 day   Time spent= 35 mins    Sinclair Alligood Joline Maxcy, MD [facility]  If 7PM-7AM, please contact night-coverage  09/20/2021, 8:00 AM

## 2021-09-20 NOTE — Progress Notes (Signed)
NEW ADMISSION NOTE New Admission Note:   Arrival Method:  Patient arrived in w/c from ED Mental Orientation: Alert and oriented x 4. Telemetry: [id number] Assessment: Completed Skin: warm, dry and intact. IV: L FA infusing LR 125 ml/hr Pain: 7/10 administered IV dilaudid Tubes: N/A Safety Measures: Safety Fall Prevention Plan has been given, discussed and signed Admission: in progress 5 [facility] Orientation: Patient has been orientated to the room, unit and staff.  Family: NOne  Orders have been reviewed and implemented. Will continue to monitor the patient. Call light has been placed within reach and bed alarm has been activated.   Arvilla Meres, RN

## 2021-09-20 NOTE — ED Notes (Signed)
Back from mri

## 2021-09-21 DIAGNOSIS — K852 Alcohol induced acute pancreatitis without necrosis or infection: Secondary | ICD-10-CM | POA: Diagnosis not present

## 2021-09-21 DIAGNOSIS — F1010 Alcohol abuse, uncomplicated: Secondary | ICD-10-CM | POA: Diagnosis not present

## 2021-09-21 DIAGNOSIS — R7989 Other specified abnormal findings of blood chemistry: Secondary | ICD-10-CM | POA: Diagnosis not present

## 2021-09-21 LAB — CBC
HCT: 48.1 % (ref 39.0–52.0)
Hemoglobin: 17.7 g/dL — ABNORMAL HIGH (ref 13.0–17.0)
MCH: 34.5 pg — ABNORMAL HIGH (ref 26.0–34.0)
MCHC: 36.8 g/dL — ABNORMAL HIGH (ref 30.0–36.0)
MCV: 93.8 fL (ref 80.0–100.0)
Platelets: 183 10*3/uL (ref 150–400)
RBC: 5.13 MIL/uL (ref 4.22–5.81)
RDW: 12.5 % (ref 11.5–15.5)
WBC: 17.6 10*3/uL — ABNORMAL HIGH (ref 4.0–10.5)
nRBC: 0 % (ref 0.0–0.2)

## 2021-09-21 LAB — COMPREHENSIVE METABOLIC PANEL
ALT: 115 U/L — ABNORMAL HIGH (ref 0–44)
AST: 60 U/L — ABNORMAL HIGH (ref 15–41)
Albumin: 3.4 g/dL — ABNORMAL LOW (ref 3.5–5.0)
Alkaline Phosphatase: 64 U/L (ref 38–126)
Anion gap: 10 (ref 5–15)
BUN: 10 mg/dL (ref 6–20)
CO2: 24 mmol/L (ref 22–32)
Calcium: 8.8 mg/dL — ABNORMAL LOW (ref 8.9–10.3)
Chloride: 103 mmol/L (ref 98–111)
Creatinine, Ser: 1.3 mg/dL — ABNORMAL HIGH (ref 0.61–1.24)
GFR, Estimated: >60 mL/min (ref >60)
Glucose, Bld: 148 mg/dL — ABNORMAL HIGH (ref 70–99)
Potassium: 4.3 mmol/L (ref 3.5–5.1)
Sodium: 137 mmol/L (ref 135–145)
Total Bilirubin: 2.3 mg/dL — ABNORMAL HIGH (ref 0.3–1.2)
Total Protein: 6.4 g/dL — ABNORMAL LOW (ref 6.5–8.1)

## 2021-09-21 LAB — LACTIC ACID, PLASMA: Lactic Acid, Venous: 1.3 mmol/L (ref 0.5–1.9)

## 2021-09-21 LAB — GLUCOSE, CAPILLARY
Glucose-Capillary: 171 mg/dL — ABNORMAL HIGH (ref 70–99)
Glucose-Capillary: 161 mg/dL — ABNORMAL HIGH (ref 70–99)

## 2021-09-21 LAB — MAGNESIUM: Magnesium: 1.4 mg/dL — ABNORMAL LOW (ref 1.7–2.4)

## 2021-09-21 LAB — LIPASE, BLOOD: Lipase: 173 U/L — ABNORMAL HIGH (ref 11–51)

## 2021-09-21 MED ORDER — SODIUM CHLORIDE 0.9 % IV SOLN
1.0000 g | Freq: Three times a day (TID) | INTRAVENOUS | Status: DC
  Filled 2021-09-21 (×2): qty 20

## 2021-09-21 MED ORDER — SODIUM CHLORIDE 0.9 % IV SOLN
2.0000 g | Freq: Three times a day (TID) | INTRAVENOUS | Status: DC
  Administered 2021-09-21 – 2021-09-22 (×4): 2 g via INTRAVENOUS
  Filled 2021-09-21 (×3): qty 12.5

## 2021-09-21 MED ORDER — METRONIDAZOLE 500 MG/100ML IV SOLN
500.0000 mg | Freq: Two times a day (BID) | INTRAVENOUS | Status: DC
  Administered 2021-09-21 – 2021-09-22 (×3): 500 mg via INTRAVENOUS
  Filled 2021-09-21 (×3): qty 100

## 2021-09-21 MED ORDER — SODIUM CHLORIDE 0.9 % IV BOLUS
1000.0000 mL | Freq: Once | INTRAVENOUS | Status: AC
  Administered 2021-09-21: 1000 mL via INTRAVENOUS

## 2021-09-21 MED ORDER — PANTOPRAZOLE SODIUM 40 MG PO TBEC
40.0000 mg | DELAYED_RELEASE_TABLET | Freq: Every day | ORAL | Status: DC
Start: 2021-09-22 — End: 2021-09-25
  Administered 2021-09-22 – 2021-09-25 (×4): 40 mg via ORAL
  Filled 2021-09-21 (×4): qty 1

## 2021-09-21 MED ORDER — CHLORDIAZEPOXIDE HCL 5 MG PO CAPS
25.0000 mg | ORAL_CAPSULE | Freq: Three times a day (TID) | ORAL | Status: DC
Start: 2021-09-21 — End: 2021-09-22
  Administered 2021-09-21 – 2021-09-22 (×4): 25 mg via ORAL
  Filled 2021-09-21 (×3): qty 5

## 2021-09-21 MED ORDER — MAGNESIUM SULFATE 2 GM/50ML IV SOLN
2.0000 g | Freq: Once | INTRAVENOUS | Status: AC
  Administered 2021-09-21: 2 g via INTRAVENOUS
  Filled 2021-09-21: qty 50

## 2021-09-21 NOTE — Progress Notes (Signed)
PROGRESS NOTE    Lottie Siska  AOZ:[id number] DOB: 1985/05/17 DOA: 09/19/2021 PCP: Donita Brooks, MD   Brief Narrative:  36 year old male history of ADHD, obesity, alcohol abuse, presents to the ER today with sudden onset of abdominal pain that started around noon on the day of admission.  At least drinks about 10 alcoholic drinks daily.  Lipase 66 on admission, CT abdomen pelvis showed concerns of peripancreatic inflammation and threatened viability pancreatic parenchyma.  MRCP showed possible pseudocyst/necrosis? Due to rising WBC and low-grade temperature I have started him on meropenem.  GI team consulted.   Assessment & Plan:  Principal Problem:   Alcoholic pancreatitis Active Problems:   Elevated LFTs   Smoker   Alcohol abuse     Assessment and Plan: * Alcoholic pancreatitis, complicated -No evidence of gallstones.  Elevated LDL, triglycerides 297.  Total bilirubin normal, pain control, IV fluids, currently n.p.o., antiemetics.  Abnormal MRCP concerning for pseudocyst/peripancreatic edema/necrosis.  Due to rising WBC and low-grade temperature I have empirically started him on IV cefepime/Flagyl.  [facility] GI team consulted. Bcx ordered   Elevated LFTs Likely due to alcohol. But typical ASTx2 : ALT ratio not seen. Check hep C ab.  Acute kidney injury - Baseline creatinine 0.9.  This morning 1.3.  Continue aggressive IV fluids.  Hypomagnesemia - Repletion  Hyperlipidemia - Holding off on statin until LFTs improve  Alcohol abuse Due to concerns of significant alcohol withdrawal, start him on Librium 25 mg 3 times daily.  Slowly taper off when appropriate. Multivitamin, folic acid and thiamine  Smoker Offered nicotine patch but he declined   DVT prophylaxis: heparin injection 5,000 Units Start: 09/20/21 1830 SCDs Start: 09/20/21 1545Will order SQ Heparin Code Status: Full Code Family Communication:    Status is: Inpatient  Still has significant abdominal pain  with rising WBC and low-grade temperature.  Maintain hospital stay   Subjective: Seen and examined at bedside, still having significant abdominal pain which is radiating to his back.   Examination:  Constitutional: Not in acute distress Respiratory: Clear to auscultation bilaterally Cardiovascular: Tachycardia, normal sinus rhythm, no rubs Abdomen: Tender to deep palpation nondistended good bowel sounds Musculoskeletal: No edema noted Skin: No rashes seen Neurologic: CN 2-12 grossly intact.  And nonfocal Psychiatric: Normal judgment and insight. Alert and oriented x 3. Normal mood.  Objective: Vitals:   09/21/21 0200 09/21/21 0330 09/21/21 0432 09/21/21 0627  BP:    (!) 154/101  Pulse: (!) 131 (!) 130 (!) 127 (!) 139  Resp: 20 20  18   Temp:    99.9 F (37.7 C)  TempSrc:      SpO2:    95%  Weight:      Height:        Intake/Output Summary (Last 24 hours) at 09/21/2021 0805 Last data filed at 09/21/2021 0700 Gross per 24 hour  Intake 50 ml  Output 400 ml  Net -350 ml   Filed Weights   09/19/21 1650 09/20/21 1606  Weight: 127 kg 127.9 kg     Data Reviewed:   CBC: Recent Labs  Lab 09/19/21 1703 09/20/21 1620 09/21/21 0443  WBC 10.7* 13.9* 17.6*  NEUTROABS  --  12.7*  --   HGB 17.4* 17.9* 17.7*  HCT 47.0 49.6 48.1  MCV 93.3 94.3 93.8  PLT 203 186 183   Basic Metabolic Panel: Recent Labs  Lab 09/19/21 1703 09/20/21 1620 09/21/21 0226  NA 141 135 137  K 3.9 3.4* 4.3  CL 109 106 103  CO2 18* 19* 24  GLUCOSE 155* 159* 148*  BUN 10 8 10   CREATININE 0.99 0.99 1.30*  CALCIUM 9.9 8.6* 8.8*  MG  --   --  1.4*   GFR: Estimated Creatinine Clearance: 106 mL/min (A) (by C-G formula based on SCr of 1.3 mg/dL (H)). Liver Function Tests: Recent Labs  Lab 09/19/21 1703 09/20/21 1620 09/21/21 0226  AST 120* 66* 60*  ALT 211* 145* 115*  ALKPHOS 79 70 64  BILITOT 0.9 1.3* 2.3*  PROT 7.3 6.7 6.4*  ALBUMIN 4.4 3.8 3.4*   Recent Labs  Lab 09/19/21 1703  09/20/21 1620  LIPASE 66* 395*   No results for input(s): "AMMONIA" in the last 168 hours. Coagulation Profile: No results for input(s): "INR", "PROTIME" in the last 168 hours. Cardiac Enzymes: No results for input(s): "CKTOTAL", "CKMB", "CKMBINDEX", "TROPONINI" in the last 168 hours. BNP (last 3 results) No results for input(s): "PROBNP" in the last 8760 hours. HbA1C: Recent Labs    09/20/21 1620  HGBA1C 4.9   CBG: Recent Labs  Lab 09/20/21 1811 09/20/21 2323 09/21/21 0722  GLUCAP 147* 169* 171*   Lipid Profile: Recent Labs    09/19/21 1703  CHOL 350*  HDL 59  LDLCALC 232*  TRIG 297*  CHOLHDL 5.9   Thyroid Function Tests: No results for input(s): "TSH", "T4TOTAL", "FREET4", "T3FREE", "THYROIDAB" in the last 72 hours. Anemia Panel: No results for input(s): "VITAMINB12", "FOLATE", "FERRITIN", "TIBC", "IRON", "RETICCTPCT" in the last 72 hours. Sepsis Labs: Recent Labs  Lab 09/21/21 0443  LATICACIDVEN 1.3    No results found for this or any previous visit (from the past 240 hour(s)).       Radiology Studies: MR ABDOMEN MRCP W WO CONTAST  Result Date: 09/20/2021 CLINICAL DATA:  Acute pancreatitis. EXAM: MRI ABDOMEN WITHOUT AND WITH CONTRAST (INCLUDING MRCP) TECHNIQUE: Multiplanar multisequence MR imaging of the abdomen was performed both before and after the administration of intravenous contrast. Heavily T2-weighted images of the biliary and pancreatic ducts were obtained, and three-dimensional MRCP images were rendered by post processing. CONTRAST:  48mL GADAVIST GADOBUTROL 1 MMOL/ML IV SOLN COMPARISON:  CT scan 09/19/2021 FINDINGS: Lower chest: Dependent atelectasis bilaterally. Hepatobiliary: Liver is enlarged at 23.8 cm craniocaudal length. Diffuse loss of signal intensity in the liver parenchyma on out of phase T1 imaging is compatible with fatty deposition. Postcontrast imaging is markedly motion degraded. Within this limitation there is no gross enhancing  mass lesion within the liver. Small or subtle lesions could be obscured. There is no evidence for gallstones, gallbladder wall thickening, or pericholecystic fluid. No intrahepatic or extrahepatic biliary dilation. No choledocholithiasis. Pancreas: Fine detail of pancreatic parenchyma is obscured by breathing motion. Pancreatic parenchyma is diffusely fatty replaced. As noted on CT, pancreatic parenchyma is diffusely edematous with associated peripancreatic/retroperitoneal edema. Imaging features compatible with pancreatitis. Assessment for pancreatic parenchymal enhancement is degraded by the fatty replacement and substantial motion artifact. Spleen:  No splenomegaly. No focal mass lesion. Adrenals/Urinary Tract: No adrenal nodule or mass. Kidneys unremarkable. Stomach/Bowel: Stomach is unremarkable. No gastric wall thickening. No evidence of outlet obstruction. No small bowel or colonic dilatation within the visualized abdomen. Vascular/Lymphatic: No abdominal aortic aneurysm. No abdominal lymphadenopathy. Other: Retroperitoneal edema noted with edema in the left upper quadrant. 3.8 x 7.8 x 2.7 cm fluid collection is seen adjacent to the gastric antrum (axial 34/5). This collection shows no rim enhancement. Musculoskeletal: No focal suspicious marrow enhancement within the visualized bony anatomy. IMPRESSION: 1. Markedly motion degraded study. 2.  Hepatomegaly with hepatic steatosis. 3. Diffuse pancreatic edema with associated peripancreatic/retroperitoneal edema. Imaging features compatible with acute pancreatitis. Assessment for pancreatic necrosis limited by substantial motion artifact on this study and diffuse fatty replacement of pancreatic parenchyma. 4. 3.8 x 7.8 x 2.7 cm fluid collection adjacent to the gastric antrum. No organized or enhancing rim at this time. As such, this may simply reflect a collection of confluent edema. Follow-up could be used to assess for evolution into pseudocyst or abscess as  clinically warranted. 5. No evidence for gallstones. No biliary dilatation. No choledocholithiasis. 6. Dependent atelectasis bilaterally. Electronically Signed   By: Kennith Center M.D.   On: 09/20/2021 12:05   CT ABDOMEN PELVIS W CONTRAST  Result Date: 09/19/2021 CLINICAL DATA:  Abdominal pain, acute, nonlocalized EXAM: CT ABDOMEN AND PELVIS WITH CONTRAST TECHNIQUE: Multidetector CT imaging of the abdomen and pelvis was performed using the standard protocol following bolus administration of intravenous contrast. RADIATION DOSE REDUCTION: This exam was performed according to the departmental dose-optimization program which includes automated exposure control, adjustment of the mA and/or kV according to patient size and/or use of iterative reconstruction technique. CONTRAST:  OMNIPAQUE IOHEXOL 300 MG/ML  SOLN COMPARISON:  None Available. FINDINGS: Lower chest: No acute abnormality. Hepatobiliary: Marked hepatic steatosis. No enhancing intrahepatic mass. No intra or extrahepatic biliary ductal dilation. Gallbladder unremarkable. Pancreas: The pancreatic parenchyma demonstrates diffuse fatty atrophy. There is superimposed diffuse peripancreatic inflammatory stranding in keeping with superimposed diffuse, acute pancreatitis. Viability of the pancreatic parenchyma is difficult to assess given background fatty atrophy with areas of hypoattenuation representing either fatty infiltration or areas of hypoenhancement representing threatened viability of the pancreatic parenchyma. No peripancreatic fluid collections are identified. Spleen: Mild splenomegaly with the spleen measuring 15.1 cm in greatest dimension. No intrasplenic lesions are seen. Splenic vein is patent. Adrenals/Urinary Tract: Adrenal glands are unremarkable. Kidneys are normal, without renal calculi, focal lesion, or hydronephrosis. Bladder is unremarkable. Stomach/Bowel: Stomach is within normal limits. Appendix appears normal. No evidence of bowel  wall thickening, distention, or inflammatory changes. Vascular/Lymphatic: No significant vascular findings are present. No enlarged abdominal or pelvic lymph nodes. Reproductive: Prostate is unremarkable. Other: No abdominal wall hernia or abnormality. No abdominopelvic ascites. Musculoskeletal: No acute or significant osseous findings. IMPRESSION: 1. Acute, diffuse, pancreatitis. Viability of the pancreatic parenchyma is difficult to assess given background fatty atrophy with areas of hypoattenuation representing either fatty infiltration or areas of hypoenhancement and threatened viability of the pancreatic parenchyma. Contrast enhanced MRI examination would be helpful for further evaluation if indicated. No peripancreatic fluid collections are identified. 2. Marked hepatic steatosis. 3. Mild splenomegaly. Electronically Signed   By: Helyn Numbers M.D.   On: 09/19/2021 20:02        Scheduled Meds:  chlordiazePOXIDE  25 mg Oral BID   Followed by   Melene Muller ON 09/22/2021] chlordiazePOXIDE  25 mg Oral Q1500   docusate sodium  100 mg Oral BID   folic acid  1 mg Oral Daily   heparin  5,000 Units Subcutaneous Q8H   multivitamin with minerals  1 tablet Oral Daily   pantoprazole (PROTONIX) IV  40 mg Intravenous Q12H   thiamine  100 mg Oral Daily   Or   thiamine  100 mg Intravenous Daily   Continuous Infusions:  dextrose 5% lactated ringers 125 mL/hr at 09/21/21 0628     LOS: 2 days   Time spent= 35 mins    Selah Klang Joline Maxcy, MD [facility]  If 7PM-7AM, please contact night-coverage  09/21/2021, 8:05 AM

## 2021-09-21 NOTE — Progress Notes (Addendum)
On call doc informed about concern about tachycardia on metoprolol q4  given twice for the shift hr sustaining in the 130's on rest the metoprolol works for an  1hr then patient graudally goes back to 120's-130's. no c/o of chest pain just basline abdominal pain which he has gotten prn pain meds for. See new orders

## 2021-09-21 NOTE — Consult Note (Addendum)
[facility] Consult: 9:40 AM 09/21/2021  LOS: 2 days    Referring Provider: Dr Reesa Chew  Primary Care Physician:  Susy Frizzle, MD Primary Gastroenterologist:  unassigned     Reason for Consultation:  ETOH pancreatitis.     HPI: Eric Hodge is a 36 y.o. male.  PMH ETOH use disorder.  Obesity.  ADHD.  Ganglion cyst.  R wrist surgery. Elevated LFTs date to at least 2020.  Fatty liver, hepatic steatosis seen on ultrasound in 02/2018.  Drinks 750 mL whiskey every 2 days.    Patient had some chips to eat yesterday morning, later in the morning he developed abdominal pain primarily in the epigastric region thing across the upper abdomen bilaterally.  No associated nausea or vomiting..  It was severe enough he presented to the ED for evaluation. Lipase initially 66 2 days ago, bounced to 395 yesterday and 173 today T. bili 2.3.  Alkaline phosphatase 79.  AST/ALT 120/211... 60/115. Creatinine has gone from 0.9.  1.3.  BUN normal ranging between 8 and 10.  Sodium normal.  Potassium 3.4, corrected today to 4.3 glucose as high as 159.. WBCs 17.6.  Hgb 17.7.  Platelets 183  CTAP shows acute, diffuse pancreatitis.  Viability of pancreatic parenchyma difficult to assess due to background fatty atrophy.  Areas of pancreatic hypoattenuation are either fatty infiltrations or areas of hypoenhancement/threaten viability of parenchyma.  MRI might help define area better.  Marked hepatosteatosis.  Splenomegaly. MRI abdomen.  Study motion degraded but reveals hepatomegaly, hepatic steatosis and diffuse changes of acute pancreatitis.  Assessment of necrosis limited due to motion artifact and diffuse fatty replacement of the pancreatic parenchyma.  Retroperitoneal edema in LUQ.  3.8 x 7.8 x 2.7 cm fluid collection adjacent to  gastric antrum without organized/enhancing rim.  May reflect collection of confluent edema versus early pseudocyst versus early abscess.  No gallstones.  Biliary ducts normal.  No choledocholithiasis.  Patient has not had any nausea or vomiting.  He takes OTC omeprazole every other day with good control of reflux symptoms.  Daily bowel movements.  Last bowel movement was a couple of days ago.  Current pain management strategy effective though requiring repeated doses of narcotics when pain recurs.  Family history of several first-degree relatives with alcohol use disorder.  Got the impression that this included his father as the patient did not want to list specific relatives who have problems with alcohol since his father was in the room.  No family history of pancreatitis.  No family history of liver disease.  Paternal grandfather had lung cancer. Patient is single, lives alone.  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  Past Medical History:  Diagnosis Date   ADHD (attention deficit hyperactivity disorder)    Allergy    Elevated LFTs    Hyperlipidemia    Retained orthopedic hardware 02/2017   right distal radius   Runny nose 03/06/2017   clear drainage, per pt.    Past Surgical History:  Procedure Laterality Date   CARPAL TUNNEL RELEASE Right 10/06/2016   Procedure:  CARPAL TUNNEL RELEASE;  Surgeon: Betha Loa, MD;  Location: [facility];  Service: Orthopedics;  Laterality: Right;   FOREIGN BODY REMOVAL  09/08/2011   Procedure: REMOVAL FOREIGN BODY EXTREMITY;  Surgeon: Tami Ribas, MD;  Location: [facility];  Service: Orthopedics;  Laterality: Left;  FOREIGN BODY REMOVAL LEFT LONG FINGER   HARDWARE REMOVAL Right 03/10/2017   Procedure: HARDWARE REMOVAL RIGHT WRIST;  Surgeon: Betha Loa, MD;  Location: [facility];  Service: Orthopedics;  Laterality: Right;   MASS EXCISION Left 10/29/2015   Procedure: LEFT WRIST EXCISION MASS;  Surgeon: Betha Loa, MD;  Location: [facility];  Service: Orthopedics;  Laterality: Left;  LEFT WRIST EXCISION MASS   OPEN REDUCTION INTERNAL FIXATION (ORIF) DISTAL RADIAL FRACTURE Right 10/06/2016   Procedure: OPEN REDUCTION INTERNAL FIXATION (ORIF) RIGHT DISTAL RADIUS;  Surgeon: Betha Loa, MD;  Location: [facility];  Service: Orthopedics;  Laterality: Right;   WISDOM TOOTH EXTRACTION  2012    Prior to Admission medications   Medication Sig Start Date End Date Taking? Authorizing Provider  azelastine (ASTELIN) 0.1 % nasal spray Place 2 sprays into both nostrils 2 (two) times daily. Use in each nostril as directed 11/01/17  Yes Allayne Butcher B, PA-C  Cetirizine HCl (ZYRTEC ALLERGY) 10 MG CAPS Take 1 capsule (10 mg total) by mouth daily as needed. 11/01/17  Yes Allayne Butcher B, PA-C  omeprazole (PRILOSEC) 20 MG capsule TAKE 1 CAPSULE(20 MG) BY MOUTH DAILY 12/24/19  Yes Donita Brooks, MD  triamcinolone cream (KENALOG) 0.1 % Apply 1 application topically 2 (two) times daily. 10/28/19  Yes Donita Brooks, MD  atorvastatin (LIPITOR) 20 MG tablet TAKE 1 TABLET BY MOUTH EVERY NIGHT AT BEDTIME. Patient not taking: Reported on 09/19/2021 09/28/20   Donita Brooks, MD  ketoconazole (NIZORAL) 2 % shampoo Apply 1 application topically 2 (two) times a week. Patient not taking: Reported on 09/19/2021 10/28/19   Donita Brooks, MD  lisdexamfetamine (VYVANSE) 50 MG capsule Take 1 capsule (50 mg total) by mouth every morning. Patient not taking: Reported on 09/19/2021 11/12/20   Donita Brooks, MD  montelukast (SINGULAIR) 10 MG tablet TAKE 1 TABLET(10 MG) BY MOUTH DAILY AS NEEDED FOR ALLERGIES Patient not taking: Reported on 09/19/2021 11/05/20   Donita Brooks, MD    Scheduled Meds:  chlordiazePOXIDE  25 mg Oral TID   docusate sodium  100 mg Oral BID   folic acid  1 mg Oral Daily   heparin  5,000 Units Subcutaneous Q8H   multivitamin with minerals  1 tablet Oral Daily    pantoprazole (PROTONIX) IV  40 mg Intravenous Q12H   thiamine  100 mg Oral Daily   Or   thiamine  100 mg Intravenous Daily   Infusions:  ceFEPime (MAXIPIME) IV     dextrose 5% lactated ringers 125 mL/hr at 09/21/21 0865   metronidazole     PRN Meds: acetaminophen, guaiFENesin, hydrALAZINE, HYDROmorphone (DILAUDID) injection, ipratropium-albuterol, LORazepam **OR** LORazepam, metoprolol tartrate, ondansetron (ZOFRAN) IV, oxyCODONE, senna-docusate, traZODone   Allergies as of 09/19/2021   (No Known Allergies)    Family History  Problem Relation Age of Onset   Stroke Paternal Grandmother    Hypertension Paternal Grandmother    Cancer Paternal Grandfather    Hyperlipidemia Paternal Grandfather    Coronary artery disease Father 62       At 51 had MI --> stent. This was his 1st dx CAD  Social History   Socioeconomic History   Marital status: Divorced    Spouse name: Not on file   Number of children: Not on file   Years of education: Not on file   Highest education level: Not on file  Occupational History   Not on file  Tobacco Use   Smoking status: Every Day    Packs/day: 0.50    Years: 10.00    Total pack years: 5.00    Types: Cigarettes   Smokeless tobacco: Never  Vaping Use   Vaping Use: Never used  Substance and Sexual Activity   Alcohol use: Yes    Alcohol/week: 6.0 standard drinks of alcohol    Types: 6 Shots of liquor per week    Comment: 4-5 days per week   Drug use: No   Sexual activity: Not Currently  Other Topics Concern   Not on file  Social History Narrative   Not on file   Social Determinants of Health   Financial Resource Strain: Not on file  Food Insecurity: Not on file  Transportation Needs: Not on file  Physical Activity: Not on file  Stress: Not on file  Social Connections: Not on file  Intimate Partner Violence: Not on file    REVIEW OF SYSTEMS: Constitutional: Feels somewhat weak but no profound fatigue. ENT:  No nose  bleeds Pulm: No shortness of breath CV:  No palpitations, no LE edema.  GU:  No hematuria, no frequency GI: See HPI. Heme: No unusual bleeding or bruising. Transfusions: None. Neuro:  No headaches, no peripheral tingling or numbness.  No tremors, no syncope.  Seizures.  No hallucinations no limb weakness. Derm:  No itching, no rash or sores.  Endocrine:  No sweats or chills.  No polyuria or dysuria Immunization: Reviewed.  He has had Moderna COVID vaccines at least twice.  He has been vaccinated for hepatitis B.  Tdap is current Travel:  not queried   PHYSICAL EXAM: Vital signs in last 24 hours: Vitals:   09/21/21 0627 09/21/21 0925  BP: (!) 154/101   Pulse: (!) 139 (!) 1  Resp: 18   Temp: 99.9 F (37.7 C)   SpO2: 95%    Wt Readings from Last 3 Encounters:  09/20/21 127.9 kg  10/28/19 117.5 kg  12/06/18 114.8 kg    General: Patient is obese, somewhat diaphoretic and looks moderately ill, moderately toxic. Head: Facial asymmetry or swelling.  No signs of head trauma. Eyes: Conjunctiva pink.  No scleral icterus Ears: Not hard of hearing Nose: No congestion or discharge Mouth: Mucosa is moist, pink, clear.  Tongue midline. Neck: No JVD, masses, thyromegaly Lungs: Clear bilaterally somewhat reduced breath sounds.  No dyspnea or cough Heart: Tacky but regular.  No MRG.  S1, S2 present Abdomen: Obese, soft, slight distention.  Tenderness across the upper abdomen.  No guarding or rebound.  Bowel sounds quiet.  No organomegaly, bruits, hernias.   Rectal: Deferred Musc/Skeltl: No joint redness, swelling or gross deformity. Extremities: No CCE Neurologic: Slightly tremulous.  No confusion.  Fully oriented and able to provide excellent history.  Moves all 4 limbs. Skin: No jaundice.  No sores, rashes, telangiectasia. Nodes: No cervical adenopathy Psych: Calm, pleasant, cooperative.  Intake/Output from previous day: 09/04 0701 - 09/05 0700 In: 50 [P.O.:50] Out: 400  [Urine:400] Intake/Output this shift: No intake/output data recorded.  LAB RESULTS: Recent Labs    09/19/21 1703 09/20/21 1620 09/21/21 0443  WBC 10.7* 13.9* 17.6*  HGB 17.4* 17.9* 17.7*  HCT 47.0 49.6 48.1  PLT 203 186 183   BMET Lab Results  Component Value Date   NA 137 09/21/2021   NA 135 09/20/2021   NA 141 09/19/2021   K 4.3 09/21/2021   K 3.4 (L) 09/20/2021   K 3.9 09/19/2021   CL 103 09/21/2021   CL 106 09/20/2021   CL 109 09/19/2021   CO2 24 09/21/2021   CO2 19 (L) 09/20/2021   CO2 18 (L) 09/19/2021   GLUCOSE 148 (H) 09/21/2021   GLUCOSE 159 (H) 09/20/2021   GLUCOSE 155 (H) 09/19/2021   BUN 10 09/21/2021   BUN 8 09/20/2021   BUN 10 09/19/2021   CREATININE 1.30 (H) 09/21/2021   CREATININE 0.99 09/20/2021   CREATININE 0.99 09/19/2021   CALCIUM 8.8 (L) 09/21/2021   CALCIUM 8.6 (L) 09/20/2021   CALCIUM 9.9 09/19/2021   LFT Recent Labs    09/19/21 1703 09/20/21 1620 09/21/21 0226  PROT 7.3 6.7 6.4*  ALBUMIN 4.4 3.8 3.4*  AST 120* 66* 60*  ALT 211* 145* 115*  ALKPHOS 79 70 64  BILITOT 0.9 1.3* 2.3*   PT/INR No results found for: "INR", "PROTIME" Hepatitis Panel Recent Labs    09/19/21 1625  HCVAB NON REACTIVE   C-Diff No components found for: "CDIFF" Lipase     Component Value Date/Time   LIPASE 173 (H) 09/21/2021 0827    Drugs of Abuse  No results found for: "LABOPIA", "COCAINSCRNUR", "LABBENZ", "AMPHETMU", "THCU", "LABBARB"   RADIOLOGY STUDIES: MR ABDOMEN MRCP W WO CONTAST  Result Date: 09/20/2021 CLINICAL DATA:  Acute pancreatitis. EXAM: MRI ABDOMEN WITHOUT AND WITH CONTRAST (INCLUDING MRCP) TECHNIQUE: Multiplanar multisequence MR imaging of the abdomen was performed both before and after the administration of intravenous contrast. Heavily T2-weighted images of the biliary and pancreatic ducts were obtained, and three-dimensional MRCP images were rendered by post processing. CONTRAST:  85mL GADAVIST GADOBUTROL 1 MMOL/ML IV SOLN  COMPARISON:  CT scan 09/19/2021 FINDINGS: Lower chest: Dependent atelectasis bilaterally. Hepatobiliary: Liver is enlarged at 23.8 cm craniocaudal length. Diffuse loss of signal intensity in the liver parenchyma on out of phase T1 imaging is compatible with fatty deposition. Postcontrast imaging is markedly motion degraded. Within this limitation there is no gross enhancing mass lesion within the liver. Small or subtle lesions could be obscured. There is no evidence for gallstones, gallbladder wall thickening, or pericholecystic fluid. No intrahepatic or extrahepatic biliary dilation. No choledocholithiasis. Pancreas: Fine detail of pancreatic parenchyma is obscured by breathing motion. Pancreatic parenchyma is diffusely fatty replaced. As noted on CT, pancreatic parenchyma is diffusely edematous with associated peripancreatic/retroperitoneal edema. Imaging features compatible with pancreatitis. Assessment for pancreatic parenchymal enhancement is degraded by the fatty replacement and substantial motion artifact. Spleen:  No splenomegaly. No focal mass lesion. Adrenals/Urinary Tract: No adrenal nodule or mass. Kidneys unremarkable. Stomach/Bowel: Stomach is unremarkable. No gastric wall thickening. No evidence of outlet obstruction. No small bowel or colonic dilatation within the visualized abdomen. Vascular/Lymphatic: No abdominal aortic aneurysm. No abdominal lymphadenopathy. Other: Retroperitoneal edema noted with edema in the left upper quadrant. 3.8 x 7.8 x 2.7 cm fluid collection is seen adjacent to the gastric antrum (axial 34/5). This collection shows no rim enhancement. Musculoskeletal: No focal suspicious marrow enhancement within the visualized bony anatomy. IMPRESSION: 1. Markedly motion degraded study. 2. Hepatomegaly with hepatic steatosis. 3. Diffuse pancreatic edema with associated peripancreatic/retroperitoneal edema. Imaging features compatible with acute pancreatitis. Assessment for pancreatic  necrosis limited by substantial motion artifact on this study and diffuse fatty  replacement of pancreatic parenchyma. 4. 3.8 x 7.8 x 2.7 cm fluid collection adjacent to the gastric antrum. No organized or enhancing rim at this time. As such, this may simply reflect a collection of confluent edema. Follow-up could be used to assess for evolution into pseudocyst or abscess as clinically warranted. 5. No evidence for gallstones. No biliary dilatation. No choledocholithiasis. 6. Dependent atelectasis bilaterally. Electronically Signed   By: Misty Stanley M.D.   On: 09/20/2021 12:05   CT ABDOMEN PELVIS W CONTRAST  Result Date: 09/19/2021 CLINICAL DATA:  Abdominal pain, acute, nonlocalized EXAM: CT ABDOMEN AND PELVIS WITH CONTRAST TECHNIQUE: Multidetector CT imaging of the abdomen and pelvis was performed using the standard protocol following bolus administration of intravenous contrast. RADIATION DOSE REDUCTION: This exam was performed according to the departmental dose-optimization program which includes automated exposure control, adjustment of the mA and/or kV according to patient size and/or use of iterative reconstruction technique. CONTRAST:  132mL OMNIPAQUE IOHEXOL 300 MG/ML  SOLN COMPARISON:  None Available. FINDINGS: Lower chest: No acute abnormality. Hepatobiliary: Marked hepatic steatosis. No enhancing intrahepatic mass. No intra or extrahepatic biliary ductal dilation. Gallbladder unremarkable. Pancreas: The pancreatic parenchyma demonstrates diffuse fatty atrophy. There is superimposed diffuse peripancreatic inflammatory stranding in keeping with superimposed diffuse, acute pancreatitis. Viability of the pancreatic parenchyma is difficult to assess given background fatty atrophy with areas of hypoattenuation representing either fatty infiltration or areas of hypoenhancement representing threatened viability of the pancreatic parenchyma. No peripancreatic fluid collections are identified. Spleen: Mild  splenomegaly with the spleen measuring 15.1 cm in greatest dimension. No intrasplenic lesions are seen. Splenic vein is patent. Adrenals/Urinary Tract: Adrenal glands are unremarkable. Kidneys are normal, without renal calculi, focal lesion, or hydronephrosis. Bladder is unremarkable. Stomach/Bowel: Stomach is within normal limits. Appendix appears normal. No evidence of bowel wall thickening, distention, or inflammatory changes. Vascular/Lymphatic: No significant vascular findings are present. No enlarged abdominal or pelvic lymph nodes. Reproductive: Prostate is unremarkable. Other: No abdominal wall hernia or abnormality. No abdominopelvic ascites. Musculoskeletal: No acute or significant osseous findings. IMPRESSION: 1. Acute, diffuse, pancreatitis. Viability of the pancreatic parenchyma is difficult to assess given background fatty atrophy with areas of hypoattenuation representing either fatty infiltration or areas of hypoenhancement and threatened viability of the pancreatic parenchyma. Contrast enhanced MRI examination would be helpful for further evaluation if indicated. No peripancreatic fluid collections are identified. 2. Marked hepatic steatosis. 3. Mild splenomegaly. Electronically Signed   By: Fidela Salisbury M.D.   On: 09/19/2021 20:02      IMPRESSION:     Acute ETOH pancreatitis. ? Early necrosis?  Day 1 Maxipime, metronidazole.      ETOH abuse    Fatty liver.  ALT >> AST is not c/w ETOH hepatitis.      PLAN:       Supportive care.  Increased lactated Ringer's to 200 mL/h.  Allow clear liquids, ordered.  Switched to po PPI.  Follow  cmet.     Azucena Freed  09/21/2021, 9:40 AM Phone [phone]   Attending physician's note  I have taken a history, reviewed the chart and examined the patient. I performed a substantive portion of this encounter, including complete performance of at least one of the key components, in conjunction with the APP. I agree with the APP's note,  impression and recommendations.    36 year old male with history of ADHD, active alcohol abuse admitted with acute alcoholic pancreatitis and mild alcoholic hepatitis DF less than 32 No evidence of cholelithiasis or  choledocholithiasis  IV fluids Ice chips and sips of water as tolerated Pain control Supportive care  Leukocytosis likely reactive in the setting of acute pancreatitis.  No evidence of infected pancreatic necrosis.  Consider discontinuing antibiotics if infectious work-up is negative    The patient was provided an opportunity to ask questions and all were answered. The patient agreed with the plan and demonstrated an understanding of the instructions.  Damaris Hippo , MD [phone]

## 2021-09-21 NOTE — Progress Notes (Addendum)
Pharmacy Antibiotic Note  Eric Hodge is a 36 y.o. male admitted on 09/19/2021 with alcoholic pancreatitis with new concern for intraabdominal infection  Pharmacy has been consulted for meropenem dosing.  Patient with no history of resistant organisms. D/C Dr. Nelson Chimes, will change patient to cefepime/flagyl instead.   Plan: Meropenem >>Cefepime 2gm IV q8h and flagyl 500mg  IV q12h Monitor for clinical course, C&S and deescalation  Height: 5\' 9"  (175.3 cm) Weight: 127.9 kg (282 lb) IBW/kg (Calculated) : 70.7  Temp (24hrs), Avg:99 F (37.2 C), Min:97.9 F (36.6 C), Max:100.3 F (37.9 C)  Recent Labs  Lab 09/19/21 1703 09/20/21 1620 09/21/21 0226 09/21/21 0443  WBC 10.7* 13.9*  --  17.6*  CREATININE 0.99 0.99 1.30*  --   LATICACIDVEN  --   --   --  1.3    Estimated Creatinine Clearance: 106 mL/min (A) (by C-G formula based on SCr of 1.3 mg/dL (H)).    No Known Allergies  Antimicrobials this admission: 9/5 cefepime >> 9/5 Flagyl >>   Dose adjustments this admission: N/a  Microbiology results:   Donella Pascarella A. 11/5, PharmD, BCPS, FNKF Clinical Pharmacist [place] Please utilize Amion for appropriate phone number to reach the unit pharmacist [facility])  09/21/2021 8:32 AM

## 2021-09-21 NOTE — TOC CAGE-AID Note (Signed)
Transition of Care [facility]) - CAGE-AID Screening   Patient Details  Name: Eric Hodge MRN: [id number] Date of Birth: [birth year]  Transition of Care [facility] CM/SW Contact:    Tom-Johnson, Hershal Coria, RN Phone Number: 09/21/2021, 2:36 PM   Clinical Narrative:  Patient is admitted for Alcoholic Pancreatitis. Lives at home with his son. Both parents and two siblings are supportive. Currently employed at [employer], independent with care prior to admission.  PCP is Pickard, Priscille Heidelberg, MD and uses [facility] on [address].   Substance abuse education done. Patient states he is ready to quit drinking because he don't want to be in this type  of pain anymore. Educational materials and resources given to patient at bedside.   No PT/OT needs noted, CM will continue to follow as patient progresses with care.  CAGE-AID Screening:    Have You Ever Felt You Ought to Cut Down on Your Drinking or Drug Use?: Yes Have People Annoyed You By Critizing Your Drinking Or Drug Use?: No Have You Felt Bad Or Guilty About Your Drinking Or Drug Use?: No Have You Ever Had a Drink or Used Drugs First Thing In The Morning to Steady Your Nerves or to Get Rid of a Hangover?: No CAGE-AID Score: 1  Substance Abuse Education Offered: Yes  Substance abuse interventions: Transport planner

## 2021-09-22 DIAGNOSIS — E66813 Obesity, class 3: Secondary | ICD-10-CM

## 2021-09-22 DIAGNOSIS — E876 Hypokalemia: Secondary | ICD-10-CM

## 2021-09-22 DIAGNOSIS — N179 Acute kidney failure, unspecified: Secondary | ICD-10-CM

## 2021-09-22 DIAGNOSIS — R7989 Other specified abnormal findings of blood chemistry: Secondary | ICD-10-CM | POA: Diagnosis not present

## 2021-09-22 DIAGNOSIS — F1010 Alcohol abuse, uncomplicated: Secondary | ICD-10-CM | POA: Diagnosis not present

## 2021-09-22 DIAGNOSIS — K852 Alcohol induced acute pancreatitis without necrosis or infection: Secondary | ICD-10-CM | POA: Diagnosis not present

## 2021-09-22 LAB — GLUCOSE, CAPILLARY
Glucose-Capillary: 135 mg/dL — ABNORMAL HIGH (ref 70–99)
Glucose-Capillary: 150 mg/dL — ABNORMAL HIGH (ref 70–99)
Glucose-Capillary: 153 mg/dL — ABNORMAL HIGH (ref 70–99)
Glucose-Capillary: 163 mg/dL — ABNORMAL HIGH (ref 70–99)
Glucose-Capillary: 168 mg/dL — ABNORMAL HIGH (ref 70–99)

## 2021-09-22 LAB — COMPREHENSIVE METABOLIC PANEL
ALT: 51 U/L — ABNORMAL HIGH (ref 0–44)
AST: 25 U/L (ref 15–41)
Albumin: 2.4 g/dL — ABNORMAL LOW (ref 3.5–5.0)
Alkaline Phosphatase: 50 U/L (ref 38–126)
Anion gap: 9 (ref 5–15)
BUN: 11 mg/dL (ref 6–20)
CO2: 20 mmol/L — ABNORMAL LOW (ref 22–32)
Calcium: 8 mg/dL — ABNORMAL LOW (ref 8.9–10.3)
Chloride: 108 mmol/L (ref 98–111)
Creatinine, Ser: 1.18 mg/dL (ref 0.61–1.24)
GFR, Estimated: >60 mL/min (ref >60)
Glucose, Bld: 130 mg/dL — ABNORMAL HIGH (ref 70–99)
Potassium: 3 mmol/L — ABNORMAL LOW (ref 3.5–5.1)
Sodium: 137 mmol/L (ref 135–145)
Total Bilirubin: 1.6 mg/dL — ABNORMAL HIGH (ref 0.3–1.2)
Total Protein: 5.5 g/dL — ABNORMAL LOW (ref 6.5–8.1)

## 2021-09-22 LAB — MAGNESIUM: Magnesium: 1.8 mg/dL (ref 1.7–2.4)

## 2021-09-22 LAB — LIPASE, BLOOD: Lipase: 65 U/L — ABNORMAL HIGH (ref 11–51)

## 2021-09-22 MED ORDER — LORATADINE 10 MG PO TABS
10.0000 mg | ORAL_TABLET | Freq: Every day | ORAL | Status: DC
Start: 2021-09-22 — End: 2021-09-25
  Administered 2021-09-22 – 2021-09-25 (×4): 10 mg via ORAL
  Filled 2021-09-22 (×4): qty 1

## 2021-09-22 MED ORDER — CHLORDIAZEPOXIDE HCL 5 MG PO CAPS
25.0000 mg | ORAL_CAPSULE | Freq: Two times a day (BID) | ORAL | Status: DC
  Administered 2021-09-22 – 2021-09-23 (×2): 25 mg via ORAL
  Filled 2021-09-22 (×2): qty 5

## 2021-09-22 MED ORDER — MAGNESIUM SULFATE 2 GM/50ML IV SOLN
2.0000 g | Freq: Once | INTRAVENOUS | Status: AC
  Administered 2021-09-22: 2 g via INTRAVENOUS
  Filled 2021-09-22: qty 50

## 2021-09-22 MED ORDER — POTASSIUM CHLORIDE 2 MEQ/ML IV SOLN: INTRAVENOUS | Status: DC

## 2021-09-22 MED ORDER — KCL IN DEXTROSE-NACL 20-5-0.45 MEQ/L-%-% IV SOLN
INTRAVENOUS | Status: DC
Start: 2021-09-22 — End: 2021-09-24
  Filled 2021-09-22 (×9): qty 1000

## 2021-09-22 NOTE — Progress Notes (Addendum)
Daily Rounding Note  09/22/2021, 1:23 PM  LOS: 3 days   SUBJECTIVE:   Chief complaint:    ETOH pancreatitis  Heart rate has been tachycardia in the 1 teens to 140s.  Diastolic blood pressures in the 90s to 100s with normal systolic blood pressures.  No fevers.  Oxygen saturations on room air in the mid to upper 90s.    Continues to have significant pain in the epigastric region.  No nausea or vomiting.  Not taking much of clear liquid diet.  No bowel movements but is belching when he sits or stands up.  Net I/O +140 mL today, 1.6 L + for this admission.   Urine output 775 ml yest, 400 thus far today.    OBJECTIVE:         Vital signs in last 24 hours:    Temp:  [98.2 F (36.8 C)-98.9 F (37.2 C)] 98.9 F (37.2 C) (09/06 0949) Pulse Rate:  [110-143] 124 (09/06 0949) Resp:  [18-26] 20 (09/06 0949) BP: (125-146)/(75-103) 146/96 (09/06 0949) SpO2:  [92 %-100 %] 94 % (09/06 0949) Last BM Date : 09/20/21 Filed Weights   09/19/21 1650 09/20/21 1606  Weight: 127 kg 127.9 kg   General: Looks ill and uncomfortable.  Diaphoretic.  Nurse at bedside about to give him pain meds. Heart: Tachy, regular. Chest: Clear bilaterally though breath sounds overall diminished.  No cough.  No labored breathing Abdomen: Obese, tender across the upper abdomen but primarily in the epigastric region.  No guarding or rebound.  Some distention. Extremities: No CCE. Neuro/Psych: Alert.  Not confused.  No tremors or gross weakness.  Intake/Output from previous day: 09/05 0701 - 09/06 0700 In: 660 [P.O.:660] Out: 775 [Urine:775]  Intake/Output this shift: Total I/O In: 540 [P.O.:540] Out: -   Lab Results: Recent Labs    09/19/21 1703 09/20/21 1620 09/21/21 0443  WBC 10.7* 13.9* 17.6*  HGB 17.4* 17.9* 17.7*  HCT 47.0 49.6 48.1  PLT 203 186 183   BMET Recent Labs    09/20/21 1620 09/21/21 0226 09/22/21 0803  NA 135 137 137  K  3.4* 4.3 3.0*  CL 106 103 108  CO2 19* 24 20*  GLUCOSE 159* 148* 130*  BUN 8 10 11   CREATININE 0.99 1.30* 1.18  CALCIUM 8.6* 8.8* 8.0*   LFT Recent Labs    09/20/21 1620 09/21/21 0226 09/22/21 0803  PROT 6.7 6.4* 5.5*  ALBUMIN 3.8 3.4* 2.4*  AST 66* 60* 25  ALT 145* 115* 51*  ALKPHOS 70 64 50  BILITOT 1.3* 2.3* 1.6*   PT/INR No results for input(s): "LABPROT", "INR" in the last 72 hours. Hepatitis Panel Recent Labs    09/19/21 1625  HCVAB NON REACTIVE    Studies/Results: No results found.   Scheduled Meds:  chlordiazePOXIDE  25 mg Oral BID   docusate sodium  100 mg Oral BID   folic acid  1 mg Oral Daily   heparin  5,000 Units Subcutaneous Q8H   multivitamin with minerals  1 tablet Oral Daily   pantoprazole  40 mg Oral Q0600   thiamine  100 mg Oral Daily   Or   thiamine  100 mg Intravenous Daily   Continuous Infusions:  dextrose 5 % and 0.45 % NaCl with KCl 20 mEq/L     magnesium sulfate bolus IVPB     PRN Meds:.acetaminophen, guaiFENesin, hydrALAZINE, HYDROmorphone (DILAUDID) injection, ipratropium-albuterol, LORazepam **OR** LORazepam, metoprolol tartrate, ondansetron (ZOFRAN) IV,  oxyCODONE, senna-docusate, traZODone   ASSESMENT:      Acute ETOH pancreatitis.  Likely evolving fluid collections/pseudocyst in region of gastric antrum.  No evidence of biliary disease or etiology to the pancreatitis.  Lipase improving, near normal at 65 today.  ? Early pancreatic necrosis.  Metronidazole and cefepime discontinued    ETOH use disorder.   Note tachycardia, may be experiencing some withdrawal.     Fatty liver, hepatomegaly.  AST << ALT so not entirely c/w ETOH hepatitis.  LFTs are all down trending. HCV non reactive.       Splenomegaly.  Platelets normal.      DM2 vs glucose intolerance.  A1c normal at 4.9.    Hypokalemia.  Has additional K included in his IV fluids.  Received IV magnesium once yesterday and again this afternoon.  PLAN   The IV fluids  were reduced to 100 mL earlier today.  I am going to up these to 150 mL now.    Jennye Moccasin  09/22/2021, 1:23 PM Phone [phone]    Attending physician's note   I have taken a history, reviewed the chart and examined the patient. I performed a substantive portion of this encounter, including complete performance of at least one of the key components, in conjunction with the APP. I agree with the APP's note, impression and recommendations.     Severe alcoholic pancreatitis Discussed in detail with patient the potential sequelae of severe pancreatitis and also the possibility of prolonged timeline for recovery Discussed alcohol cessation  Sips of clear liquids as tolerated, avoid advancing diet if he is experiencing significant pain. If he is not able to advance diet in the next few days, may need to consider post duodenal nasogastric tube placement for enteral nutrition  We will arrange for GI office follow-up visit and consider repeat imaging in 2 to 3 months to document resolution of pancreatitis/exclude any complications  Continue supportive care  GI is available if needed, please call with any questions.  The patient was provided an opportunity to ask questions and all were answered. The patient agreed with the plan and demonstrated an understanding of the instructions.   Iona Beard , MD [phone]

## 2021-09-22 NOTE — Progress Notes (Addendum)
PROGRESS NOTE    Eric Hodge  TTS:[id number] DOB: 07-Aug-1985 DOA: 09/19/2021 PCP: Donita Brooks, MD   Brief Narrative:  36 year old male with past medical history of ADHD, obesity, alcohol abuse presented to hospital with abdominal pain.  Patient stated that he drinks at least 10 alcoholic drinks daily.  Lipase was 66 on presentation.  CT scan of the abdomen showed peripancreatic inflammation.  MRCP showed pseudocyst pancreatic edema and question of necrosis.  He did have some leukocytosis and low-grade fevers so GI was consulted.  At this time patient is afebrile, has reactive leukocytosis and GI recommends de-escalation of antibiotic.  Clinically improving but still on clears.   Assessment & Plan:  Principal Problem:   Alcoholic pancreatitis Active Problems:   Elevated LFTs   Smoker   Alcohol abuse   Hypokalemia   Hypomagnesemia   AKI (acute kidney injury) (HCC)   Obesity, Class III, BMI 40-49.9 (morbid obesity) (HCC)  * Alcoholic pancreatitis, complicated Nonalcoholic.  Abnormal MRCP with pseudocyst/peripancreatic edema.  GI on board.  Afebrile today but has leukocytosis could be secondary to pancreatitis related.  Continue IV fluids.  Elevated LDL, triglycerides 297.  Blood cultures negative in less than 24 hours.  De-escalate antibiotics today..  Elevated LFTs Likely due to alcohol.  Monitor LFTs.  Hepatitis C antibody negative HIV negative..  Acute kidney injury Receiving IV fluids.  Latest creatinine of 1.1.  Hypomagnesemia Improved with replacement.  Latest magnesium of 1.8.  We will give 1 dose of magnesium sulfate 2 g today.  Hyperlipidemia Statins on hold.  Latest ALT of 51.  Hypokalemia.  We will replace with IV fluids.  Check BMP in AM.  Alcohol abuse On Librium multivitamin folic acid and thiamine.  Watch closely for alcohol withdrawal.  Currently stable.  Will decrease Librium to twice daily from 3 times daily dose.  Smoker Declined nicotine  patch.  Morbid obesity. Body mass index is 41.64 kg/m.  Present on admission.  Would benefit from weight loss as outpatient.   DVT prophylaxis: heparin injection 5,000 Units Start: 09/20/21 1830 SCDs Start: 09/20/21 1545  Code Status: Full Code  Family Communication: None at bedside  Status is: Inpatient, pending clinical improvement, leukocytosis, on clears, electrolyte imbalances.  Subjective: Today, patient was seen and examined at bedside.  Denies any nausea vomiting fever or chills.  Has not had a bowel movement.  Feels bloated and has a dry mouth.  Physical examination: General:  Average built, not in obvious distress HENT:   No scleral pallor or icterus noted. Oral mucosa is moist.  Chest:  Clear breath sounds.  Diminished breath sounds bilaterally. No crackles or wheezes.  CVS: S1 &S2 heard. No murmur.  Mildly tachycardic.  Regular rate and rhythm. Abdomen: Soft, mild epigastric tenderness on palpation., nondistended.  Bowel sounds are heard.   Extremities: No cyanosis, clubbing or edema.  Peripheral pulses are palpable. Psych: Alert, awake and oriented, normal mood CNS:  No cranial nerve deficits.  Power equal in all extremities.   Skin: Warm and dry.  No rashes noted.   Objective: Vitals:   09/22/21 0500 09/22/21 0501 09/22/21 0949 09/22/21 1659  BP: (!) 135/101  (!) 146/96 (!) 131/91  Pulse: (!) 123 (!) 122 (!) 124 94  Resp: 18  20 18   Temp: 98.6 F (37 C)  98.9 F (37.2 C) 98.1 F (36.7 C)  TempSrc:   Oral Oral  SpO2: 94% 94% 94% 94%  Weight:      Height:  Intake/Output Summary (Last 24 hours) at 09/22/2021 1747 Last data filed at 09/22/2021 1332 Gross per 24 hour  Intake 1260 ml  Output 800 ml  Net 460 ml   Filed Weights   09/19/21 1650 09/20/21 1606  Weight: 127 kg 127.9 kg     Data Reviewed: I have reviewed the following labs and imaging studies.  CBC: Recent Labs  Lab 09/19/21 1703 09/20/21 1620 09/21/21 0443  WBC 10.7* 13.9* 17.6*   NEUTROABS  --  12.7*  --   HGB 17.4* 17.9* 17.7*  HCT 47.0 49.6 48.1  MCV 93.3 94.3 93.8  PLT 203 186 183   Basic Metabolic Panel: Recent Labs  Lab 09/19/21 1703 09/20/21 1620 09/21/21 0226 09/22/21 0803  NA 141 135 137 137  K 3.9 3.4* 4.3 3.0*  CL 109 106 103 108  CO2 18* 19* 24 20*  GLUCOSE 155* 159* 148* 130*  BUN 10 8 10 11   CREATININE 0.99 0.99 1.30* 1.18  CALCIUM 9.9 8.6* 8.8* 8.0*  MG  --   --  1.4* 1.8   GFR: Estimated Creatinine Clearance: 116.8 mL/min (by C-G formula based on SCr of 1.18 mg/dL). Liver Function Tests: Recent Labs  Lab 09/19/21 1703 09/20/21 1620 09/21/21 0226 09/22/21 0803  AST 120* 66* 60* 25  ALT 211* 145* 115* 51*  ALKPHOS 79 70 64 50  BILITOT 0.9 1.3* 2.3* 1.6*  PROT 7.3 6.7 6.4* 5.5*  ALBUMIN 4.4 3.8 3.4* 2.4*   Recent Labs  Lab 09/19/21 1703 09/20/21 1620 09/21/21 0827 09/22/21 0803  LIPASE 66* 395* 173* 65*   No results for input(s): "AMMONIA" in the last 168 hours. Coagulation Profile: No results for input(s): "INR", "PROTIME" in the last 168 hours. Cardiac Enzymes: No results for input(s): "CKTOTAL", "CKMB", "CKMBINDEX", "TROPONINI" in the last 168 hours. BNP (last 3 results) No results for input(s): "PROBNP" in the last 8760 hours. HbA1C: Recent Labs    09/20/21 1620  HGBA1C 4.9   CBG: Recent Labs  Lab 09/21/21 1203 09/22/21 0102 09/22/21 0612 09/22/21 1147 09/22/21 1653  GLUCAP 161* 168* 135* 153* 163*   Lipid Profile: No results for input(s): "CHOL", "HDL", "LDLCALC", "TRIG", "CHOLHDL", "LDLDIRECT" in the last 72 hours.  Thyroid Function Tests: No results for input(s): "TSH", "T4TOTAL", "FREET4", "T3FREE", "THYROIDAB" in the last 72 hours. Anemia Panel: No results for input(s): "VITAMINB12", "FOLATE", "FERRITIN", "TIBC", "IRON", "RETICCTPCT" in the last 72 hours. Sepsis Labs: Recent Labs  Lab 09/21/21 0443  LATICACIDVEN 1.3    Recent Results (from the past 240 hour(s))  Culture, blood (Routine  X 2) w Reflex to ID Panel     Status: None (Preliminary result)   Collection Time: 09/21/21  8:27 AM   Specimen: BLOOD LEFT HAND  Result Value Ref Range Status   Specimen Description BLOOD LEFT HAND  Final   Special Requests   Final    BOTTLES DRAWN AEROBIC AND ANAEROBIC Blood Culture results may not be optimal due to an excessive volume of blood received in culture bottles   Culture   Final    NO GROWTH < 24 HOURS Performed at [facility], [address], [place], [place]    Report Status PENDING  Incomplete  Culture, blood (Routine X 2) w Reflex to ID Panel     Status: None (Preliminary result)   Collection Time: 09/21/21  8:27 AM   Specimen: BLOOD RIGHT ARM  Result Value Ref Range Status   Specimen Description BLOOD RIGHT ARM  Final  Special Requests   Final    BOTTLES DRAWN AEROBIC AND ANAEROBIC Blood Culture adequate volume   Culture   Final    NO GROWTH < 24 HOURS Performed at [facility], [address], [place], [place] [address]    Report Status PENDING  Incomplete      Radiology Studies: No results found.   Scheduled Meds:  chlordiazePOXIDE  25 mg Oral BID   docusate sodium  100 mg Oral BID   folic acid  1 mg Oral Daily   heparin  5,000 Units Subcutaneous Q8H   loratadine  10 mg Oral Daily   multivitamin with minerals  1 tablet Oral Daily   pantoprazole  40 mg Oral Q0600   thiamine  100 mg Oral Daily   Or   thiamine  100 mg Intravenous Daily   Continuous Infusions:  dextrose 5 % and 0.45 % NaCl with KCl 20 mEq/L 150 mL/hr at 09/22/21 1435     LOS: 3 days    Joycelyn Das, MD [facility] If 7PM-7AM, please contact night-coverage 09/22/2021, 5:47 PM

## 2021-09-23 DIAGNOSIS — N179 Acute kidney failure, unspecified: Secondary | ICD-10-CM | POA: Diagnosis not present

## 2021-09-23 DIAGNOSIS — F1010 Alcohol abuse, uncomplicated: Secondary | ICD-10-CM | POA: Diagnosis not present

## 2021-09-23 DIAGNOSIS — R7989 Other specified abnormal findings of blood chemistry: Secondary | ICD-10-CM | POA: Diagnosis not present

## 2021-09-23 DIAGNOSIS — K852 Alcohol induced acute pancreatitis without necrosis or infection: Secondary | ICD-10-CM | POA: Diagnosis not present

## 2021-09-23 LAB — CBC
HCT: 37.1 % — ABNORMAL LOW (ref 39.0–52.0)
Hemoglobin: 13.7 g/dL (ref 13.0–17.0)
MCH: 34.5 pg — ABNORMAL HIGH (ref 26.0–34.0)
MCHC: 36.9 g/dL — ABNORMAL HIGH (ref 30.0–36.0)
MCV: 93.5 fL (ref 80.0–100.0)
Platelets: 138 10*3/uL — ABNORMAL LOW (ref 150–400)
RBC: 3.97 MIL/uL — ABNORMAL LOW (ref 4.22–5.81)
RDW: 12.2 % (ref 11.5–15.5)
WBC: 11 10*3/uL — ABNORMAL HIGH (ref 4.0–10.5)
nRBC: 0 % (ref 0.0–0.2)

## 2021-09-23 LAB — COMPREHENSIVE METABOLIC PANEL
ALT: 42 U/L (ref 0–44)
AST: 25 U/L (ref 15–41)
Albumin: 2.4 g/dL — ABNORMAL LOW (ref 3.5–5.0)
Alkaline Phosphatase: 54 U/L (ref 38–126)
Anion gap: 5 (ref 5–15)
BUN: 10 mg/dL (ref 6–20)
CO2: 21 mmol/L — ABNORMAL LOW (ref 22–32)
Calcium: 7.6 mg/dL — ABNORMAL LOW (ref 8.9–10.3)
Chloride: 107 mmol/L (ref 98–111)
Creatinine, Ser: 1.08 mg/dL (ref 0.61–1.24)
GFR, Estimated: >60 mL/min (ref >60)
Glucose, Bld: 154 mg/dL — ABNORMAL HIGH (ref 70–99)
Potassium: 3.1 mmol/L — ABNORMAL LOW (ref 3.5–5.1)
Sodium: 133 mmol/L — ABNORMAL LOW (ref 135–145)
Total Bilirubin: 1.2 mg/dL (ref 0.3–1.2)
Total Protein: 5.7 g/dL — ABNORMAL LOW (ref 6.5–8.1)

## 2021-09-23 LAB — MAGNESIUM: Magnesium: 2.2 mg/dL (ref 1.7–2.4)

## 2021-09-23 NOTE — Progress Notes (Signed)
   09/23/21 0940  Assess: MEWS Score  Temp 97.8 F (36.6 C)  BP 133/89  MAP (mmHg) 100  Pulse Rate 95  Resp 17  Level of Consciousness Alert  SpO2 95 %  O2 Device Room Air  Assess: MEWS Score  MEWS Temp 0  MEWS Systolic 0  MEWS Pulse 0  MEWS RR 0  MEWS LOC 0  MEWS Score 0  MEWS Score Color Green  Assess: if the MEWS score is Yellow or Red  Were vital signs taken at a resting state? Yes  Focused Assessment Change from prior assessment (see assessment flowsheet)  Does the patient meet 2 or more of the SIRS criteria? No  MEWS guidelines implemented *See Row Information* Yes  Treat  MEWS Interventions Administered prn meds/treatments;Administered scheduled meds/treatments  Pain Scale 0-10  Pain Score 5  Pain Type Acute pain  Pain Location Abdomen  Patients Stated Pain Goal 3  Pain Intervention(s) Repositioned  Document  Patient Outcome Stabilized after interventions  Assess: SIRS CRITERIA  SIRS Temperature  0  SIRS Pulse 1  SIRS Respirations  0  SIRS WBC 1  SIRS Score Sum  2

## 2021-09-23 NOTE — Progress Notes (Signed)
PROGRESS NOTE    Eric Hodge  DUK:[id number] DOB: May 26, 1985 DOA: 09/19/2021 PCP: Donita Brooks, MD   Brief Narrative:  36 year old male with past medical history of ADHD, obesity, alcohol abuse presented to hospital with abdominal pain.  Patient stated that he drinks at least 10 alcoholic drinks daily.  Lipase was 66 on presentation.  CT scan of the abdomen showed peripancreatic inflammation.  MRCP showed pseudocyst pancreatic edema and question of necrosis.  He did have some leukocytosis and low-grade fevers so GI was consulted.  At this time, patient is afebrile, has reactive leukocytosis and GI recommends de-escalation of antibiotic.    Patient still complains of abdominal pain but had lost IV access yesterday.  Tolerating clears.  Assessment & Plan:  Principal Problem:   Alcoholic pancreatitis Active Problems:   Elevated LFTs   Smoker   Alcohol abuse   Hypokalemia   Hypomagnesemia   AKI (acute kidney injury) (HCC)   Obesity, Class III, BMI 40-49.9 (morbid obesity) (HCC)  Alcoholic pancreatitis, complicated Nonalcoholic.  Abnormal MRCP with pseudocyst/peripancreatic edema.  GI on board.  Afebrile today but has leukocytosis could be secondary to pancreatitis related.  Continue IV fluids.  Elevated LDL, triglycerides 297.  Blood cultures negative in less than 2 days.  De-escalate antibiotics today.  Elevated LFTs Likely due to alcohol.  Monitor LFTs.  Hepatitis C antibody negative HIV negative..  Acute kidney injury Receiving IV fluids.  Latest creatinine of 1.1.  Hypomagnesemia Improved with replacement.  Latest magnesium of 1.8.  We will give 1 dose of magnesium sulfate 2 g today.  Hyperlipidemia Statins on hold.  Latest ALT of 51.  Hypokalemia.  We will continue to replenish with IV fluids.  Check levels in AM.  Alcohol abuse On Librium multivitamin folic acid and thiamine.  Watch closely for alcohol withdrawal.  Currently stable.  We will discontinue Librium at  this time.  Smoker Declined nicotine patch.  Morbid obesity. Body mass index is 41.64 kg/m.  Present on admission.  Would benefit from weight loss as outpatient.   DVT prophylaxis: heparin injection 5,000 Units Start: 09/20/21 1830 SCDs Start: 09/20/21 1545  Code Status: Full Code  Family Communication: None at bedside  Status is: Inpatient, pending clinical improvement, leukocytosis, on clears, electrolyte imbalances.  Subjective: Today, patient was seen and examined at bedside.  Still complains of abdominal pain and could not get IV medicine yesterday.  Denies any nausea vomiting fever or chills.  Still has bloating sensation.  He wishes to be on clears.  Discussed about advancing to full liquids if he feels comfortable.  Physical examination:  Body mass index is 41.64 kg/m.  General:  obese built, not in obvious distress HENT:   No scleral pallor or icterus noted. Oral mucosa is moist.  Chest:  Clear breath sounds.  Diminished breath sounds bilaterally. No crackles or wheezes.  CVS: S1 &S2 heard. No murmur.  Mildly tachycardic..  Regular rate and rhythm. Abdomen: Soft, mild epigastric tenderness.  Nondistended.  Bowel sounds are heard.   Extremities: No cyanosis, clubbing or edema.  Peripheral pulses are palpable. Psych: Alert, awake and oriented, normal mood CNS:  No cranial nerve deficits.  Power equal in all extremities.   Skin: Warm and dry.  No rashes noted.   Objective: Vitals:   09/23/21 0552 09/23/21 0900 09/23/21 0924 09/23/21 0940  BP: (!) 155/102 137/82  133/89  Pulse: (!) 117 (!) 113 (!) 133 95  Resp: 18 (!) 22  17  Temp: 98.6 F (  37 C) 97.8 F (36.6 C)  97.8 F (36.6 C)  TempSrc:  Oral  Oral  SpO2: 96% 95%  95%  Weight:      Height:        Intake/Output Summary (Last 24 hours) at 09/23/2021 1203 Last data filed at 09/23/2021 0600 Gross per 24 hour  Intake 3200.1 ml  Output 1075 ml  Net 2125.1 ml    Filed Weights   09/19/21 1650 09/20/21 1606   Weight: 127 kg 127.9 kg    Data Reviewed: I have reviewed the following labs and imaging studies.  CBC: Recent Labs  Lab 09/19/21 1703 09/20/21 1620 09/21/21 0443  WBC 10.7* 13.9* 17.6*  NEUTROABS  --  12.7*  --   HGB 17.4* 17.9* 17.7*  HCT 47.0 49.6 48.1  MCV 93.3 94.3 93.8  PLT 203 186 183    Basic Metabolic Panel: Recent Labs  Lab 09/19/21 1703 09/20/21 1620 09/21/21 0226 09/22/21 0803  NA 141 135 137 137  K 3.9 3.4* 4.3 3.0*  CL 109 106 103 108  CO2 18* 19* 24 20*  GLUCOSE 155* 159* 148* 130*  BUN 10 8 10 11   CREATININE 0.99 0.99 1.30* 1.18  CALCIUM 9.9 8.6* 8.8* 8.0*  MG  --   --  1.4* 1.8    GFR: Estimated Creatinine Clearance: 116.8 mL/min (by C-G formula based on SCr of 1.18 mg/dL). Liver Function Tests: Recent Labs  Lab 09/19/21 1703 09/20/21 1620 09/21/21 0226 09/22/21 0803  AST 120* 66* 60* 25  ALT 211* 145* 115* 51*  ALKPHOS 79 70 64 50  BILITOT 0.9 1.3* 2.3* 1.6*  PROT 7.3 6.7 6.4* 5.5*  ALBUMIN 4.4 3.8 3.4* 2.4*    Recent Labs  Lab 09/19/21 1703 09/20/21 1620 09/21/21 0827 09/22/21 0803  LIPASE 66* 395* 173* 65*    No results for input(s): "AMMONIA" in the last 168 hours. Coagulation Profile: No results for input(s): "INR", "PROTIME" in the last 168 hours. Cardiac Enzymes: No results for input(s): "CKTOTAL", "CKMB", "CKMBINDEX", "TROPONINI" in the last 168 hours. BNP (last 3 results) No results for input(s): "PROBNP" in the last 8760 hours. HbA1C: Recent Labs    09/20/21 1620  HGBA1C 4.9    CBG: Recent Labs  Lab 09/22/21 0102 09/22/21 0612 09/22/21 1147 09/22/21 1653 09/22/21 2356  GLUCAP 168* 135* 153* 163* 150*    Lipid Profile: No results for input(s): "CHOL", "HDL", "LDLCALC", "TRIG", "CHOLHDL", "LDLDIRECT" in the last 72 hours.  Thyroid Function Tests: No results for input(s): "TSH", "T4TOTAL", "FREET4", "T3FREE", "THYROIDAB" in the last 72 hours. Anemia Panel: No results for input(s): "VITAMINB12",  "FOLATE", "FERRITIN", "TIBC", "IRON", "RETICCTPCT" in the last 72 hours. Sepsis Labs: Recent Labs  Lab 09/21/21 0443  LATICACIDVEN 1.3     Recent Results (from the past 240 hour(s))  Culture, blood (Routine X 2) w Reflex to ID Panel     Status: None (Preliminary result)   Collection Time: 09/21/21  8:27 AM   Specimen: BLOOD LEFT HAND  Result Value Ref Range Status   Specimen Description BLOOD LEFT HAND  Final   Special Requests   Final    BOTTLES DRAWN AEROBIC AND ANAEROBIC Blood Culture results may not be optimal due to an excessive volume of blood received in culture bottles   Culture   Final    NO GROWTH 2 DAYS Performed at [facility], [address], [place], [place]    Report Status PENDING  Incomplete  Culture, blood (Routine  X 2) w Reflex to ID Panel     Status: None (Preliminary result)   Collection Time: 09/21/21  8:27 AM   Specimen: BLOOD RIGHT ARM  Result Value Ref Range Status   Specimen Description BLOOD RIGHT ARM  Final   Special Requests   Final    BOTTLES DRAWN AEROBIC AND ANAEROBIC Blood Culture adequate volume   Culture   Final    NO GROWTH 2 DAYS Performed at [facility], [address], [place], [place] [address]    Report Status PENDING  Incomplete      Radiology Studies: No results found.   Scheduled Meds:  chlordiazePOXIDE  25 mg Oral BID   docusate sodium  100 mg Oral BID   folic acid  1 mg Oral Daily   heparin  5,000 Units Subcutaneous Q8H   loratadine  10 mg Oral Daily   multivitamin with minerals  1 tablet Oral Daily   pantoprazole  40 mg Oral Q0600   thiamine  100 mg Oral Daily   Or   thiamine  100 mg Intravenous Daily   Continuous Infusions:  dextrose 5 % and 0.45 % NaCl with KCl 20 mEq/L 150 mL/hr at 09/23/21 0357     LOS: 4 days    Joycelyn Das, MD [facility] If 7PM-7AM, please contact night-coverage 09/23/2021, 12:03 PM

## 2021-09-23 NOTE — Progress Notes (Signed)
   09/23/21 1400  Assess: MEWS Score  Temp (!) 97.2 F (36.2 C)  BP (!) 146/89  Pulse Rate (!) 102  Resp (!) 22  Level of Consciousness Alert  SpO2 96 %  O2 Device Room Air  Assess: MEWS Score  MEWS Temp 0  MEWS Systolic 0  MEWS Pulse 1  MEWS RR 1  MEWS LOC 0  MEWS Score 2  MEWS Score Color Yellow  Assess: if the MEWS score is Yellow or Red  Were vital signs taken at a resting state? Yes  Focused Assessment No change from prior assessment  Does the patient meet 2 or more of the SIRS criteria? No  Does the patient have a confirmed or suspected source of infection? No  MEWS guidelines implemented *See Row Information* No, vital signs rechecked  Treat  MEWS Interventions Other (Comment) (assessed pt,)  Pain Scale 0-10  Pain Score 2  Pain Type Chronic pain  Pain Location Abdomen  Patients Stated Pain Goal 0  Document  Patient Outcome Stabilized after interventions  Assess: SIRS CRITERIA  SIRS Temperature  0  SIRS Pulse 1  SIRS Respirations  1  SIRS WBC 1  SIRS Score Sum  3

## 2021-09-24 DIAGNOSIS — R7989 Other specified abnormal findings of blood chemistry: Secondary | ICD-10-CM | POA: Diagnosis not present

## 2021-09-24 DIAGNOSIS — K852 Alcohol induced acute pancreatitis without necrosis or infection: Secondary | ICD-10-CM | POA: Diagnosis not present

## 2021-09-24 DIAGNOSIS — F1010 Alcohol abuse, uncomplicated: Secondary | ICD-10-CM | POA: Diagnosis not present

## 2021-09-24 DIAGNOSIS — N179 Acute kidney failure, unspecified: Secondary | ICD-10-CM | POA: Diagnosis not present

## 2021-09-24 LAB — COMPREHENSIVE METABOLIC PANEL
ALT: 38 U/L (ref 0–44)
AST: 25 U/L (ref 15–41)
Albumin: 2.3 g/dL — ABNORMAL LOW (ref 3.5–5.0)
Alkaline Phosphatase: 62 U/L (ref 38–126)
Anion gap: 6 (ref 5–15)
BUN: 7 mg/dL (ref 6–20)
CO2: 22 mmol/L (ref 22–32)
Calcium: 7.9 mg/dL — ABNORMAL LOW (ref 8.9–10.3)
Chloride: 108 mmol/L (ref 98–111)
Creatinine, Ser: 0.98 mg/dL (ref 0.61–1.24)
GFR, Estimated: >60 mL/min (ref >60)
Glucose, Bld: 191 mg/dL — ABNORMAL HIGH (ref 70–99)
Potassium: 3.3 mmol/L — ABNORMAL LOW (ref 3.5–5.1)
Sodium: 136 mmol/L (ref 135–145)
Total Bilirubin: 0.8 mg/dL (ref 0.3–1.2)
Total Protein: 5.6 g/dL — ABNORMAL LOW (ref 6.5–8.1)

## 2021-09-24 LAB — GLUCOSE, CAPILLARY
Glucose-Capillary: 172 mg/dL — ABNORMAL HIGH (ref 70–99)
Glucose-Capillary: 179 mg/dL — ABNORMAL HIGH (ref 70–99)

## 2021-09-24 LAB — MAGNESIUM: Magnesium: 2.3 mg/dL (ref 1.7–2.4)

## 2021-09-24 MED ORDER — POTASSIUM CHLORIDE CRYS ER 20 MEQ PO TBCR
40.0000 meq | EXTENDED_RELEASE_TABLET | Freq: Once | ORAL | Status: AC
  Administered 2021-09-24: 40 meq via ORAL
  Filled 2021-09-24: qty 2

## 2021-09-24 MED ORDER — HYDROMORPHONE HCL 1 MG/ML IJ SOLN
0.5000 mg | INTRAMUSCULAR | Status: DC | PRN
  Administered 2021-09-24 – 2021-09-25 (×3): 0.5 mg via INTRAVENOUS
  Filled 2021-09-24 (×3): qty 1

## 2021-09-24 NOTE — Progress Notes (Signed)
PROGRESS NOTE    Eric Hodge  VZC:[id number] DOB: April 03, 1985 DOA: 09/19/2021 PCP: Donita Brooks, MD   Brief Narrative:  36 year old male with past medical history of ADHD, obesity, alcohol abuse presented to hospital with abdominal pain.  Patient stated that he drinks at least 10 alcoholic drinks daily.  Lipase was 66 on presentation.  CT scan of the abdomen showed peripancreatic inflammation.  MRCP showed pseudocyst pancreatic edema and question of necrosis.  He did have some leukocytosis and low-grade fevers so GI was consulted.  At this time, patient is afebrile, has reactive leukocytosis and GI recommends de-escalation of antibiotic.  Patient had a slow response to conservative treatment in the hospital.   Assessment & Plan:  Principal Problem:   Alcoholic pancreatitis Active Problems:   Elevated LFTs   Smoker   Alcohol abuse   Hypokalemia   Hypomagnesemia   AKI (acute kidney injury) (HCC)   Obesity, Class III, BMI 40-49.9 (morbid obesity) (HCC)  Alcoholic pancreatitis, complicated Nonalcoholic.  Abnormal MRCP with pseudocyst/peripancreatic edema.  GI was consulted.  Afebrile today but has leukocytosis could be secondary to pancreatitis related.  Received IV fluids.  Elevated LDL, triglycerides 297.  Blood cultures negative in less than 2 days.  Antibiotic.  Patient was advised to soft diet today but had increased pain so we will downgrade to full liquids again.  We will try soft diet again in AM.  Encouraged oral hydration.  We will discontinue IV fluids.  Elevated LFTs Likely due to alcohol.  Monitor LFTs.  Hepatitis C antibody negative, HIV negative..  Acute kidney injury Receiving IV fluids.  Latest creatinine of 0.9.  Hypomagnesemia Improved with replacement.  Latest magnesium of 2.3.  Hypokalemia.  Will replace with oral potassium.  Check BMP in AM.  Hyperlipidemia Statins on hold.  Latest ALT of 51.  Hypokalemia.  We will continue to replenish with IV fluids.   Check levels in AM.  Alcohol abuse On multivitamin folic acid and thiamine.  Watch closely for alcohol withdrawal.  Currently stable.  Add as needed Ativan.  Smoker Declined nicotine patch.  Morbid obesity. Body mass index is 41.64 kg/m.  Present on admission.  Would benefit from weight loss as outpatient.   DVT prophylaxis: heparin injection 5,000 Units Start: 09/20/21 1830 SCDs Start: 09/20/21 1545  Code Status: Full Code  Family Communication: None at bedside  Status is: Inpatient, pending clinical improvement, hypokalemia.  Subjective: Today, patient was seen and examined at bedside.  Has tolerated full liquids and wanted to try soft diet but had increased pain after that.  Denies nausea or vomiting.  Physical examination:  Body mass index is 41.64 kg/m.  General: Obese built, not in obvious distress HENT:   No scleral pallor or icterus noted. Oral mucosa is moist.  Chest:  Clear breath sounds.  Diminished breath sounds bilaterally. No crackles or wheezes.  CVS: S1 &S2 heard. No murmur.  Regular rate and rhythm. Abdomen: Soft, mild epigastric tenderness on deep palpation nondistended.  Bowel sounds are heard.   Extremities: No cyanosis, clubbing or edema.  Peripheral pulses are palpable. Psych: Alert, awake and oriented, normal mood CNS:  No cranial nerve deficits.  Power equal in all extremities.   Skin: Warm and dry.  No rashes noted.   Objective: Vitals:   09/23/21 2144 09/24/21 0431 09/24/21 0431 09/24/21 0925  BP: (!) 142/78 129/78 129/78 (!) 130/114  Pulse: (!) 110 (!) 103 (!) 105 (!) 110  Resp: 16 16 16 20   Temp:  98.3 F (36.8 C) (!) 97.3 F (36.3 C) (!) 97.3 F (36.3 C) 98.6 F (37 C)  TempSrc: Oral Oral Oral Oral  SpO2: 94% 96% 95% 98%  Weight:      Height:        Intake/Output Summary (Last 24 hours) at 09/24/2021 1342 Last data filed at 09/24/2021 9024 Gross per 24 hour  Intake 3678.44 ml  Output 1200 ml  Net 2478.44 ml    Filed Weights    09/19/21 1650 09/20/21 1606  Weight: 127 kg 127.9 kg    Data Reviewed: I have reviewed the following labs and imaging studies.  CBC: Recent Labs  Lab 09/19/21 1703 09/20/21 1620 09/21/21 0443 09/23/21 1130  WBC 10.7* 13.9* 17.6* 11.0*  NEUTROABS  --  12.7*  --   --   HGB 17.4* 17.9* 17.7* 13.7  HCT 47.0 49.6 48.1 37.1*  MCV 93.3 94.3 93.8 93.5  PLT 203 186 183 138*    Basic Metabolic Panel: Recent Labs  Lab 09/20/21 1620 09/21/21 0226 09/22/21 0803 09/23/21 1130 09/24/21 1121  NA 135 137 137 133* 136  K 3.4* 4.3 3.0* 3.1* 3.3*  CL 106 103 108 107 108  CO2 19* 24 20* 21* 22  GLUCOSE 159* 148* 130* 154* 191*  BUN 8 10 11 10 7   CREATININE 0.99 1.30* 1.18 1.08 0.98  CALCIUM 8.6* 8.8* 8.0* 7.6* 7.9*  MG  --  1.4* 1.8 2.2 2.3    GFR: Estimated Creatinine Clearance: 140.6 mL/min (by C-G formula based on SCr of 0.98 mg/dL). Liver Function Tests: Recent Labs  Lab 09/20/21 1620 09/21/21 0226 09/22/21 0803 09/23/21 1130 09/24/21 1121  AST 66* 60* 25 25 25   ALT 145* 115* 51* 42 38  ALKPHOS 70 64 50 54 62  BILITOT 1.3* 2.3* 1.6* 1.2 0.8  PROT 6.7 6.4* 5.5* 5.7* 5.6*  ALBUMIN 3.8 3.4* 2.4* 2.4* 2.3*    Recent Labs  Lab 09/19/21 1703 09/20/21 1620 09/21/21 0827 09/22/21 0803  LIPASE 66* 395* 173* 65*    No results for input(s): "AMMONIA" in the last 168 hours. Coagulation Profile: No results for input(s): "INR", "PROTIME" in the last 168 hours. Cardiac Enzymes: No results for input(s): "CKTOTAL", "CKMB", "CKMBINDEX", "TROPONINI" in the last 168 hours. BNP (last 3 results) No results for input(s): "PROBNP" in the last 8760 hours. HbA1C: No results for input(s): "HGBA1C" in the last 72 hours.  CBG: Recent Labs  Lab 09/22/21 1147 09/22/21 1653 09/22/21 2356 09/24/21 0040 09/24/21 0526  GLUCAP 153* 163* 150* 172* 179*    Lipid Profile: No results for input(s): "CHOL", "HDL", "LDLCALC", "TRIG", "CHOLHDL", "LDLDIRECT" in the last 72  hours.  Thyroid Function Tests: No results for input(s): "TSH", "T4TOTAL", "FREET4", "T3FREE", "THYROIDAB" in the last 72 hours. Anemia Panel: No results for input(s): "VITAMINB12", "FOLATE", "FERRITIN", "TIBC", "IRON", "RETICCTPCT" in the last 72 hours. Sepsis Labs: Recent Labs  Lab 09/21/21 0443  LATICACIDVEN 1.3     Recent Results (from the past 240 hour(s))  Culture, blood (Routine X 2) w Reflex to ID Panel     Status: None (Preliminary result)   Collection Time: 09/21/21  8:27 AM   Specimen: BLOOD LEFT HAND  Result Value Ref Range Status   Specimen Description BLOOD LEFT HAND  Final   Special Requests   Final    BOTTLES DRAWN AEROBIC AND ANAEROBIC Blood Culture results may not be optimal due to an excessive volume of blood received in culture bottles   Culture   Final  NO GROWTH 3 DAYS Performed at [facility], [address] [place], [place] [address]    Report Status PENDING  Incomplete  Culture, blood (Routine X 2) w Reflex to ID Panel     Status: None (Preliminary result)   Collection Time: 09/21/21  8:27 AM   Specimen: BLOOD RIGHT ARM  Result Value Ref Range Status   Specimen Description BLOOD RIGHT ARM  Final   Special Requests   Final    BOTTLES DRAWN AEROBIC AND ANAEROBIC Blood Culture adequate volume   Culture   Final    NO GROWTH 3 DAYS Performed at [facility], [address], [place], [place] [address]    Report Status PENDING  Incomplete      Radiology Studies: No results found.   Scheduled Meds:  docusate sodium  100 mg Oral BID   folic acid  1 mg Oral Daily   heparin  5,000 Units Subcutaneous Q8H   loratadine  10 mg Oral Daily   multivitamin with minerals  1 tablet Oral Daily   pantoprazole  40 mg Oral Q0600   thiamine  100 mg Oral Daily   Or   thiamine  100 mg Intravenous Daily   Continuous Infusions:  dextrose 5 % and 0.45 % NaCl with KCl 20 mEq/L 150 mL/hr at 09/24/21 1118     LOS: 5 days    Joycelyn Das,  MD [facility] If 7PM-7AM, please contact night-coverage 09/24/2021, 1:42 PM

## 2021-09-25 DIAGNOSIS — F1010 Alcohol abuse, uncomplicated: Secondary | ICD-10-CM | POA: Diagnosis not present

## 2021-09-25 DIAGNOSIS — N179 Acute kidney failure, unspecified: Secondary | ICD-10-CM | POA: Diagnosis not present

## 2021-09-25 DIAGNOSIS — R7989 Other specified abnormal findings of blood chemistry: Secondary | ICD-10-CM | POA: Diagnosis not present

## 2021-09-25 DIAGNOSIS — K852 Alcohol induced acute pancreatitis without necrosis or infection: Secondary | ICD-10-CM | POA: Diagnosis not present

## 2021-09-25 LAB — GLUCOSE, CAPILLARY
Glucose-Capillary: 138 mg/dL — ABNORMAL HIGH (ref 70–99)
Glucose-Capillary: 152 mg/dL — ABNORMAL HIGH (ref 70–99)

## 2021-09-25 MED ORDER — SIMETHICONE 80 MG PO CHEW: 80.0000 mg | CHEWABLE_TABLET | Freq: Four times a day (QID) | ORAL | 0 refills | Status: DC | PRN

## 2021-09-25 MED ORDER — FOLIC ACID 1 MG PO TABS: 1.0000 mg | ORAL_TABLET | Freq: Every day | ORAL | 0 refills | Status: DC

## 2021-09-25 MED ORDER — ADULT MULTIVITAMIN W/MINERALS CH
1.0000 | ORAL_TABLET | Freq: Every day | ORAL | 0 refills | Status: AC
Start: 2021-09-25 — End: 2022-01-03

## 2021-09-25 MED ORDER — SIMETHICONE 80 MG PO CHEW
80.0000 mg | CHEWABLE_TABLET | Freq: Four times a day (QID) | ORAL | Status: DC | PRN
  Administered 2021-09-25: 80 mg via ORAL
  Filled 2021-09-25: qty 1

## 2021-09-25 MED ORDER — VITAMIN B-1 100 MG PO TABS: 100.0000 mg | ORAL_TABLET | Freq: Every day | ORAL | 0 refills | Status: DC

## 2021-09-25 MED ORDER — POLYETHYLENE GLYCOL 3350 17 G PO PACK: 17.0000 g | PACK | Freq: Every day | ORAL | 0 refills | Status: AC | PRN

## 2021-09-25 NOTE — Progress Notes (Signed)
Patient given breakfast and a full head to toe assessment. All fields within normal limits. Patient's tele discontinued and IV removed. Discharge paperwork printed, now waiting on patients ride to arrive.

## 2021-09-25 NOTE — Discharge Summary (Signed)
Physician Discharge Summary  Eric Hodge [id number]:[id number] DOB: 05/17/1985 DOA: 09/19/2021  PCP: Donita Brooks, MD  Admit date: 09/19/2021 Discharge date: 09/25/2021  Admitted From: Home  Discharge disposition: Home   Recommendations for Outpatient Follow-Up:   Follow up with your primary care provider in one week. Check CBC, BMP, LFT, magnesium in the next visit Patient should be encouraged to quit alcohol.  Discharge Diagnosis:   Principal Problem:   Alcoholic pancreatitis Active Problems:   Elevated LFTs   Smoker   Alcohol abuse   Hypokalemia   Hypomagnesemia   AKI (acute kidney injury) (HCC)   Obesity, Class III, BMI 40-49.9 (morbid obesity) (HCC)  Discharge Condition: Improved.  Diet recommendation: Low sodium, heart healthy.   Wound care: None.  Code status: Full.   History of Present Illness:   36 year old male with past medical history of ADHD, obesity, alcohol abuse presented to hospital with abdominal pain.  Patient stated that he drinks at least 10 alcoholic drinks daily.  Lipase was 66 on presentation.  CT scan of the abdomen showed peripancreatic inflammation.  MRCP showed pseudocyst pancreatic edema and question of necrosis.  He did have some leukocytosis and low-grade fevers so GI was consulted.  At this time, patient is afebrile, has reactive leukocytosis and GI recommends de-escalation of antibiotic.  Patient had a slow response to conservative treatment in the hospital.   Hospital Course:   Following conditions were addressed during hospitalization as listed below,  Alcoholic pancreatitis, complicated Nonalcoholic.  Abnormal MRCP with pseudocyst/peripancreatic edema.  GI was consulted.  Was treated conservatively with gradual improvement..  Elevated LDL, triglycerides 297.  Had leukocytosis and low-grade fever so blood cultures was done which was negative.  Initially antibiotics was initiated which was discontinued.  Patient clinically improved.     Elevated LFTs Likely due to alcohol.   Hepatitis C antibody negative, HIV negative. Advised cessation of alcohol.  Resume statins on discharge.  LFTs are improved.   Acute kidney injury Resolved after received IV fluids.  Latest creatinine of 0.9.   Hypomagnesemia Improved with replacement.  Latest magnesium of 2.3.   Hypokalemia.  Replenished orally aggressively during hospitalization.     Hyperlipidemia Resume statin on discharge.   Alcohol abuse Continue multivitamin folic acid and thiamine on discharge..  Advised against alcohol.  Smoker Declined nicotine patch.   Morbid obesity. Body mass index is 41.64 kg/m.  Present on admission.  Would benefit from weight loss as outpatient.   Disposition.  At this time, patient is stable for disposition home with outpatient PCP follow-up.  Medical Consultants:   GI  Procedures:    None Subjective:   Today, patient was seen and examined at bedside.  Denies any nausea vomiting fever chills or rigor.  Has tolerated oral diet.  Discharge Exam:   Vitals:   09/24/21 2121 09/25/21 0606  BP: 139/73 (!) 154/100  Pulse: (!) 104 99  Resp: 19   Temp: 98.4 F (36.9 C) 98.5 F (36.9 C)  SpO2: 98% 99%   Vitals:   09/24/21 0925 09/24/21 1657 09/24/21 2121 09/25/21 0606  BP: (!) 130/114 (!) 163/97 139/73 (!) 154/100  Pulse: (!) 110 (!) 105 (!) 104 99  Resp: 20 20 19    Temp: 98.6 F (37 C) 98.5 F (36.9 C) 98.4 F (36.9 C) 98.5 F (36.9 C)  TempSrc: Oral Oral    SpO2: 98% 99% 98% 99%  Weight:      Height:       Body mass  index is 41.64 kg/m.  General: Alert awake, not in obvious distress, obese built HENT: pupils equally reacting to light,  No scleral pallor or icterus noted. Oral mucosa is moist.  Chest:  Clear breath sounds.  Diminished breath sounds bilaterally. No crackles or wheezes.  CVS: S1 &S2 heard. No murmur.  Regular rate and rhythm. Abdomen: Soft, nontender, nondistended.  Bowel sounds are heard.    Extremities: No cyanosis, clubbing or edema.  Peripheral pulses are palpable. Psych: Alert, awake and oriented, normal mood CNS:  No cranial nerve deficits.  Power equal in all extremities.   Skin: Warm and dry.  No rashes noted.  The results of significant diagnostics from this hospitalization (including imaging, microbiology, ancillary and laboratory) are listed below for reference.     Diagnostic Studies:   MR ABDOMEN MRCP W WO CONTAST  Result Date: 09/20/2021 CLINICAL DATA:  Acute pancreatitis. EXAM: MRI ABDOMEN WITHOUT AND WITH CONTRAST (INCLUDING MRCP) TECHNIQUE: Multiplanar multisequence MR imaging of the abdomen was performed both before and after the administration of intravenous contrast. Heavily T2-weighted images of the biliary and pancreatic ducts were obtained, and three-dimensional MRCP images were rendered by post processing. CONTRAST:  65mL GADAVIST GADOBUTROL 1 MMOL/ML IV SOLN COMPARISON:  CT scan 09/19/2021 FINDINGS: Lower chest: Dependent atelectasis bilaterally. Hepatobiliary: Liver is enlarged at 23.8 cm craniocaudal length. Diffuse loss of signal intensity in the liver parenchyma on out of phase T1 imaging is compatible with fatty deposition. Postcontrast imaging is markedly motion degraded. Within this limitation there is no gross enhancing mass lesion within the liver. Small or subtle lesions could be obscured. There is no evidence for gallstones, gallbladder wall thickening, or pericholecystic fluid. No intrahepatic or extrahepatic biliary dilation. No choledocholithiasis. Pancreas: Fine detail of pancreatic parenchyma is obscured by breathing motion. Pancreatic parenchyma is diffusely fatty replaced. As noted on CT, pancreatic parenchyma is diffusely edematous with associated peripancreatic/retroperitoneal edema. Imaging features compatible with pancreatitis. Assessment for pancreatic parenchymal enhancement is degraded by the fatty replacement and substantial motion artifact.  Spleen:  No splenomegaly. No focal mass lesion. Adrenals/Urinary Tract: No adrenal nodule or mass. Kidneys unremarkable. Stomach/Bowel: Stomach is unremarkable. No gastric wall thickening. No evidence of outlet obstruction. No small bowel or colonic dilatation within the visualized abdomen. Vascular/Lymphatic: No abdominal aortic aneurysm. No abdominal lymphadenopathy. Other: Retroperitoneal edema noted with edema in the left upper quadrant. 3.8 x 7.8 x 2.7 cm fluid collection is seen adjacent to the gastric antrum (axial 34/5). This collection shows no rim enhancement. Musculoskeletal: No focal suspicious marrow enhancement within the visualized bony anatomy. IMPRESSION: 1. Markedly motion degraded study. 2. Hepatomegaly with hepatic steatosis. 3. Diffuse pancreatic edema with associated peripancreatic/retroperitoneal edema. Imaging features compatible with acute pancreatitis. Assessment for pancreatic necrosis limited by substantial motion artifact on this study and diffuse fatty replacement of pancreatic parenchyma. 4. 3.8 x 7.8 x 2.7 cm fluid collection adjacent to the gastric antrum. No organized or enhancing rim at this time. As such, this may simply reflect a collection of confluent edema. Follow-up could be used to assess for evolution into pseudocyst or abscess as clinically warranted. 5. No evidence for gallstones. No biliary dilatation. No choledocholithiasis. 6. Dependent atelectasis bilaterally. Electronically Signed   By: Kennith Center M.D.   On: 09/20/2021 12:05   CT ABDOMEN PELVIS W CONTRAST  Result Date: 09/19/2021 CLINICAL DATA:  Abdominal pain, acute, nonlocalized EXAM: CT ABDOMEN AND PELVIS WITH CONTRAST TECHNIQUE: Multidetector CT imaging of the abdomen and pelvis was performed using the standard protocol  following bolus administration of intravenous contrast. RADIATION DOSE REDUCTION: This exam was performed according to the departmental dose-optimization program which includes automated  exposure control, adjustment of the mA and/or kV according to patient size and/or use of iterative reconstruction technique. CONTRAST:  100mL OMNIPAQUE IOHEXOL 300 MG/ML  SOLN COMPARISON:  None Available. FINDINGS: Lower chest: No acute abnormality. Hepatobiliary: Marked hepatic steatosis. No enhancing intrahepatic mass. No intra or extrahepatic biliary ductal dilation. Gallbladder unremarkable. Pancreas: The pancreatic parenchyma demonstrates diffuse fatty atrophy. There is superimposed diffuse peripancreatic inflammatory stranding in keeping with superimposed diffuse, acute pancreatitis. Viability of the pancreatic parenchyma is difficult to assess given background fatty atrophy with areas of hypoattenuation representing either fatty infiltration or areas of hypoenhancement representing threatened viability of the pancreatic parenchyma. No peripancreatic fluid collections are identified. Spleen: Mild splenomegaly with the spleen measuring 15.1 cm in greatest dimension. No intrasplenic lesions are seen. Splenic vein is patent. Adrenals/Urinary Tract: Adrenal glands are unremarkable. Kidneys are normal, without renal calculi, focal lesion, or hydronephrosis. Bladder is unremarkable. Stomach/Bowel: Stomach is within normal limits. Appendix appears normal. No evidence of bowel wall thickening, distention, or inflammatory changes. Vascular/Lymphatic: No significant vascular findings are present. No enlarged abdominal or pelvic lymph nodes. Reproductive: Prostate is unremarkable. Other: No abdominal wall hernia or abnormality. No abdominopelvic ascites. Musculoskeletal: No acute or significant osseous findings. IMPRESSION: 1. Acute, diffuse, pancreatitis. Viability of the pancreatic parenchyma is difficult to assess given background fatty atrophy with areas of hypoattenuation representing either fatty infiltration or areas of hypoenhancement and threatened viability of the pancreatic parenchyma. Contrast enhanced MRI  examination would be helpful for further evaluation if indicated. No peripancreatic fluid collections are identified. 2. Marked hepatic steatosis. 3. Mild splenomegaly. Electronically Signed   By: Helyn NumbersAshesh  Parikh M.D.   On: 09/19/2021 20:02     Labs:   Basic Metabolic Panel: Recent Labs  Lab 09/20/21 1620 09/21/21 0226 09/22/21 0803 09/23/21 1130 09/24/21 1121  NA 135 137 137 133* 136  K 3.4* 4.3 3.0* 3.1* 3.3*  CL 106 103 108 107 108  CO2 19* 24 20* 21* 22  GLUCOSE 159* 148* 130* 154* 191*  BUN 8 10 11 10 7   CREATININE 0.99 1.30* 1.18 1.08 0.98  CALCIUM 8.6* 8.8* 8.0* 7.6* 7.9*  MG  --  1.4* 1.8 2.2 2.3   GFR Estimated Creatinine Clearance: 140.6 mL/min (by C-G formula based on SCr of 0.98 mg/dL). Liver Function Tests: Recent Labs  Lab 09/20/21 1620 09/21/21 0226 09/22/21 0803 09/23/21 1130 09/24/21 1121  AST 66* 60* 25 25 25   ALT 145* 115* 51* 42 38  ALKPHOS 70 64 50 54 62  BILITOT 1.3* 2.3* 1.6* 1.2 0.8  PROT 6.7 6.4* 5.5* 5.7* 5.6*  ALBUMIN 3.8 3.4* 2.4* 2.4* 2.3*   Recent Labs  Lab 09/19/21 1703 09/20/21 1620 09/21/21 0827 09/22/21 0803  LIPASE 66* 395* 173* 65*   No results for input(s): "AMMONIA" in the last 168 hours. Coagulation profile No results for input(s): "INR", "PROTIME" in the last 168 hours.  CBC: Recent Labs  Lab 09/19/21 1703 09/20/21 1620 09/21/21 0443 09/23/21 1130  WBC 10.7* 13.9* 17.6* 11.0*  NEUTROABS  --  12.7*  --   --   HGB 17.4* 17.9* 17.7* 13.7  HCT 47.0 49.6 48.1 37.1*  MCV 93.3 94.3 93.8 93.5  PLT 203 186 183 138*   Cardiac Enzymes: No results for input(s): "CKTOTAL", "CKMB", "CKMBINDEX", "TROPONINI" in the last 168 hours. BNP: Invalid input(s): "POCBNP" CBG: Recent Labs  Lab  09/22/21 2356 09/24/21 0040 09/24/21 0526 09/25/21 0039 09/25/21 0606  GLUCAP 150* 172* 179* 138* 152*   D-Dimer No results for input(s): "DDIMER" in the last 72 hours. Hgb A1c No results for input(s): "HGBA1C" in the last 72  hours. Lipid Profile No results for input(s): "CHOL", "HDL", "LDLCALC", "TRIG", "CHOLHDL", "LDLDIRECT" in the last 72 hours. Thyroid function studies No results for input(s): "TSH", "T4TOTAL", "T3FREE", "THYROIDAB" in the last 72 hours.  Invalid input(s): "FREET3" Anemia work up No results for input(s): "VITAMINB12", "FOLATE", "FERRITIN", "TIBC", "IRON", "RETICCTPCT" in the last 72 hours. Microbiology Recent Results (from the past 240 hour(s))  Culture, blood (Routine X 2) w Reflex to ID Panel     Status: None (Preliminary result)   Collection Time: 09/21/21  8:27 AM   Specimen: BLOOD LEFT HAND  Result Value Ref Range Status   Specimen Description BLOOD LEFT HAND  Final   Special Requests   Final    BOTTLES DRAWN AEROBIC AND ANAEROBIC Blood Culture results may not be optimal due to an excessive volume of blood received in culture bottles   Culture   Final    NO GROWTH 4 DAYS Performed at [facility], [address] [place], [place] [address]    Report Status PENDING  Incomplete  Culture, blood (Routine X 2) w Reflex to ID Panel     Status: None (Preliminary result)   Collection Time: 09/21/21  8:27 AM   Specimen: BLOOD RIGHT ARM  Result Value Ref Range Status   Specimen Description BLOOD RIGHT ARM  Final   Special Requests   Final    BOTTLES DRAWN AEROBIC AND ANAEROBIC Blood Culture adequate volume   Culture   Final    NO GROWTH 4 DAYS Performed at [facility], [address], [place], [place] [address]    Report Status PENDING  Incomplete     Discharge Instructions:   Discharge Instructions     Diet - low sodium heart healthy   Complete by: As directed    Discharge instructions   Complete by: As directed    Follow-up with your primary care provider in 1 to 2 weeks.  Try to avoid fatty fried greasy food.  Do not drink alcohol.  Take vitamin supplements as prescribed.  Seek medical attention for worsening symptoms.  Increase fluid intake.   Increase  activity slowly   Complete by: As directed       Allergies as of 09/25/2021   No Known Allergies      Medication List     STOP taking these medications    ketoconazole 2 % shampoo Commonly known as: Nizoral   lisdexamfetamine 50 MG capsule Commonly known as: Vyvanse       TAKE these medications    atorvastatin 20 MG tablet Commonly known as: LIPITOR TAKE 1 TABLET BY MOUTH EVERY NIGHT AT BEDTIME.   azelastine 0.1 % nasal spray Commonly known as: ASTELIN Place 2 sprays into both nostrils 2 (two) times daily. Use in each nostril as directed   Cetirizine HCl 10 MG Caps Commonly known as: ZyrTEC Allergy Take 1 capsule (10 mg total) by mouth daily as needed.   folic acid 1 MG tablet Commonly known as: FOLVITE Take 1 tablet (1 mg total) by mouth daily.   montelukast 10 MG tablet Commonly known as: SINGULAIR TAKE 1 TABLET(10 MG) BY MOUTH DAILY AS NEEDED FOR ALLERGIES   multivitamin with minerals Tabs tablet Take 1 tablet by mouth daily.  omeprazole 20 MG capsule Commonly known as: PRILOSEC TAKE 1 CAPSULE(20 MG) BY MOUTH DAILY   polyethylene glycol 17 g packet Commonly known as: MiraLax Take 17 g by mouth daily as needed for up to 5 days for mild constipation or moderate constipation.   simethicone 80 MG chewable tablet Commonly known as: MYLICON Chew 1 tablet (80 mg total) by mouth every 6 (six) hours as needed for flatulence.   thiamine 100 MG tablet Commonly known as: Vitamin B-1 Take 1 tablet (100 mg total) by mouth daily.   triamcinolone cream 0.1 % Commonly known as: KENALOG Apply 1 application topically 2 (two) times daily.         Time coordinating discharge: 39 minutes  Signed:  Guillermo Nehring  [facility] 09/25/2021, 3:33 PM

## 2021-09-26 LAB — CULTURE, BLOOD (ROUTINE X 2)
Culture: NO GROWTH
Culture: NO GROWTH
Special Requests: ADEQUATE

## 2021-09-28 ENCOUNTER — Telehealth: Payer: Self-pay

## 2021-09-28 NOTE — Telephone Encounter (Signed)
Transition Care Management Follow-up Telephone Call Date of discharge and from where: 09/25/21 from [facility], DX: Alcoholic Pancreatitis How have you been since you were released from the hospital? Per pt still having pain, mostly on the back. Pain rate 4/5 Any questions or concerns? No  Items Reviewed: Did the pt receive and understand the discharge instructions provided? Yes  Medications obtained and verified? Yes  Other? No  Any new allergies since your discharge? No  Dietary orders reviewed? Yes Do you have support at home? Yes, his 41 year son  Home Care and Equipment/Supplies: Were home health services ordered? no If so, what is the name of the agency? N/a  Has the agency set up a time to come to the patient's home? not applicable Were any new equipment or medical supplies ordered?  No What is the name of the medical supply agency? N/a Were you able to get the supplies/equipment? not applicable Do you have any questions related to the use of the equipment or supplies? No  Functional Questionnaire: (I = Independent and D = Dependent) ADLs: I  Bathing/Dressing- I  Meal Prep- I  Eating- I  Maintaining continence- I  Transferring/Ambulation- I  Managing Meds- I  Follow up appointments reviewed:  PCP Hospital f/u appt confirmed? Yes  Scheduled to see Tuesday on 10/05/21 @ 12pm. [facility] f/u appt confirmed? No  Scheduled to Are transportation arrangements needed? No  If their condition worsens, is the pt aware to call PCP or go to the Emergency Dept.? Yes Was the patient provided with contact information for the PCP's office or ED? Yes Was to pt encouraged to call back with questions or concerns? Yes

## 2021-10-05 ENCOUNTER — Ambulatory Visit (INDEPENDENT_AMBULATORY_CARE_PROVIDER_SITE_OTHER): Payer: 59 | Admitting: Family Medicine

## 2021-10-05 VITALS — BP 126/78 | HR 85 | Temp 97.5°F | Ht 69.0 in | Wt 267.4 lb

## 2021-10-05 DIAGNOSIS — K860 Alcohol-induced chronic pancreatitis: Secondary | ICD-10-CM | POA: Diagnosis not present

## 2021-10-05 MED ORDER — OXYCODONE-ACETAMINOPHEN 5-325 MG PO TABS: 1.0000 | ORAL_TABLET | ORAL | 0 refills | Status: AC | PRN

## 2021-10-05 NOTE — Progress Notes (Signed)
Subjective:    Patient ID: Eric Hodge, male    DOB: Apr 26, 1985, 36 y.o.   MRN: [id number]  HPI  Eric Hodge MLY:[id number] DOB: Jan 12, 1986 DOA: 09/19/2021   PCP: Donita Brooks, MD   Admit date: 09/19/2021 Discharge date: 09/25/2021   Admitted From: Home   Discharge disposition: Home     Recommendations for Outpatient Follow-Up:    Follow up with your primary care provider in one week. Check CBC, BMP, LFT, magnesium in the next visit Patient should be encouraged to quit alcohol.   Discharge Diagnosis:    Principal Problem:   Alcoholic pancreatitis Active Problems:   Elevated LFTs   Smoker   Alcohol abuse   Hypokalemia   Hypomagnesemia   AKI (acute kidney injury) (HCC)   Obesity, Class III, BMI 40-49.9 (morbid obesity) (HCC)   Discharge Condition: Improved.   Diet recommendation: Low sodium, heart healthy.    Wound care: None.   Code status: Full.     History of Present Illness:    35 year old male with past medical history of ADHD, obesity, alcohol abuse presented to hospital with abdominal pain.  Patient stated that he drinks at least 10 alcoholic drinks daily.  Lipase was 66 on presentation.  CT scan of the abdomen showed peripancreatic inflammation.  MRCP showed pseudocyst pancreatic edema and question of necrosis.  He did have some leukocytosis and low-grade fevers so GI was consulted.  At this time, patient is afebrile, has reactive leukocytosis and GI recommends de-escalation of antibiotic.  Patient had a slow response to conservative treatment in the hospital.     Hospital Course:    Following conditions were addressed during hospitalization as listed below,   Alcoholic pancreatitis, complicated Nonalcoholic.  Abnormal MRCP with pseudocyst/peripancreatic edema.  GI was consulted.  Was treated conservatively with gradual improvement..  Elevated LDL, triglycerides 297.  Had leukocytosis and low-grade fever so blood cultures was done which was negative.   Initially antibiotics was initiated which was discontinued.  Patient clinically improved.    Elevated LFTs Likely due to alcohol.   Hepatitis C antibody negative, HIV negative. Advised cessation of alcohol.  Resume statins on discharge.  LFTs are improved.   Acute kidney injury Resolved after received IV fluids.  Latest creatinine of 0.9.   Hypomagnesemia Improved with replacement.  Latest magnesium of 2.3.   Hypokalemia.  Replenished orally aggressively during hospitalization.     Hyperlipidemia Resume statin on discharge.   Alcohol abuse Continue multivitamin folic acid and thiamine on discharge..  Advised against alcohol.   Smoker Declined nicotine patch.   Morbid obesity. Body mass index is 41.64 kg/m.  Present on admission.  Would benefit from weight loss as outpatient.   Disposition.  At this time, patient is stable for disposition home with outpatient PCP follow-up.   Medical Consultants:    GI   Procedures:      None Subjective:    Today, patient was seen and examined at bedside.  Denies any nausea vomiting fever chills or rigor.  Has tolerated oral diet.  10/05/21 Patient is here today for follow-up.  He states that he was doing okay at home.  He was eating a bland diet including Jell-O and clear liquids.  However he recently tried to advance his diet and was eating more "partying" foods.  Over the last 2 days he has developed more significant pain in his abdomen that radiates into his back.  Afterwards, he backed off on his diet and the pain is  slightly better.  He denies any fevers or chills or nausea or vomiting.  He is completely stopped drinking and has been sober now for over 10 days.  He is also not smoke.  He denies any chest pain or shortness of breath.  He does request something for pain because he is having a difficult time sleeping at night due to the pain in his back.  He denies any blood in his stool.  He denies any melena.  He denies any hematemesis.   We spent about 20 minutes today going over his CAT scan and the natural history of pancreatitis Past Medical History:  Diagnosis Date   ADHD (attention deficit hyperactivity disorder)    Allergy    Elevated LFTs    Hyperlipidemia    Retained orthopedic hardware 02/2017   right distal radius   Runny nose 03/06/2017   clear drainage, per pt.   Past Surgical History:  Procedure Laterality Date   CARPAL TUNNEL RELEASE Right 10/06/2016   Procedure: CARPAL TUNNEL RELEASE;  Surgeon: Leanora Cover, MD;  Location: [place];  Service: Orthopedics;  Laterality: Right;   FOREIGN BODY REMOVAL  09/08/2011   Procedure: REMOVAL FOREIGN BODY EXTREMITY;  Surgeon: Tennis Must, MD;  Location: [place];  Service: Orthopedics;  Laterality: Left;  FOREIGN BODY REMOVAL LEFT LONG FINGER   HARDWARE REMOVAL Right 03/10/2017   Procedure: HARDWARE REMOVAL RIGHT WRIST;  Surgeon: Leanora Cover, MD;  Location: [place];  Service: Orthopedics;  Laterality: Right;   MASS EXCISION Left 10/29/2015   Procedure: LEFT WRIST EXCISION MASS;  Surgeon: Leanora Cover, MD;  Location: [place];  Service: Orthopedics;  Laterality: Left;  LEFT WRIST EXCISION MASS   OPEN REDUCTION INTERNAL FIXATION (ORIF) DISTAL RADIAL FRACTURE Right 10/06/2016   Procedure: OPEN REDUCTION INTERNAL FIXATION (ORIF) RIGHT DISTAL RADIUS;  Surgeon: Leanora Cover, MD;  Location: [place];  Service: Orthopedics;  Laterality: Right;   [place] EXTRACTION  2012   Current Outpatient Medications on File Prior to Visit  Medication Sig Dispense Refill   azelastine (ASTELIN) 0.1 % nasal spray Place 2 sprays into both nostrils 2 (two) times daily. Use in each nostril as directed 30 mL 12   Cetirizine HCl (ZYRTEC ALLERGY) 10 MG CAPS Take 1 capsule (10 mg total) by mouth daily as needed. 90 capsule 3   folic acid (FOLVITE) 1 MG tablet Take 1 tablet (1 mg total) by mouth daily. 100 tablet  0   montelukast (SINGULAIR) 10 MG tablet TAKE 1 TABLET(10 MG) BY MOUTH DAILY AS NEEDED FOR ALLERGIES 30 tablet 2   Multiple Vitamin (MULTIVITAMIN WITH MINERALS) TABS tablet Take 1 tablet by mouth daily. 100 tablet 0   omeprazole (PRILOSEC) 20 MG capsule TAKE 1 CAPSULE(20 MG) BY MOUTH DAILY 30 capsule 11   simethicone (MYLICON) 80 MG chewable tablet Chew 1 tablet (80 mg total) by mouth every 6 (six) hours as needed for flatulence. 30 tablet 0   thiamine (VITAMIN B-1) 100 MG tablet Take 1 tablet (100 mg total) by mouth daily. 100 tablet 0   No current facility-administered medications on file prior to visit.   No Known Allergies Social History   Socioeconomic History   Marital status: Divorced    Spouse name: Not on file   Number of children: Not on file   Years of education: Not on file   Highest education level: Not on file  Occupational History   Not on file  Tobacco Use   Smoking status: Every Day    Packs/day: 0.50    Years: 10.00    Total pack years: 5.00    Types: Cigarettes   Smokeless tobacco: Never  Vaping Use   Vaping Use: Never used  Substance and Sexual Activity   Alcohol use: Yes    Alcohol/week: 6.0 standard drinks of alcohol    Types: 6 Shots of liquor per week    Comment: 4-5 days per week   Drug use: No   Sexual activity: Not Currently  Other Topics Concern   Not on file  Social History Narrative   Not on file   Social Determinants of Health   Financial Resource Strain: Not on file  Food Insecurity: Not on file  Transportation Needs: Not on file  Physical Activity: Not on file  Stress: Not on file  Social Connections: Not on file  Intimate Partner Violence: Not on file    Review of Systems     Objective:   Physical Exam Vitals reviewed.  Constitutional:      Appearance: Normal appearance. He is obese.  Cardiovascular:     Rate and Rhythm: Normal rate and regular rhythm.     Heart sounds: Normal heart sounds. No murmur heard.    No  friction rub. No gallop.  Pulmonary:     Effort: Pulmonary effort is normal. No respiratory distress.     Breath sounds: Normal breath sounds. No wheezing, rhonchi or rales.  Chest:     Chest wall: No tenderness.  Abdominal:     General: Bowel sounds are normal. There is no distension.     Palpations: Abdomen is soft.     Tenderness: There is no abdominal tenderness. There is no guarding or rebound.  Musculoskeletal:     Right lower leg: No edema.     Left lower leg: No edema.  Neurological:     Mental Status: He is alert.           Assessment & Plan:  Alcohol-induced chronic pancreatitis (HCC) - Plan: CBC with Differential/Platelet, COMPLETE METABOLIC PANEL WITH GFR, Lipase I tried to explain as best I could the source of the pain with pancreatitis.  I recommended that he eat a very bland diet.  I want him to focus on Gatorade, Jell-O, soup etc.  I want him to slowly advance his diet as tolerated eating only bland food.  I emphasized the need to be abstinent from alcohol.  I will give the patient pain medication but I advised him not to only mask the pain.  If the pain becomes intense or if he develops high fevers, I want him to go to the emergency room.  He did have a pseudocyst with questionable necrosis however at the present time there is no evidence of sepsis on his exam.  I believe that he likely just has pain related to his diet changes.  I will repeat a CBC and a CMP to monitor for any evidence of leukocytosis or electrolyte disturbances.  Recommended Alcoholics Anonymous.  Patient refused politely.

## 2021-10-06 LAB — CBC WITH DIFFERENTIAL/PLATELET
Absolute Monocytes: 580 cells/uL (ref 200–950)
Basophils Absolute: 60 cells/uL (ref 0–200)
Basophils Relative: 0.6 %
Eosinophils Absolute: 240 cells/uL (ref 15–500)
Eosinophils Relative: 2.4 %
HCT: 41.3 % (ref 38.5–50.0)
Hemoglobin: 14.4 g/dL (ref 13.2–17.1)
Lymphs Abs: 1380 cells/uL (ref 850–3900)
MCH: 33.3 pg — ABNORMAL HIGH (ref 27.0–33.0)
MCHC: 34.9 g/dL (ref 32.0–36.0)
MCV: 95.6 fL (ref 80.0–100.0)
MPV: 10.8 fL (ref 7.5–12.5)
Monocytes Relative: 5.8 %
Neutro Abs: 7740 cells/uL (ref 1500–7800)
Neutrophils Relative %: 77.4 %
Platelets: 449 10*3/uL — ABNORMAL HIGH (ref 140–400)
RBC: 4.32 10*6/uL (ref 4.20–5.80)
RDW: 12.3 % (ref 11.0–15.0)
Total Lymphocyte: 13.8 %
WBC: 10 10*3/uL (ref 3.8–10.8)

## 2021-10-06 LAB — COMPLETE METABOLIC PANEL WITH GFR
AG Ratio: 1.2 (calc) (ref 1.0–2.5)
ALT: 30 U/L (ref 9–46)
AST: 23 U/L (ref 10–40)
Albumin: 3.7 g/dL (ref 3.6–5.1)
Alkaline phosphatase (APISO): 88 U/L (ref 36–130)
BUN: 13 mg/dL (ref 7–25)
CO2: 25 mmol/L (ref 20–32)
Calcium: 9.4 mg/dL (ref 8.6–10.3)
Chloride: 102 mmol/L (ref 98–110)
Creat: 1.03 mg/dL (ref 0.60–1.26)
Globulin: 3 g/dL (calc) (ref 1.9–3.7)
Glucose, Bld: 110 mg/dL — ABNORMAL HIGH (ref 65–99)
Potassium: 4.8 mmol/L (ref 3.5–5.3)
Sodium: 139 mmol/L (ref 135–146)
Total Bilirubin: 0.4 mg/dL (ref 0.2–1.2)
Total Protein: 6.7 g/dL (ref 6.1–8.1)
eGFR: 98 mL/min/{1.73_m2} (ref >=60)

## 2021-10-06 LAB — LIPASE: Lipase: 107 U/L — ABNORMAL HIGH (ref 7–60)

## 2021-10-12 ENCOUNTER — Other Ambulatory Visit: Payer: Self-pay | Admitting: Family Medicine

## 2021-10-12 ENCOUNTER — Telehealth: Payer: Self-pay

## 2021-10-12 MED ORDER — OXYCODONE-ACETAMINOPHEN 5-325 MG PO TABS: 1.0000 | ORAL_TABLET | ORAL | 0 refills | Status: AC | PRN

## 2021-10-12 NOTE — Telephone Encounter (Signed)
Pt called and states his pain is improving but is still having some issues. Pt asks if he can receive a refill on the OxyCodone 5/325? Thank you.

## 2021-12-07 ENCOUNTER — Encounter: Payer: 59 | Admitting: Family Medicine

## 2021-12-13 ENCOUNTER — Encounter: Payer: Self-pay | Admitting: Family Medicine

## 2021-12-13 ENCOUNTER — Ambulatory Visit (INDEPENDENT_AMBULATORY_CARE_PROVIDER_SITE_OTHER): Payer: 59 | Admitting: Family Medicine

## 2021-12-13 VITALS — BP 126/72 | HR 83 | Ht 69.0 in | Wt 261.2 lb

## 2021-12-13 DIAGNOSIS — R5382 Chronic fatigue, unspecified: Secondary | ICD-10-CM | POA: Diagnosis not present

## 2021-12-13 DIAGNOSIS — Z0000 Encounter for general adult medical examination without abnormal findings: Secondary | ICD-10-CM | POA: Diagnosis not present

## 2021-12-13 NOTE — Progress Notes (Signed)
Subjective:    Patient ID: Eric Hodge, male    DOB: 04/18/1985, 36 y.o.   MRN: [id number]  HPI  Patient is a very pleasant 36 year old Caucasian gentleman who presents today for for complete physical exam.  He was recently admitted with alcoholic pancreatitis.  However since his discharge from the hospital, he has been abstinent from all alcohol.  He denies any depression.  He states that he was not drinking to try to manage anxiety or depression.  He essentially states that he was drinking out of boredom.  He does not believe that he needs any assistance to quit drinking.  He denies that he was self-medicating for any underlying issue.  He denies any residual abdominal pain.  He is tolerating food without difficulty.  He is back to eating a normal diet.  He does report fatigue.  He states that he has no energy.  He reports feeling tired all the time.  He also has a history of ADD.  He currently works in [employer], [employer].  He states that he will start 5 projects and be unable to complete any of the assigned task.  This is causing him a tremendous amount of stress at work.  Previously he was on Vyvanse 40 mg daily and he is interested in resuming that so that he can meet his responsibilities at home and at work Past Medical History:  Diagnosis Date   ADHD (attention deficit hyperactivity disorder)    Allergy    Elevated LFTs    Hyperlipidemia    Retained orthopedic hardware 02/2017   right distal radius   Runny nose 03/06/2017   clear drainage, per pt.   Past Surgical History:  Procedure Laterality Date   CARPAL TUNNEL RELEASE Right 10/06/2016   Procedure: CARPAL TUNNEL RELEASE;  Surgeon: Betha Loa, MD;  Location: [facility];  Service: Orthopedics;  Laterality: Right;   FOREIGN BODY REMOVAL  09/08/2011   Procedure: REMOVAL FOREIGN BODY EXTREMITY;  Surgeon: Tami Ribas, MD;  Location: [facility];  Service: Orthopedics;  Laterality: Left;  FOREIGN  BODY REMOVAL LEFT LONG FINGER   HARDWARE REMOVAL Right 03/10/2017   Procedure: HARDWARE REMOVAL RIGHT WRIST;  Surgeon: Betha Loa, MD;  Location: [facility];  Service: Orthopedics;  Laterality: Right;   MASS EXCISION Left 10/29/2015   Procedure: LEFT WRIST EXCISION MASS;  Surgeon: Betha Loa, MD;  Location: [facility];  Service: Orthopedics;  Laterality: Left;  LEFT WRIST EXCISION MASS   OPEN REDUCTION INTERNAL FIXATION (ORIF) DISTAL RADIAL FRACTURE Right 10/06/2016   Procedure: OPEN REDUCTION INTERNAL FIXATION (ORIF) RIGHT DISTAL RADIUS;  Surgeon: Betha Loa, MD;  Location: [facility];  Service: Orthopedics;  Laterality: Right;   WISDOM TOOTH EXTRACTION  2012   Current Outpatient Medications on File Prior to Visit  Medication Sig Dispense Refill   azelastine (ASTELIN) 0.1 % nasal spray Place 2 sprays into both nostrils 2 (two) times daily. Use in each nostril as directed 30 mL 12   Cetirizine HCl (ZYRTEC ALLERGY) 10 MG CAPS Take 1 capsule (10 mg total) by mouth daily as needed. 90 capsule 3   montelukast (SINGULAIR) 10 MG tablet TAKE 1 TABLET(10 MG) BY MOUTH DAILY AS NEEDED FOR ALLERGIES 30 tablet 2   Multiple Vitamin (MULTIVITAMIN WITH MINERALS) TABS tablet Take 1 tablet by mouth daily. 100 tablet 0   omeprazole (PRILOSEC) 20 MG capsule TAKE 1 CAPSULE(20 MG) BY MOUTH DAILY 30 capsule 11  No current facility-administered medications on file prior to visit.   No Known Allergies Social History   Socioeconomic History   Marital status: Divorced    Spouse name: Not on file   Number of children: Not on file   Years of education: Not on file   Highest education level: Not on file  Occupational History   Not on file  Tobacco Use   Smoking status: Every Day    Packs/day: 0.50    Years: 10.00    Total pack years: 5.00    Types: Cigarettes   Smokeless tobacco: Never  Vaping Use   Vaping Use: Never used  Substance and Sexual Activity    Alcohol use: Yes    Alcohol/week: 6.0 standard drinks of alcohol    Types: 6 Shots of liquor per week    Comment: 4-5 days per week   Drug use: No   Sexual activity: Not Currently  Other Topics Concern   Not on file  Social History Narrative   Not on file   Social Determinants of Health   Financial Resource Strain: Not on file  Food Insecurity: Not on file  Transportation Needs: Not on file  Physical Activity: Not on file  Stress: Not on file  Social Connections: Not on file  Intimate Partner Violence: Not on file    Review of Systems     Objective:   Physical Exam Vitals reviewed.  Constitutional:      Appearance: Normal appearance. He is obese.  Cardiovascular:     Rate and Rhythm: Normal rate and regular rhythm.     Heart sounds: Normal heart sounds. No murmur heard.    No friction rub. No gallop.  Pulmonary:     Effort: Pulmonary effort is normal. No respiratory distress.     Breath sounds: Normal breath sounds. No wheezing, rhonchi or rales.  Chest:     Chest wall: No tenderness.  Abdominal:     General: Bowel sounds are normal. There is no distension.     Palpations: Abdomen is soft.     Tenderness: There is no abdominal tenderness. There is no guarding or rebound.  Musculoskeletal:     Right lower leg: No edema.     Left lower leg: No edema.  Neurological:     Mental Status: He is alert.           Assessment & Plan:  General medical exam - Plan: COMPLETE METABOLIC PANEL WITH GFR, CBC with Differential/Platelet, Lipid panel  Chronic fatigue - Plan: Vitamin B12, TSH, Testosterone Total,Free,Bio, Males Patient's physical exam today is normal except for his weight.  I will check a CBC a CMP and lipid panel regarding his general medical exam and then check a testosterone level, TSH, and a B12 because of his fatigue.  If his lab work is normal, we will trial Vyvanse 30 mg a day for ADD.  He declines a COVID booster.  He is already had his flu shot.  Patient  states that he does not need any assistance with alcohol abstinence.  He denies any underlying anxiety or depression that requires treatment.  He states that he is doing well and plans to remain sober.

## 2021-12-14 ENCOUNTER — Other Ambulatory Visit: Payer: Self-pay | Admitting: Family Medicine

## 2021-12-14 ENCOUNTER — Other Ambulatory Visit: Payer: Self-pay

## 2021-12-14 LAB — COMPLETE METABOLIC PANEL WITH GFR
AG Ratio: 1.6 (calc) (ref 1.0–2.5)
ALT: 52 U/L — ABNORMAL HIGH (ref 9–46)
AST: 23 U/L (ref 10–40)
Albumin: 4.7 g/dL (ref 3.6–5.1)
Alkaline phosphatase (APISO): 103 U/L (ref 36–130)
BUN: 13 mg/dL (ref 7–25)
CO2: 23 mmol/L (ref 20–32)
Calcium: 9.6 mg/dL (ref 8.6–10.3)
Chloride: 105 mmol/L (ref 98–110)
Creat: 0.92 mg/dL (ref 0.60–1.26)
Globulin: 2.9 g/dL (calc) (ref 1.9–3.7)
Glucose, Bld: 99 mg/dL (ref 65–99)
Potassium: 3.9 mmol/L (ref 3.5–5.3)
Sodium: 139 mmol/L (ref 135–146)
Total Bilirubin: 0.7 mg/dL (ref 0.2–1.2)
Total Protein: 7.6 g/dL (ref 6.1–8.1)
eGFR: 112 mL/min/{1.73_m2} (ref >=60)

## 2021-12-14 LAB — LIPID PANEL
Cholesterol: 277 mg/dL — ABNORMAL HIGH (ref <200)
HDL: 40 mg/dL (ref >=40)
LDL Cholesterol (Calc): 189 mg/dL (calc) — ABNORMAL HIGH
Non-HDL Cholesterol (Calc): 237 mg/dL (calc) — ABNORMAL HIGH (ref <130)
Total CHOL/HDL Ratio: 6.9 (calc) — ABNORMAL HIGH (ref <5.0)
Triglycerides: 268 mg/dL — ABNORMAL HIGH (ref <150)

## 2021-12-14 LAB — CBC WITH DIFFERENTIAL/PLATELET
Absolute Monocytes: 589 cells/uL (ref 200–950)
Basophils Absolute: 58 cells/uL (ref 0–200)
Basophils Relative: 0.7 %
Eosinophils Absolute: 232 cells/uL (ref 15–500)
Eosinophils Relative: 2.8 %
HCT: 43.2 % (ref 38.5–50.0)
Hemoglobin: 15.3 g/dL (ref 13.2–17.1)
Lymphs Abs: 1785 cells/uL (ref 850–3900)
MCH: 32.8 pg (ref 27.0–33.0)
MCHC: 35.4 g/dL (ref 32.0–36.0)
MCV: 92.5 fL (ref 80.0–100.0)
MPV: 10 fL (ref 7.5–12.5)
Monocytes Relative: 7.1 %
Neutro Abs: 5636 cells/uL (ref 1500–7800)
Neutrophils Relative %: 67.9 %
Platelets: 219 10*3/uL (ref 140–400)
RBC: 4.67 10*6/uL (ref 4.20–5.80)
RDW: 13.9 % (ref 11.0–15.0)
Total Lymphocyte: 21.5 %
WBC: 8.3 10*3/uL (ref 3.8–10.8)

## 2021-12-14 LAB — TESTOSTERONE TOTAL,FREE,BIO, MALES
Albumin: 4.7 g/dL (ref 3.6–5.1)
Sex Hormone Binding: 17 nmol/L (ref 10–50)
Testosterone: 165 ng/dL — ABNORMAL LOW (ref 250–827)

## 2021-12-14 LAB — TSH: TSH: 0.97 mIU/L (ref 0.40–4.50)

## 2021-12-14 LAB — VITAMIN B12: Vitamin B-12: 411 pg/mL (ref 200–1100)

## 2021-12-14 MED ORDER — ROSUVASTATIN CALCIUM 10 MG PO TABS: 10.0000 mg | ORAL_TABLET | Freq: Every day | ORAL | 3 refills | Status: DC

## 2021-12-14 MED ORDER — TESTOSTERONE CYPIONATE 200 MG/ML IM SOLN: 200.0000 mg | INTRAMUSCULAR | 0 refills | Status: DC

## 2021-12-15 ENCOUNTER — Ambulatory Visit (INDEPENDENT_AMBULATORY_CARE_PROVIDER_SITE_OTHER): Payer: 59

## 2021-12-15 DIAGNOSIS — Z7189 Other specified counseling: Secondary | ICD-10-CM | POA: Diagnosis not present

## 2022-01-05 ENCOUNTER — Other Ambulatory Visit: Payer: Self-pay

## 2022-01-05 ENCOUNTER — Emergency Department (HOSPITAL_COMMUNITY)
Admission: EM | Admit: 2022-01-05 | Discharge: 2022-01-05 | Disposition: A | Discharge status: Home / Self Care | Payer: 59 | Attending: Emergency Medicine | Admitting: Emergency Medicine

## 2022-01-05 ENCOUNTER — Emergency Department (HOSPITAL_COMMUNITY): Payer: 59

## 2022-01-05 ENCOUNTER — Ambulatory Visit (HOSPITAL_COMMUNITY)
Admission: EM | Admit: 2022-01-05 | Discharge: 2022-01-05 | Disposition: A | Discharge status: Home / Self Care | Payer: 59

## 2022-01-05 ENCOUNTER — Encounter (HOSPITAL_COMMUNITY): Payer: Self-pay

## 2022-01-05 ENCOUNTER — Encounter (HOSPITAL_COMMUNITY): Payer: Self-pay | Admitting: Emergency Medicine

## 2022-01-05 DIAGNOSIS — Z8719 Personal history of other diseases of the digestive system: Secondary | ICD-10-CM

## 2022-01-05 DIAGNOSIS — R1013 Epigastric pain: Secondary | ICD-10-CM | POA: Diagnosis not present

## 2022-01-05 DIAGNOSIS — K863 Pseudocyst of pancreas: Secondary | ICD-10-CM | POA: Diagnosis not present

## 2022-01-05 DIAGNOSIS — R509 Fever, unspecified: Secondary | ICD-10-CM

## 2022-01-05 DIAGNOSIS — K861 Other chronic pancreatitis: Secondary | ICD-10-CM | POA: Diagnosis not present

## 2022-01-05 DIAGNOSIS — Z1152 Encounter for screening for COVID-19: Secondary | ICD-10-CM | POA: Diagnosis not present

## 2022-01-05 DIAGNOSIS — R1010 Upper abdominal pain, unspecified: Secondary | ICD-10-CM | POA: Diagnosis present

## 2022-01-05 LAB — COMPREHENSIVE METABOLIC PANEL
ALT: 52 U/L — ABNORMAL HIGH (ref 0–44)
AST: 25 U/L (ref 15–41)
Albumin: 3 g/dL — ABNORMAL LOW (ref 3.5–5.0)
Alkaline Phosphatase: 94 U/L (ref 38–126)
Anion gap: 15 (ref 5–15)
BUN: 7 mg/dL (ref 6–20)
CO2: 20 mmol/L — ABNORMAL LOW (ref 22–32)
Calcium: 9.3 mg/dL (ref 8.9–10.3)
Chloride: 102 mmol/L (ref 98–111)
Creatinine, Ser: 1.05 mg/dL (ref 0.61–1.24)
GFR, Estimated: >60 mL/min (ref >60)
Glucose, Bld: 142 mg/dL — ABNORMAL HIGH (ref 70–99)
Potassium: 3 mmol/L — ABNORMAL LOW (ref 3.5–5.1)
Sodium: 137 mmol/L (ref 135–145)
Total Bilirubin: 0.9 mg/dL (ref 0.3–1.2)
Total Protein: 7.1 g/dL (ref 6.5–8.1)

## 2022-01-05 LAB — LACTIC ACID, PLASMA: Lactic Acid, Venous: 0.9 mmol/L (ref 0.5–1.9)

## 2022-01-05 LAB — CBC
HCT: 38.8 % — ABNORMAL LOW (ref 39.0–52.0)
Hemoglobin: 13.4 g/dL (ref 13.0–17.0)
MCH: 31.3 pg (ref 26.0–34.0)
MCHC: 34.5 g/dL (ref 30.0–36.0)
MCV: 90.7 fL (ref 80.0–100.0)
Platelets: 297 10*3/uL (ref 150–400)
RBC: 4.28 MIL/uL (ref 4.22–5.81)
RDW: 13.5 % (ref 11.5–15.5)
WBC: 9.1 10*3/uL (ref 4.0–10.5)
nRBC: 0 % (ref 0.0–0.2)

## 2022-01-05 LAB — URINALYSIS, ROUTINE W REFLEX MICROSCOPIC
Bacteria, UA: NONE SEEN
Bilirubin Urine: NEGATIVE
Glucose, UA: NEGATIVE mg/dL
Hgb urine dipstick: NEGATIVE
Ketones, ur: 20 mg/dL — AB
Leukocytes,Ua: NEGATIVE
Nitrite: NEGATIVE
Protein, ur: 30 mg/dL — AB
Specific Gravity, Urine: >1.046 — ABNORMAL HIGH (ref 1.005–1.030)
pH: 6 (ref 5.0–8.0)

## 2022-01-05 LAB — RESP PANEL BY RT-PCR (RSV, FLU A&B, COVID)  RVPGX2
Influenza A by PCR: NEGATIVE
Influenza B by PCR: NEGATIVE
Resp Syncytial Virus by PCR: NEGATIVE
SARS Coronavirus 2 by RT PCR: NEGATIVE

## 2022-01-05 LAB — MAGNESIUM: Magnesium: 2 mg/dL (ref 1.7–2.4)

## 2022-01-05 LAB — LIPASE, BLOOD: Lipase: 34 U/L (ref 11–51)

## 2022-01-05 MED ORDER — PANCRELIPASE (LIP-PROT-AMYL) 36000-114000 UNITS PO CPEP: ORAL_CAPSULE | ORAL | 11 refills | Status: DC

## 2022-01-05 MED ORDER — POTASSIUM CHLORIDE 10 MEQ/100ML IV SOLN
10.0000 meq | Freq: Once | INTRAVENOUS | Status: AC
  Administered 2022-01-05: 10 meq via INTRAVENOUS
  Filled 2022-01-05: qty 100

## 2022-01-05 MED ORDER — SODIUM CHLORIDE 0.9 % IV BOLUS
1000.0000 mL | Freq: Once | INTRAVENOUS | Status: AC
  Administered 2022-01-05: 1000 mL via INTRAVENOUS

## 2022-01-05 MED ORDER — IOHEXOL 350 MG/ML SOLN
100.0000 mL | Freq: Once | INTRAVENOUS | Status: AC | PRN
  Administered 2022-01-05: 100 mL via INTRAVENOUS

## 2022-01-05 MED ORDER — MORPHINE SULFATE (PF) 4 MG/ML IV SOLN
4.0000 mg | Freq: Once | INTRAVENOUS | Status: AC
  Administered 2022-01-05: 4 mg via INTRAVENOUS
  Filled 2022-01-05: qty 1

## 2022-01-05 MED ORDER — POTASSIUM CHLORIDE CRYS ER 20 MEQ PO TBCR
40.0000 meq | EXTENDED_RELEASE_TABLET | Freq: Once | ORAL | Status: AC
  Administered 2022-01-05: 40 meq via ORAL
  Filled 2022-01-05: qty 2

## 2022-01-05 NOTE — ED Triage Notes (Signed)
Pt states epigastric pain and bloating for the past 5 days. State he took gas x with no relief.

## 2022-01-05 NOTE — ED Triage Notes (Signed)
Pt reports upper abd pain for 6 days. Hx of pancreatitis. Pt had flu last week. Denies n/v.

## 2022-01-05 NOTE — ED Provider Notes (Signed)
[facility] EMERGENCY DEPARTMENT Provider Note   CSN: [id number] Arrival date & time: 01/05/22  4098     History  Chief Complaint  Patient presents with   Abdominal Pain    Lory Galan is a 36 y.o. male with a past medical history significant for ADHD, hyperlipidemia, GERD, history of alcoholic pancreatitis who presents to the ED due to upper abdominal pain x 6 days.  Patient evaluated at urgent care prior to arrival and sent to the ED for further evaluation.  Patient states 6 days ago he developed a fever associated with upper abdominal pain. Denies cough and sore throat.  History of alcoholic pancreatitis with admission in September 2023.  Patient states after his admission he stopped drinking alcohol daily and now only occasionally drinks.  Last drink alcohol a few weeks ago.  Denies chronic NSAIDs.  Denies nausea, vomiting, diarrhea.  No previous abdominal operations. No urinary symptoms.   History obtained from patient and past medical records. No interpreter used during encounter.       Home Medications Prior to Admission medications   Medication Sig Start Date End Date Taking? Authorizing Provider  azelastine (ASTELIN) 0.1 % nasal spray Place 2 sprays into both nostrils 2 (two) times daily. Use in each nostril as directed 11/01/17   Dorena Bodo, PA-C  Cetirizine HCl (ZYRTEC ALLERGY) 10 MG CAPS Take 1 capsule (10 mg total) by mouth daily as needed. 11/01/17   Dixon, Corrie Dandy B, PA-C  montelukast (SINGULAIR) 10 MG tablet TAKE 1 TABLET(10 MG) BY MOUTH DAILY AS NEEDED FOR ALLERGIES 11/05/20   Donita Brooks, MD  omeprazole (PRILOSEC) 20 MG capsule TAKE 1 CAPSULE(20 MG) BY MOUTH DAILY 12/24/19   Donita Brooks, MD  rosuvastatin (CRESTOR) 10 MG tablet Take 1 tablet (10 mg total) by mouth daily. 12/14/21   Donita Brooks, MD  testosterone cypionate (DEPOTESTOSTERONE CYPIONATE) 200 MG/ML injection Inject 1 mL (200 mg total) into the muscle every 14 (fourteen)  days. 12/14/21   Donita Brooks, MD      Allergies    Patient has no known allergies.    Review of Systems   Review of Systems  Constitutional:  Positive for chills and fever.  Respiratory:  Negative for shortness of breath.   Cardiovascular:  Negative for chest pain and leg swelling.  Gastrointestinal:  Positive for abdominal pain. Negative for diarrhea, nausea and vomiting.  Genitourinary:  Negative for dysuria.  All other systems reviewed and are negative.   Physical Exam Updated Vital Signs BP 125/82   Pulse 84   Temp 98.2 F (36.8 C) (Oral)   Resp 17   Ht 5\' 9"  (1.753 m)   Wt 118 kg   SpO2 99%   BMI 38.42 kg/m  Physical Exam Vitals and nursing note reviewed.  Constitutional:      General: He is not in acute distress.    Appearance: He is not ill-appearing.  HENT:     Head: Normocephalic.  Eyes:     Pupils: Pupils are equal, round, and reactive to light.  Cardiovascular:     Rate and Rhythm: Normal rate and regular rhythm.     Pulses: Normal pulses.     Heart sounds: Normal heart sounds. No murmur heard.    No friction rub. No gallop.  Pulmonary:     Effort: Pulmonary effort is normal.     Breath sounds: Normal breath sounds.  Abdominal:     General: Abdomen is flat. There is  no distension.     Palpations: Abdomen is soft.     Tenderness: There is abdominal tenderness. There is no guarding or rebound.     Comments: Epigastric tenderness  Musculoskeletal:        General: Normal range of motion.     Cervical back: Neck supple.  Skin:    General: Skin is warm and dry.  Neurological:     General: No focal deficit present.     Mental Status: He is alert.  Psychiatric:        Mood and Affect: Mood normal.        Behavior: Behavior normal.     ED Results / Procedures / Treatments   Labs (all labs ordered are listed, but only abnormal results are displayed) Labs Reviewed  COMPREHENSIVE METABOLIC PANEL - Abnormal; Notable for the following components:       Result Value   Potassium 3.0 (*)    CO2 20 (*)    Glucose, Bld 142 (*)    Albumin 3.0 (*)    ALT 52 (*)    All other components within normal limits  CBC - Abnormal; Notable for the following components:   HCT 38.8 (*)    All other components within normal limits  RESP PANEL BY RT-PCR (RSV, FLU A&B, COVID)  RVPGX2  LIPASE, BLOOD  MAGNESIUM  LACTIC ACID, PLASMA  URINALYSIS, ROUTINE W REFLEX MICROSCOPIC    EKG EKG Interpretation  Date/Time:  Wednesday January 05 2022 10:49:47 EST Ventricular Rate:  88 PR Interval:  168 QRS Duration: 97 QT Interval:  358 QTC Calculation: 434 R Axis:   54 Text Interpretation: Sinus rhythm ST elev, probable normal early repol pattern No significant change since last tracing Confirmed by Ernie Avena ([id number]) on 01/05/2022 2:18:04 PM  Radiology CT ABDOMEN PELVIS W CONTRAST  Result Date: 01/05/2022 CLINICAL DATA:  Epigastric pain EXAM: CT ABDOMEN AND PELVIS WITH CONTRAST TECHNIQUE: Multidetector CT imaging of the abdomen and pelvis was performed using the standard protocol following bolus administration of intravenous contrast. RADIATION DOSE REDUCTION: This exam was performed according to the departmental dose-optimization program which includes automated exposure control, adjustment of the mA and/or kV according to patient size and/or use of iterative reconstruction technique. CONTRAST:  100 mL OMNIPAQUE IOHEXOL 350 MG/ML SOLN COMPARISON:  None Available. FINDINGS: Lower chest: Minimal dependent subsegmental atelectasis or scarring left base. No pleural or pericardial effusion abnormality. Hepatobiliary: No focal liver abnormality is seen. Gallbladder is distended. No gallstones, gallbladder wall thickening, or biliary dilatation. Pancreas: Numerous cystic lesions identified in the pancreas consistent with pseudocysts. The largest is projecting anteriorly involving the wall of the stomach. This measures 10 cm. There is extensive peripancreatic  stranding. Spleen: Enlarged spleen with homogeneous texture measuring 19 cm. Adrenals/Urinary Tract: Adrenal glands are unremarkable. Kidneys are normal, without renal calculi, focal lesion, or hydronephrosis. Bladder is unremarkable. Stomach/Bowel: Stomach is within normal limits. Appendix appears normal. No evidence of bowel wall thickening, distention, or inflammatory changes. Vascular/Lymphatic: No significant vascular findings are present. No enlarged abdominal or pelvic lymph nodes. Reproductive: Prostate is unremarkable. Other: No abdominal wall hernia or abnormality. No abdominopelvic ascites. Musculoskeletal: No acute or significant osseous findings. IMPRESSION: 1. Severe chronic pancreatitis with diffuse cystic changes and large pseudocysts measuring up to 10 cm. Peripancreatic inflammatory changes consistent with some degree of acute pancreatitis as well. 2. Splenomegaly. Electronically Signed   By: Layla Maw M.D.   On: 01/05/2022 12:39    Procedures Procedures    Medications  Ordered in ED Medications  potassium chloride SA (KLOR-CON M) CR tablet 40 mEq (40 mEq Oral Given 01/05/22 1051)  potassium chloride 10 mEq in 100 mL IVPB (0 mEq Intravenous Stopped 01/05/22 1238)  morphine (PF) 4 MG/ML injection 4 mg (4 mg Intravenous Given 01/05/22 1054)  sodium chloride 0.9 % bolus 1,000 mL (0 mLs Intravenous Stopped 01/05/22 1238)  iohexol (OMNIPAQUE) 350 MG/ML injection 100 mL (100 mLs Intravenous Contrast Given 01/05/22 1222)    ED Course/ Medical Decision Making/ A&P Clinical Course as of 01/05/22 1447  Wed Jan 05, 2022  1018 Potassium(!): 3.0 [CA]  1220 Lactic Acid, Venous: 0.9 [CA]  1220 Lipase: 34 [CA]    Clinical Course User Index [CA] Mannie Stabile, PA-C                           Medical Decision Making Amount and/or Complexity of Data Reviewed External Data Reviewed: notes.    Details: Previous admission notes Labs: ordered. Decision-making details documented in  ED Course. Radiology: ordered and independent interpretation performed. Decision-making details documented in ED Course. ECG/medicine tests: ordered and independent interpretation performed. Decision-making details documented in ED Course.  Risk Prescription drug management.   This patient presents to the ED for concern of epigastric pain, this involves an extensive number of treatment options, and is a complaint that carries with it a high risk of complications and morbidity.  The differential diagnosis includes pancreatitis, acute cholecystitis, bowel obstruction, liver etiology, etc  36 year old male presents to the ED due to upper abdominal pain x 6 days.  Patient endorses a fever at onset of symptoms which has since resolved.  History of alcoholic pancreatitis with admission in September 2023.  Patient stopped drinking alcohol daily in September and now only occasionally drinks.  Last drank a few weeks ago.  No chronic NSAIDs.  Upon arrival stable vitals.  Patient in no acute distress.  Some tenderness in epigastric region.  Given history of complicated pancreatitis will obtain CT abdomen to rule out abscess formation versus evidence of pancreatitis.  Routine labs ordered.  IV fluids and morphine given.  Will also obtain respiratory panel given fever a few days ago.  CBC reassuring.  No leukocytosis.  Normal hemoglobin.  Lipase normal at 34.  CMP significant for hypokalemia at 3.  Potassium repleted here in the ED.  Hyperglycemia at 142.  No anion gap.  Doubt DKA.  Mild elevation in ALT at 52.  CT abdomen demonstrates severe chronic pancreatitis with a large pseudocyst measuring up to 10 cm.  Also demonstrates inflammatory changes consistent with acute pancreatitis.  Splenomegaly.  Will discuss with GI for further evaluation.  EKG demonstrates normal sinus rhythm.  Possible early repolarization.  Low suspicion for atypical ACS.  2:43 PM Discussed with Jennye Moccasin, PA-C with [facility] who will  evaluate patient.   Patient handed off to [facility], PA-C at shift change pending GI recommendations.        Final Clinical Impression(s) / ED Diagnoses Final diagnoses:  Chronic pancreatitis, unspecified pancreatitis type [facility])  Pancreatic pseudocyst    Rx / DC Orders ED Discharge Orders     None         Jesusita Oka 01/05/22 1447    Ernie Avena, MD 01/05/22 2116

## 2022-01-05 NOTE — ED Notes (Signed)
Patient is being discharged from the Urgent Care and sent to the Emergency Department via private vehicle  . Per E. Raspet PA, patient is in need of higher level of care due to abdominal pain. Patient is aware and verbalizes understanding of plan of care.  Vitals:   01/05/22 0820  BP: 133/86  Pulse: (!) 105  Resp: 16  Temp: 98.1 F (36.7 C)  SpO2: 98%

## 2022-01-05 NOTE — ED Provider Notes (Signed)
[facility]    CSN: [id number] Arrival date & time: 01/05/22  0801      History   Chief Complaint Chief Complaint  Patient presents with   Abdominal Pain    HPI Eric Hodge is a 36 y.o. male.   Patient presents today with a 5-day history of worsening upper abdominal pain.  He reports that pain is currently rated 3/4 on a 0-10 pain scale, described as a dull aching sensation in his epigastrium, worse with palpation or contracting his abdominal muscles, no alleviating factors identified.  He has tried Gas-X and ibuprofen without improvement of symptoms.  He has a history of pancreatitis but states current symptoms are less severe than previous episodes of this condition; last episode was September 2023.  He has not been drinking alcohol recently.  Denies regular NSAID use.  Denies any medication changes, suspicious food intake, known sick contacts.  Does report that he has had a fever as high as 103 F when symptoms began but is not had a fever in the past several days.  Denies any additional symptoms including cough/congestion.  Denies any nausea, vomiting, diarrhea, melena, hematochezia.  Denies previous abdominal surgery and still has gallbladder.    Past Medical History:  Diagnosis Date   ADHD (attention deficit hyperactivity disorder)    Allergy    Elevated LFTs    Hyperlipidemia    Retained orthopedic hardware 02/2017   right distal radius   Runny nose 03/06/2017   clear drainage, per pt.    Patient Active Problem List   Diagnosis Date Noted   Hypokalemia 09/22/2021   Hypomagnesemia 09/22/2021   AKI (acute kidney injury) (HCC) 09/22/2021   Obesity, Class III, BMI 40-49.9 (morbid obesity) (HCC) 09/22/2021   Alcoholic pancreatitis 09/19/2021   Alcohol abuse 09/19/2021   ADHD (attention deficit hyperactivity disorder)    Elevated LFTs 03/01/2018   GERD (gastroesophageal reflux disease) 11/01/2017   Family history of cardiovascular disease 04/20/2017    Family history of hyperlipidemia 04/20/2017   Allergic rhinitis 03/04/2015   Hyperlipidemia 12/26/2013   ADD (attention deficit disorder) 11/26/2013   Smoker 11/26/2013    Past Surgical History:  Procedure Laterality Date   CARPAL TUNNEL RELEASE Right 10/06/2016   Procedure: CARPAL TUNNEL RELEASE;  Surgeon: Betha Loa, MD;  Location: [facility];  Service: Orthopedics;  Laterality: Right;   FOREIGN BODY REMOVAL  09/08/2011   Procedure: REMOVAL FOREIGN BODY EXTREMITY;  Surgeon: Tami Ribas, MD;  Location: [facility];  Service: Orthopedics;  Laterality: Left;  FOREIGN BODY REMOVAL LEFT LONG FINGER   HARDWARE REMOVAL Right 03/10/2017   Procedure: HARDWARE REMOVAL RIGHT WRIST;  Surgeon: Betha Loa, MD;  Location: [facility];  Service: Orthopedics;  Laterality: Right;   MASS EXCISION Left 10/29/2015   Procedure: LEFT WRIST EXCISION MASS;  Surgeon: Betha Loa, MD;  Location: [facility];  Service: Orthopedics;  Laterality: Left;  LEFT WRIST EXCISION MASS   OPEN REDUCTION INTERNAL FIXATION (ORIF) DISTAL RADIAL FRACTURE Right 10/06/2016   Procedure: OPEN REDUCTION INTERNAL FIXATION (ORIF) RIGHT DISTAL RADIUS;  Surgeon: Betha Loa, MD;  Location: [facility];  Service: Orthopedics;  Laterality: Right;   WISDOM TOOTH EXTRACTION  2012       Home Medications    Prior to Admission medications   Medication Sig Start Date End Date Taking? Authorizing Provider  azelastine (ASTELIN) 0.1 % nasal spray Place 2 sprays into both nostrils 2 (two) times daily. Use in  each nostril as directed 11/01/17   Dorena Bodo, PA-C  Cetirizine HCl (ZYRTEC ALLERGY) 10 MG CAPS Take 1 capsule (10 mg total) by mouth daily as needed. 11/01/17   Dixon, Corrie Dandy B, PA-C  montelukast (SINGULAIR) 10 MG tablet TAKE 1 TABLET(10 MG) BY MOUTH DAILY AS NEEDED FOR ALLERGIES 11/05/20   Donita Brooks, MD  omeprazole (PRILOSEC) 20 MG capsule TAKE 1 CAPSULE(20  MG) BY MOUTH DAILY 12/24/19   Donita Brooks, MD  rosuvastatin (CRESTOR) 10 MG tablet Take 1 tablet (10 mg total) by mouth daily. 12/14/21   Donita Brooks, MD  testosterone cypionate (DEPOTESTOSTERONE CYPIONATE) 200 MG/ML injection Inject 1 mL (200 mg total) into the muscle every 14 (fourteen) days. 12/14/21   Donita Brooks, MD    Family History Family History  Problem Relation Age of Onset   Stroke Paternal Grandmother    Hypertension Paternal Grandmother    Cancer Paternal Grandfather    Hyperlipidemia Paternal Grandfather    Coronary artery disease Father 34       At 72 had MI --> stent. This was his 1st dx CAD    Social History Social History   Tobacco Use   Smoking status: Every Day    Packs/day: 0.50    Years: 10.00    Total pack years: 5.00    Types: Cigarettes   Smokeless tobacco: Never  Vaping Use   Vaping Use: Never used  Substance Use Topics   Alcohol use: Yes    Alcohol/week: 6.0 standard drinks of alcohol    Types: 6 Shots of liquor per week    Comment: 4-5 days per week   Drug use: No     Allergies   Patient has no known allergies.   Review of Systems Review of Systems  Constitutional:  Positive for activity change. Negative for appetite change, fatigue and fever.  Respiratory:  Negative for cough and shortness of breath.   Cardiovascular:  Negative for chest pain.  Gastrointestinal:  Positive for abdominal pain. Negative for diarrhea, nausea and vomiting.  Musculoskeletal:  Negative for arthralgias and myalgias.  Neurological:  Negative for dizziness, light-headedness and headaches.     Physical Exam Triage Vital Signs ED Triage Vitals  Enc Vitals Group     BP 01/05/22 0820 133/86     Pulse Rate 01/05/22 0820 (!) 105     Resp 01/05/22 0820 16     Temp 01/05/22 0820 98.1 F (36.7 C)     Temp Source 01/05/22 0820 Oral     SpO2 01/05/22 0820 98 %     Weight --      Height --      Head Circumference --      Peak Flow --      Pain  Score 01/05/22 0821 4     Pain Loc --      Pain Edu? --      Excl. in GC? --    No data found.  Updated Vital Signs BP 133/86 (BP Location: Right Arm)   Pulse (!) 105   Temp 98.1 F (36.7 C) (Oral)   Resp 16   SpO2 98%   Visual Acuity Right Eye Distance:   Left Eye Distance:   Bilateral Distance:    Right Eye Near:   Left Eye Near:    Bilateral Near:     Physical Exam Vitals reviewed.  Constitutional:      General: He is awake.     Appearance: Normal appearance.  He is well-developed. He is not ill-appearing.     Comments: Very pleasant male appears stated age in no acute distress sitting comfortably in exam room  HENT:     Head: Normocephalic and atraumatic.     Mouth/Throat:     Pharynx: Uvula midline. No oropharyngeal exudate or posterior oropharyngeal erythema.  Cardiovascular:     Rate and Rhythm: Regular rhythm. Tachycardia present.     Heart sounds: Normal heart sounds, S1 normal and S2 normal. No murmur heard. Pulmonary:     Effort: Pulmonary effort is normal.     Breath sounds: Normal breath sounds. No stridor. No wheezing, rhonchi or rales.  Abdominal:     General: Bowel sounds are normal.     Palpations: Abdomen is soft.     Tenderness: There is abdominal tenderness in the right upper quadrant and epigastric area. There is guarding. There is no right CVA tenderness, left CVA tenderness or rebound. Negative signs include Murphy's sign.  Neurological:     Mental Status: He is alert.  Psychiatric:        Behavior: Behavior is cooperative.      UC Treatments / Results  Labs (all labs ordered are listed, but only abnormal results are displayed) Labs Reviewed - No data to display  EKG   Radiology No results found.  Procedures Procedures (including critical care time)  Medications Ordered in UC Medications - No data to display  Initial Impression / Assessment and Plan / UC Course  I have reviewed the triage vital signs and the nursing  notes.  Pertinent labs & imaging results that were available during my care of the patient were reviewed by me and considered in my medical decision making (see chart for details).     Patient is mildly tachycardic with significant tenderness on exam.  We discussed potential utility of attempting a GI cocktail and obtaining basic labs but seeing as our lab work would take several hours and we do not have any imaging capabilities through shared decision-making patient preferred to go to the emergency room.  This is appropriate given tenderness on exam as he likely would need abdominal imaging which we do not have access to an urgent care.  He will go directly to [facility], ER for further evaluation and management.  He was stable at the time of discharge.  Final Clinical Impressions(s) / UC Diagnoses   Final diagnoses:  Abdominal pain, epigastric  Fever, unspecified  History of pancreatitis   Discharge Instructions   None    ED Prescriptions   None    PDMP not reviewed this encounter.   Jeani Hawking, PA-C 01/05/22 [phone]

## 2022-01-05 NOTE — Discharge Instructions (Addendum)
You will need to schedule an appointment with gastroenterology in order to obtain further follow-up of your pseudocyst on your pancreas.  You were prescribed medication in order to help treat your acute pancreatitis.  Please take 2 capsules with meals and one with snacks.

## 2022-01-05 NOTE — Consult Note (Addendum)
Attending physician's note   I have taken a history, reviewed the chart, and examined the patient. I performed a substantive portion of this encounter, including complete performance of at least one of the key components, in conjunction with the APP. I agree with the APP's note, impression, and recommendations with my edits.   36 year old male with history as below, to include hospital admission in 09/2021 with acute EtOH pancreatitis.  Was evaluated with CT and MRI/MRCP during that admission, as outlined below.  The MRI/MRCP did demonstrate 3.8 x 7.8 x 2.7 cm fluid collection without organization or rim enhancement adjacent to the gastric antrum.  Had been doing relatively well since hospital discharge.  Cut down his EtOH consumption, and last drink was about 3 weeks ago.  Now presents to the ER with epigastric pain, bloating, and abdominal swelling for the last 5 days.  Did have fevers initially, but those symptoms subsided.  ER evaluation notable for CT with severe chronic pancreatitis changes, multiple large pseudocysts with the largest measuring up to 10 cm and located adjacent to the gastric wall.  Some peripancreatic changes which could be consistent with mild acute pancreatitis, along with splenomegaly.  Otherwise normal renal function, no hemoconcentration, normal lipase, no leukocytosis.  Hemodynamically stable.   1) EtOH Pancreatitis 2) Pancreatic pseudocyst 3) Chronic pancreatitis changes on CT 4) Epigastric pain Discussed CT findings with the patient at length today.  Appears to have multiple large pancreatic pseudocyst, with the largest up to 10 cm.  I reviewed those images and appears to be organized with rim enhancement.  I discussed the possibility of pancreatic cyst gastrostomy, which would require thick rind around the pseudocyst.  He is otherwise HD stable, and tolerating p.o. intake  without issue. - Stable for discharge to home - Will trial course of Creon 36,000 unit/capsule.  2 capsules with meals and 1 capsule with snacks - Will try to coordinate outpatient office visit with Dr. Rush Landmark of our advanced biliary service and the only one who performs cystogastrostomy to discussed advanced endoscopic options - Strict avoidance of all EtOH - Discussed ER return precautions   Eric Heck, DO, FACG [phone] office        [facility] Consult: 2:34 PM 01/05/2022  LOS: 0 days    Referring Provider: Dr Doren Custard in ED, Chauncey Cruel PA in ED  Primary Care Physician:  Susy Frizzle, MD Primary Gastroenterologist:  unassigned (Was seen by Dr. Bari Edward durin admission in 09/2021, but no outpatient follow-up)    Reason for Consultation:  abdominal pain.  Large pancreatic pseudocysts.     HPI: Eric Hodge is a 36 y.o. male.  PMH EtOH use disorder.  Alcoholic pancreatitis.  Obesity (118 kg).  ADHD.  Elevated LFTs dating back to at least 2020 with ultrasound then showing fatty liver.  09/19/2021-09/25/2021 admission with acute alcoholic pancreatitis.  Lipase peaked at 395.  T. bili 2.3, alk phos 79, AST/ALT 120/211. 09/19/2021 CTAP w contrast: Diffuse, acute pancreatitis.  Pancreatic parenchyma difficult to assess given background of fatty atrophy with areas of hypoattenuation representing either fatty infiltration or areas of hypoenhancement and possible threatened viability of pancreatic parenchyma.  MRI suggested.  Marked hepatic steatosis.  Mild splenomegaly.  Splenic vein patent. 09/20/2021 MRI abdomen, MRCP: Motion degraded study.  Hepatomegaly, hepatic steatosis.  Diffuse pancreatitis.  Necrosis assessment limited due to motion artifact and diffuse fatty replacement within the pancreas.  3.8 x 7.8 x 2.7 cm fluid collection without organizing or enhancing rim, adjacent  to gastric antrum.  No gallstones, no biliary ductal dilatation.  No  choledocholithiasis.  Presented to ED this morning for evaluation of 3-4/10 intensity epigastric pain, bloating/swelling present for about 5 days.  Pain worse with engagement of abdominal musculature.  If any radiation of pain it is into his back but nothing into his chest or lower abdomen no relief with simethicone, ibuprofen.  Not as intense as with previous episodes of acute pancreatitis.  For a few days prior to onset of pain was having sweats and fever recorded up to 103 F.  Within a couple of days of the onset pain, fevers subsided but pain did not.  Endorses anorexia but no increased pain with PO consumption..  No nausea, vomiting, change in bowel habits.  Patient has cut down on his substantial daily drinking habits but he still does consume alcohol.  Last use was 3 weeks ago when he drank 4 beers.  Other than his Crestor and a daily multivitamin, no other regular meds. Pain improved after 4 mg dose morphine  No fever.  Initial heart rate 109 but normalized into the 80s.  Initial BP 171/91, now normal.  No hypoxia on room air sats.  Lipase 34.  T. bili 0.9.  Alk phos 94.  AST/ALT 25/52.  Potassium 3.  Renal function normal.  Sodium normal. WBCs 9.1.  Hb 13.4.  Platelets 297. CTAP w contrast: changes of severe chronic pancreatitis with diffuse cystic changes and large pseudocysts measuring up to 10 cm.  Peripancreatic inflammatory changes CW a degree of acute pancreatitis.  Splenomegaly.   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  EtOH as above.  Smokes, a pack of cigarettes lasts more than a week. Family history of gallbladder disease on his mother side, she herself has not had gallbladder issues.  No issues with pancreatitis, liver disease or cirrhosis.  Past Medical History:  Diagnosis Date   ADHD (attention deficit hyperactivity disorder)    Allergy    Elevated LFTs    Hyperlipidemia    Retained orthopedic hardware 02/2017   right distal  radius   Runny nose 03/06/2017   clear drainage, per pt.    Past Surgical History:  Procedure Laterality Date   CARPAL TUNNEL RELEASE Right 10/06/2016   Procedure: CARPAL TUNNEL RELEASE;  Surgeon: Leanora Cover, MD;  Location: [place];  Service: Orthopedics;  Laterality: Right;   FOREIGN BODY REMOVAL  09/08/2011   Procedure: REMOVAL FOREIGN BODY EXTREMITY;  Surgeon: Tennis Must, MD;  Location: [place];  Service: Orthopedics;  Laterality: Left;  FOREIGN BODY REMOVAL LEFT LONG FINGER   HARDWARE REMOVAL Right 03/10/2017   Procedure: HARDWARE REMOVAL RIGHT WRIST;  Surgeon: Leanora Cover, MD;  Location: [place];  Service: Orthopedics;  Laterality: Right;   MASS EXCISION Left 10/29/2015   Procedure: LEFT WRIST EXCISION MASS;  Surgeon: Leanora Cover, MD;  Location: [place];  Service: Orthopedics;  Laterality: Left;  LEFT WRIST EXCISION MASS   OPEN REDUCTION INTERNAL FIXATION (ORIF) DISTAL RADIAL FRACTURE Right 10/06/2016   Procedure: OPEN REDUCTION INTERNAL FIXATION (ORIF) RIGHT DISTAL RADIUS;  Surgeon: Leanora Cover, MD;  Location: [place];  Service: Orthopedics;  Laterality: Right;   WISDOM TOOTH EXTRACTION  2012    Prior to Admission medications   Medication Sig Start Date End Date Taking? Authorizing Provider  azelastine (ASTELIN) 0.1 % nasal spray Place 2 sprays into  both nostrils 2 (two) times daily. Use in each nostril as directed 11/01/17   Orlena Sheldon, PA-C  Cetirizine HCl (ZYRTEC ALLERGY) 10 MG CAPS Take 1 capsule (10 mg total) by mouth daily as needed. 11/01/17   Dixon, Stanton Kidney B, PA-C  montelukast (SINGULAIR) 10 MG tablet TAKE 1 TABLET(10 MG) BY MOUTH DAILY AS NEEDED FOR ALLERGIES 11/05/20   Susy Frizzle, MD  omeprazole (PRILOSEC) 20 MG capsule TAKE 1 CAPSULE(20 MG) BY MOUTH DAILY 12/24/19   Susy Frizzle, MD  rosuvastatin (CRESTOR) 10 MG tablet Take 1 tablet (10 mg total) by mouth daily. 12/14/21    Susy Frizzle, MD  testosterone cypionate (DEPOTESTOSTERONE CYPIONATE) 200 MG/ML injection Inject 1 mL (200 mg total) into the muscle every 14 (fourteen) days. 12/14/21   Susy Frizzle, MD    Scheduled Meds:  Infusions:  PRN Meds:    Allergies as of 01/05/2022   (No Known Allergies)    Family History  Problem Relation Age of Onset   Stroke Paternal Grandmother    Hypertension Paternal Grandmother    Cancer Paternal Grandfather    Hyperlipidemia Paternal Grandfather    Coronary artery disease Father 53       At 64 had MI --> stent. This was his 1st dx CAD    Social History   Socioeconomic History   Marital status: Divorced    Spouse name: Not on file   Number of children: Not on file   Years of education: Not on file   Highest education level: Not on file  Occupational History   Not on file  Tobacco Use   Smoking status: Every Day    Packs/day: 0.50    Years: 10.00    Total pack years: 5.00    Types: Cigarettes   Smokeless tobacco: Never  Vaping Use   Vaping Use: Never used  Substance and Sexual Activity   Alcohol use: Yes    Alcohol/week: 6.0 standard drinks of alcohol    Types: 6 Shots of liquor per week    Comment: 4-5 days per week   Drug use: No   Sexual activity: Not Currently  Other Topics Concern   Not on file  Social History Narrative   Not on file   Social Determinants of Health   Financial Resource Strain: Not on file  Food Insecurity: Not on file  Transportation Needs: Not on file  Physical Activity: Not on file  Stress: Not on file  Social Connections: Not on file  Intimate Partner Violence: Not on file    REVIEW OF SYSTEMS: Constitutional: No weakness.  No fatigue. ENT:  No nose bleeds Pulm: No shortness of breath or cough.  No increased pain in abdomen with breathing. CV:  No palpitations, no LE edema.  No angina.  No radiation of epigastric pain to the chest. GU:  No hematuria, no frequency.  No dark urine GI: See  HPI. Heme: No unusual bleeding or bruising Transfusions: None Neuro:  No headaches, no peripheral tingling or numbness.  No lightheadedness, no dizziness, no seizures. Derm:  No itching, no rash or sores.  Endocrine:  No sweats or chills.  No polyuria or dysuria Immunization: Reviewed Travel:  Not   PHYSICAL EXAM: Vital signs in last 24 hours: Vitals:   01/05/22 1238 01/05/22 1330  BP:  125/82  Pulse:  84  Resp:  17  Temp: 98.2 F (36.8 C)   SpO2:  99%   Wt Readings from Last 3 Encounters:  01/05/22 118 kg  12/13/21 118.5 kg  10/05/21 121.3 kg    General: Patient does not look toxic or ill.  Not diaphoretic.  Alert and able to provide excellent history. Head: No facial asymmetry or swelling.  No signs of head trauma. Eyes: No scleral icterus or conjunctival pallor. Ears: No hearing deficit Nose: No congestion or discharge Mouth: Good dentition.  Tongue midline.  Mucosa moist, pink, clear. Neck: No JVD, no masses, no thyromegaly. Lungs: Clear bilaterally with excellent breath sounds.  No labored breathing and no cough. Heart: RRR.  No MRG.  S1, S2 present. Abdomen: Soft.  Obese.  Slight distention.  Do not appreciate organomegaly, masses, hernias, bruits.  Bowel sounds active. Rectal: Deferred Musc/Skeltl: No joint redness, swelling or deformity Extremities: No CCE. Neurologic: Fully alert and oriented.  Excellent historian.  Moves all 4 limbs without tremors.  Strength not tested but no gross weakness. Skin: No telangiectasia, purpura, bruising, suspicious lesions, rash Nodes: No cervical adenopathy Psych: Calm, pleasant, cooperative.  Normal affect.  Fluid speech.  Intake/Output from previous day: No intake/output data recorded. Intake/Output this shift: Total I/O In: 1100 [IV Piggyback:1100] Out: -   LAB RESULTS: Recent Labs    01/05/22 0901  WBC 9.1  HGB 13.4  HCT 38.8*  PLT 297   BMET Lab Results  Component Value Date   NA 137 01/05/2022   NA 139  12/13/2021   NA 139 10/05/2021   K 3.0 (L) 01/05/2022   K 3.9 12/13/2021   K 4.8 10/05/2021   CL 102 01/05/2022   CL 105 12/13/2021   CL 102 10/05/2021   CO2 20 (L) 01/05/2022   CO2 23 12/13/2021   CO2 25 10/05/2021   GLUCOSE 142 (H) 01/05/2022   GLUCOSE 99 12/13/2021   GLUCOSE 110 (H) 10/05/2021   BUN 7 01/05/2022   BUN 13 12/13/2021   BUN 13 10/05/2021   CREATININE 1.05 01/05/2022   CREATININE 0.92 12/13/2021   CREATININE 1.03 10/05/2021   CALCIUM 9.3 01/05/2022   CALCIUM 9.6 12/13/2021   CALCIUM 9.4 10/05/2021   LFT Recent Labs    01/05/22 0901  PROT 7.1  ALBUMIN 3.0*  AST 25  ALT 52*  ALKPHOS 94  BILITOT 0.9   PT/INR No results found for: "INR", "PROTIME" Hepatitis Panel No results for input(s): "HEPBSAG", "HCVAB", "HEPAIGM", "HEPBIGM" in the last 72 hours. C-Diff No components found for: "CDIFF" Lipase     Component Value Date/Time   LIPASE 34 01/05/2022 0901    Drugs of Abuse  No results found for: "LABOPIA", "COCAINSCRNUR", "LABBENZ", "AMPHETMU", "THCU", "LABBARB"   RADIOLOGY STUDIES: CT ABDOMEN PELVIS W CONTRAST  Result Date: 01/05/2022 CLINICAL DATA:  Epigastric pain EXAM: CT ABDOMEN AND PELVIS WITH CONTRAST TECHNIQUE: Multidetector CT imaging of the abdomen and pelvis was performed using the standard protocol following bolus administration of intravenous contrast. RADIATION DOSE REDUCTION: This exam was performed according to the departmental dose-optimization program which includes automated exposure control, adjustment of the mA and/or kV according to patient size and/or use of iterative reconstruction technique. CONTRAST:  100 mL OMNIPAQUE IOHEXOL 350 MG/ML SOLN COMPARISON:  None Available. FINDINGS: Lower chest: Minimal dependent subsegmental atelectasis or scarring left base. No pleural or pericardial effusion abnormality. Hepatobiliary: No focal liver abnormality is seen. Gallbladder is distended. No gallstones, gallbladder wall thickening, or  biliary dilatation. Pancreas: Numerous cystic lesions identified in the pancreas consistent with pseudocysts. The largest is projecting anteriorly involving the wall of the stomach. This measures 10 cm. There  is extensive peripancreatic stranding. Spleen: Enlarged spleen with homogeneous texture measuring 19 cm. Adrenals/Urinary Tract: Adrenal glands are unremarkable. Kidneys are normal, without renal calculi, focal lesion, or hydronephrosis. Bladder is unremarkable. Stomach/Bowel: Stomach is within normal limits. Appendix appears normal. No evidence of bowel wall thickening, distention, or inflammatory changes. Vascular/Lymphatic: No significant vascular findings are present. No enlarged abdominal or pelvic lymph nodes. Reproductive: Prostate is unremarkable. Other: No abdominal wall hernia or abnormality. No abdominopelvic ascites. Musculoskeletal: No acute or significant osseous findings. IMPRESSION: 1. Severe chronic pancreatitis with diffuse cystic changes and large pseudocysts measuring up to 10 cm. Peripancreatic inflammatory changes consistent with some degree of acute pancreatitis as well. 2. Splenomegaly. Electronically Signed   By: Sammie Bench M.D.   On: 01/05/2022 12:39      IMPRESSION:   Prolonged acute abdominal pain for about a week in patient with history EtOH pancreatitis and changes of chronic pancreatitis and large pseudocysts on current CT scan.  Just prior to and early on with the abdominal pain he had fevers.  Currently no fever, no leukocytosis.   ETOH use disorder.  Not completely in remission but, per his report, drinking much less than prior to September.  Last alcohol intake 4 beers 3 weeks ago.  Fatty liver.  Seen on previous imaging.  Not noted on current CT.   PLAN:     At present patient does not appear ill enough to warrant admission.  However he is going to need effective pain management if he is to discharge back home.  Need to have advanced biliary MD, Dr  Rush Landmark eval imaging and decide re drainage of pseudocyst (s).     Azucena Freed  01/05/2022, 2:34 PM Phone [phone]

## 2022-01-05 NOTE — ED Provider Notes (Signed)
  Physical Exam  BP 120/72   Pulse 92   Temp 98.2 F (36.8 C) (Oral)   Resp 15   Ht 5\' 9"  (1.753 m)   Wt 118 kg   SpO2 99%   BMI 38.42 kg/m   Physical Exam Vitals and nursing note reviewed.  Constitutional:      Appearance: He is well-developed.  HENT:     Head: Normocephalic and atraumatic.  Cardiovascular:     Rate and Rhythm: Normal rate.  Pulmonary:     Effort: Pulmonary effort is normal.  Abdominal:     General: Abdomen is flat. Bowel sounds are normal.     Palpations: Abdomen is soft.  Skin:    General: Skin is warm and dry.  Neurological:     Mental Status: He is alert and oriented to person, place, and time.     Procedures  Procedures  ED Course / MDM   Clinical Course as of 01/05/22 1613  Wed Jan 05, 2022  1018 Potassium(!): 3.0 [CA]  1220 Lactic Acid, Venous: 0.9 [CA]  1220 Lipase: 34 [CA]    Clinical Course User Index [CA] Jan 07, 2022, PA-C   Medical Decision Making Amount and/or Complexity of Data Reviewed Labs: ordered. Decision-making details documented in ED Course. Radiology: ordered.  Risk Prescription drug management.   Patient cared assumed from [place]abel a [place] at shift change, please see her note for full HPI.  Briefly, patient here with recurrent pancreatitis, normal lipase.  Nominal pain along with nausea and vomiting but now tolerating p.o. after pain control.  Plan is for GI consultation to determine pseudocyst found on CT imaging.  Spoke to [place] of GI who consulted the case with Dr. Evette Doffing and they recommend creon for outpatient management along with follow up in office. Patient was informed of plan and treatment, hemodynamically stable for discharge.   Portions of this note were generated with Barron Alvine. Dictation errors may occur despite best attempts at proofreading.         Scientist, clinical (histocompatibility and immunogenetics), PA-C 01/05/22 1613    01/07/22, MD 01/05/22 [phone]

## 2022-01-06 ENCOUNTER — Encounter: Payer: Self-pay | Admitting: Physician Assistant

## 2022-01-14 ENCOUNTER — Telehealth: Payer: 59 | Admitting: Family Medicine

## 2022-01-14 DIAGNOSIS — G8929 Other chronic pain: Secondary | ICD-10-CM

## 2022-01-14 NOTE — Progress Notes (Signed)
[place]

## 2022-01-19 ENCOUNTER — Other Ambulatory Visit (INDEPENDENT_AMBULATORY_CARE_PROVIDER_SITE_OTHER): Payer: 59

## 2022-01-19 ENCOUNTER — Ambulatory Visit (INDEPENDENT_AMBULATORY_CARE_PROVIDER_SITE_OTHER): Payer: 59 | Admitting: Nurse Practitioner

## 2022-01-19 ENCOUNTER — Encounter: Payer: Self-pay | Admitting: Nurse Practitioner

## 2022-01-19 VITALS — BP 120/70 | HR 110 | Ht 69.0 in | Wt 248.6 lb

## 2022-01-19 DIAGNOSIS — K860 Alcohol-induced chronic pancreatitis: Secondary | ICD-10-CM | POA: Diagnosis not present

## 2022-01-19 DIAGNOSIS — K863 Pseudocyst of pancreas: Secondary | ICD-10-CM

## 2022-01-19 LAB — COMPREHENSIVE METABOLIC PANEL
ALT: 24 U/L (ref 0–53)
AST: 13 U/L (ref 0–37)
Albumin: 4 g/dL (ref 3.5–5.2)
Alkaline Phosphatase: 90 U/L (ref 39–117)
BUN: 9 mg/dL (ref 6–23)
CO2: 26 mEq/L (ref 19–32)
Calcium: 9.7 mg/dL (ref 8.4–10.5)
Chloride: 103 mEq/L (ref 96–112)
Creatinine, Ser: 0.92 mg/dL (ref 0.40–1.50)
GFR: 108.65 mL/min (ref >60.00)
Glucose, Bld: 113 mg/dL — ABNORMAL HIGH (ref 70–99)
Potassium: 4.4 mEq/L (ref 3.5–5.1)
Sodium: 137 mEq/L (ref 135–145)
Total Bilirubin: 0.3 mg/dL (ref 0.2–1.2)
Total Protein: 7.4 g/dL (ref 6.0–8.3)

## 2022-01-19 LAB — CBC WITH DIFFERENTIAL/PLATELET
Basophils Absolute: 0.1 10*3/uL (ref 0.0–0.1)
Basophils Relative: 0.6 % (ref 0.0–3.0)
Eosinophils Absolute: 0 10*3/uL (ref 0.0–0.7)
Eosinophils Relative: 0.5 % (ref 0.0–5.0)
HCT: 38.9 % — ABNORMAL LOW (ref 39.0–52.0)
Hemoglobin: 13.1 g/dL (ref 13.0–17.0)
Lymphocytes Relative: 10.7 % — ABNORMAL LOW (ref 12.0–46.0)
Lymphs Abs: 1 10*3/uL (ref 0.7–4.0)
MCHC: 33.8 g/dL (ref 30.0–36.0)
MCV: 91.8 fl (ref 78.0–100.0)
Monocytes Absolute: 0.5 10*3/uL (ref 0.1–1.0)
Monocytes Relative: 5.9 % (ref 3.0–12.0)
Neutro Abs: 7.4 10*3/uL (ref 1.4–7.7)
Neutrophils Relative %: 82.3 % — ABNORMAL HIGH (ref 43.0–77.0)
Platelets: 212 10*3/uL (ref 150.0–400.0)
RBC: 4.24 Mil/uL (ref 4.22–5.81)
RDW: 14.8 % (ref 11.5–15.5)
WBC: 9 10*3/uL (ref 4.0–10.5)

## 2022-01-19 LAB — LIPASE: Lipase: 14 U/L (ref 11.0–59.0)

## 2022-01-19 MED ORDER — TRAMADOL HCL 50 MG PO TABS: 50.0000 mg | ORAL_TABLET | Freq: Three times a day (TID) | ORAL | 0 refills | Status: DC | PRN

## 2022-01-19 NOTE — Progress Notes (Signed)
Assessment    Patient profile:  Eric Hodge is a 37 y.o. year old male known to Dr. Silverio Decamp ( hospitalization) with a past medical history of Etoh abuse, Etoh pancreatitis, obesity, tobacco abuse.  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 No Etoh use in one month now. Having worsening upper abdominal discomfort / left flank pain. Pain likely secondary to acute on chronic pancreatitis and possibly pseudocysts.    Plan:    CBC, CMET, lipase Continue to avoid Etoh Low fat diet Continue PERT Will give Ultram 50 mg Q 8 hours for pain  Repeat CT scan with contrast in 4 weeks to re-evaluate pseudocysts.I will discuss with our advanced biliary endoscopist Dr. Rush Landmark as to whether cystogastrostomy is needed.  .  In the interim advised to go to ED for fevers or worsening pain  HPI:    Chief Complaint: hospital follow up   Patient is a 37 yo male who we saw in the hospital in Sept 2023 for etoh pancreatitis. He got better but pain recurred in late December. He also developed fever of 103 degrees. He went to the ED and we saw him there for consultation for severe pancreatitis.  ED course: Labs in ED : normal WBC HCT 38%. Renal function normal. K+ low ( repleted in ED). ALT 52.   CT scan 12/20 in ED showed severe chronic pancreatitis with associated pseudocysts measuring up to 10 cm, peripancreatic inflammatory changes consistent with some degree of acute pancreatitis as well and splenomegaly.    Started on Creon. Told to avoid Etoh. Our plan was to discuss a cyst gastrostomy with our advanced bilary endoscopist Dr. Rush Landmark. I do not see any follow up notes regarding the plan but he is here for ED follow up.   Interval history: Appetite is okay. Creon initially helped postprandial pain but now having more generalized upper abdominal  pain over last few days. The pain radiates through to his back and into left flank as well. No significant nausea. BMs normal.   Previous Labs / Imaging::    Latest Ref Rng & Units 01/05/2022    9:01 AM 12/13/2021    3:18 PM 10/05/2021   12:23 PM  CBC  WBC 4.0 - 10.5 K/uL 9.1  8.3  10.0   Hemoglobin 13.0 - 17.0 g/dL 13.4  15.3  14.4   Hematocrit 39.0 - 52.0 % 38.8  43.2  41.3   Platelets 150 - 400 K/uL 297  219  449     Lab Results  Component Value Date   LIPASE 34 01/05/2022      Latest Ref Rng & Units 01/05/2022    9:01 AM 12/13/2021    3:18 PM 10/05/2021   12:23 PM  CMP  Glucose 70 - 99 mg/dL 142  99  110   BUN 6 - 20 mg/dL 7  13  13    Creatinine 0.61 - 1.24 mg/dL 1.05  0.92  1.03   Sodium 135 - 145 mmol/L 137  139  139   Potassium 3.5 - 5.1 mmol/L 3.0  3.9  4.8   Chloride 98 - 111 mmol/L 102  105  102   CO2 22 - 32 mmol/L 20  23  25    Calcium 8.9 - 10.3 mg/dL 9.3  9.6  9.4   Total Protein 6.5 -  8.1 g/dL 7.1  7.6  6.7   Total Bilirubin 0.3 - 1.2 mg/dL 0.9  0.7  0.4   Alkaline Phos 38 - 126 U/L 94     AST 15 - 41 U/L 25  23  23    ALT 0 - 44 U/L 52  52  30     Imaging:  CT ABDOMEN PELVIS W CONTRAST CLINICAL DATA:  Epigastric pain  EXAM: CT ABDOMEN AND PELVIS WITH CONTRAST  TECHNIQUE: Multidetector CT imaging of the abdomen and pelvis was performed using the standard protocol following bolus administration of intravenous contrast.  RADIATION DOSE REDUCTION: This exam was performed according to the departmental dose-optimization program which includes automated exposure control, adjustment of the mA and/or kV according to patient size and/or use of iterative reconstruction technique.  CONTRAST:  100 mL OMNIPAQUE IOHEXOL 350 MG/ML SOLN  COMPARISON:  None Available.  FINDINGS: Lower chest: Minimal dependent subsegmental atelectasis or scarring left base. No pleural or pericardial effusion abnormality.  Hepatobiliary: No focal liver abnormality is seen.  Gallbladder is distended. No gallstones, gallbladder wall thickening, or biliary dilatation.  Pancreas: Numerous cystic lesions identified in the pancreas consistent with pseudocysts. The largest is projecting anteriorly involving the wall of the stomach. This measures 10 cm. There is extensive peripancreatic stranding.  Spleen: Enlarged spleen with homogeneous texture measuring 19 cm.  Adrenals/Urinary Tract: Adrenal glands are unremarkable. Kidneys are normal, without renal calculi, focal lesion, or hydronephrosis. Bladder is unremarkable.  Stomach/Bowel: Stomach is within normal limits. Appendix appears normal. No evidence of bowel wall thickening, distention, or inflammatory changes.  Vascular/Lymphatic: No significant vascular findings are present. No enlarged abdominal or pelvic lymph nodes.  Reproductive: Prostate is unremarkable.  Other: No abdominal wall hernia or abnormality. No abdominopelvic ascites.  Musculoskeletal: No acute or significant osseous findings.  IMPRESSION: 1. Severe chronic pancreatitis with diffuse cystic changes and large pseudocysts measuring up to 10 cm. Peripancreatic inflammatory changes consistent with some degree of acute pancreatitis as well. 2. Splenomegaly.  Electronically Signed   By: Sammie Bench M.D.   On: 01/05/2022 12:39    Past Medical History:  Diagnosis Date   ADHD (attention deficit hyperactivity disorder)    Alcohol use disorder 09/2021   Elevated LFTs    Hyperlipidemia    Pancreatitis, alcoholic, acute 19/5093   Retained orthopedic hardware 02/2017   right distal radius   Past Surgical History:  Procedure Laterality Date   CARPAL TUNNEL RELEASE Right 10/06/2016   Procedure: CARPAL TUNNEL RELEASE;  Surgeon: Leanora Cover, MD;  Location: [place];  Service: Orthopedics;  Laterality: Right;   FOREIGN BODY REMOVAL  09/08/2011   Procedure: REMOVAL FOREIGN BODY EXTREMITY;  Surgeon: Tennis Must, MD;   Location: [place];  Service: Orthopedics;  Laterality: Left;  FOREIGN BODY REMOVAL LEFT LONG FINGER   HARDWARE REMOVAL Right 03/10/2017   Procedure: HARDWARE REMOVAL RIGHT WRIST;  Surgeon: Leanora Cover, MD;  Location: [place];  Service: Orthopedics;  Laterality: Right;   MASS EXCISION Left 10/29/2015   Procedure: LEFT WRIST EXCISION MASS;  Surgeon: Leanora Cover, MD;  Location: [place];  Service: Orthopedics;  Laterality: Left;  LEFT WRIST EXCISION MASS   OPEN REDUCTION INTERNAL FIXATION (ORIF) DISTAL RADIAL FRACTURE Right 10/06/2016   Procedure: OPEN REDUCTION INTERNAL FIXATION (ORIF) RIGHT DISTAL RADIUS;  Surgeon: Leanora Cover, MD;  Location: [place];  Service: Orthopedics;  Laterality: Right;   [place] EXTRACTION  2012   Family  History  Problem Relation Age of Onset   Stroke Paternal Grandmother    Hypertension Paternal Grandmother    Cancer Paternal Grandfather    Hyperlipidemia Paternal Grandfather    Coronary artery disease Father 70       At 46 had MI --> stent. This was his 1st dx CAD   Social History   Tobacco Use   Smoking status: Every Day    Packs/day: 0.50    Years: 10.00    Total pack years: 5.00    Types: Cigarettes   Smokeless tobacco: Never  Vaping Use   Vaping Use: Never used  Substance Use Topics   Alcohol use: Yes    Alcohol/week: 6.0 standard drinks of alcohol    Types: 6 Shots of liquor per week    Comment: 4-5 days per week   Drug use: No   Current Outpatient Medications  Medication Sig Dispense Refill   lipase/protease/amylase (CREON) 36000 UNITS CPEP capsule Take 2 capsules (72,000 Units total) by mouth 2 (two) times daily with a meal. May also take 1 capsule (36,000 Units total) as needed (with snacks). 240 capsule 11   omeprazole (PRILOSEC) 20 MG capsule TAKE 1 CAPSULE(20 MG) BY MOUTH DAILY 30 capsule 11   rosuvastatin (CRESTOR) 10 MG tablet Take 1 tablet (10 mg total) by mouth  daily. 90 tablet 3   testosterone cypionate (DEPOTESTOSTERONE CYPIONATE) 200 MG/ML injection Inject 1 mL (200 mg total) into the muscle every 14 (fourteen) days. 6 mL 0   azelastine (ASTELIN) 0.1 % nasal spray Place 2 sprays into both nostrils 2 (two) times daily. Use in each nostril as directed (Patient not taking: Reported on 01/19/2022) 30 mL 12   Cetirizine HCl (ZYRTEC ALLERGY) 10 MG CAPS Take 1 capsule (10 mg total) by mouth daily as needed. (Patient not taking: Reported on 01/19/2022) 90 capsule 3   montelukast (SINGULAIR) 10 MG tablet TAKE 1 TABLET(10 MG) BY MOUTH DAILY AS NEEDED FOR ALLERGIES (Patient not taking: Reported on 01/19/2022) 30 tablet 2   No current facility-administered medications for this visit.   No Known Allergies   Review of Systems: All other systems reviewed and negative except where noted in HPI.   Wt Readings from Last 3 Encounters:  01/19/22 248 lb 9.6 oz (112.8 kg)  01/05/22 260 lb 2.3 oz (118 kg)  12/13/21 261 lb 3.2 oz (118.5 kg)    Physical Exam   BP 120/70   Pulse (!) 110   Ht 5\' 9"  (1.753 m)   Wt 248 lb 9.6 oz (112.8 kg)   BMI 36.71 kg/m  Constitutional:  Generally well appearing in obvious physical discomfort.  Psychiatric: Pleasant. Normal mood and affect. Behavior is normal. EENT: Pupils normal.  Conjunctivae are normal. No scleral icterus. Neck supple.  Cardiovascular: Normal rate, regular rhythm.  Pulmonary/chest: Effort normal and breath sounds normal. No wheezing, rales or rhonchi. Abdominal: Soft, nondistended, moderate LUQ / epigastric tenderness.  Bowel sounds active throughout. LUQ fullness hepatomegaly. Neurological: Alert and oriented to person place and time. Extremities: No edema Skin: Skin is warm and dry. No rashes noted.  Tye Savoy, NP  01/19/2022, 10:19 AM  Cc:  Referring Provider Susy Frizzle, MD

## 2022-01-19 NOTE — Patient Instructions (Addendum)
We have sent the following medications to your pharmacy for you to pick up at your convenience:  Tramadol . Take 1 tablet every 8 hours as needed for pain.  Your provider has requested that you go to the basement level for lab work before leaving today. Press "B" on the elevator. The lab is located at the first door on the left as you exit the elevator.  You have been scheduled for a CT scan of the abdomen and pelvis at [facility] ([place], [place], [place] [address]).   You are scheduled on 02/21/22 at 10:30 am. You should arrive 8:00 AM prior to your appointment time for registration and drink Contrast. Please follow the written instructions below on the day of your exam:  WARNING: IF YOU ARE ALLERGIC TO IODINE/X-RAY DYE, PLEASE NOTIFY RADIOLOGY IMMEDIATELY AT [phone]! YOU WILL BE GIVEN A 13 HOUR PREMEDICATION PREP.  1) Do not eat or drink anything after 6:30 am (4 hours prior to your test) 2) You have been given 2 bottles of oral contrast to drink. The solution may taste better if refrigerated, but do NOT add ice or any other liquid to this solution. Shake well before drinking.     The purpose of you drinking the oral contrast is to aid in the visualization of your intestinal tract. The contrast solution may cause some diarrhea. Depending on your individual set of symptoms, you may also receive an intravenous injection of x-ray contrast/dye. Plan on being at [facility] for 30 minutes or longer, depending on the type of exam you are having performed.  This test typically takes 30-45 minutes to complete.  If you have any questions regarding your exam or if you need to reschedule, you may call the CT department at [phone] between the hours of 8:00 am and 5:00 pm, Monday-Friday.  Continue Creon.  Follow Low Fat Diet.  Due to recent changes in healthcare laws, you may see the results of your imaging and laboratory studies on MyChart before your provider has had  a chance to review them.  We understand that in some cases there may be results that are confusing or concerning to you. Not all laboratory results come back in the same time frame and the provider may be waiting for multiple results in order to interpret others.  Please give [place] 48 hours in order for your provider to thoroughly review all the results before contacting the office for clarification of your results.    Thank you for choosing me and [facility].  Tye Savoy, NP.     ________________________________________________________________________       If you are age 75 or younger, your body mass index should be between 19-25. Your Body mass index is 36.71 kg/m. If this is out of the aformentioned range listed, please consider follow up with your Primary Care Provider.   __________________________________________________________  The [facility] providers would like to encourage you to use [facility] to communicate with providers for non-urgent requests or questions.  Due to long hold times on the telephone, sending your provider a message by [facility] may be a faster and more efficient way to get a response.  Please allow 48 business hours for a response.  Please remember that this is for non-urgent requests.

## 2022-01-19 NOTE — H&P (View-Only) (Signed)
Assessment    Patient profile:  Eric Hodge is a 37 y.o. year old male known to Dr. Silverio Decamp ( hospitalization) with a past medical history of Etoh abuse, Etoh pancreatitis, obesity, tobacco abuse.  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 No Etoh use in one month now. Having worsening upper abdominal discomfort / left flank pain. Pain likely secondary to acute on chronic pancreatitis and possibly pseudocysts.    Plan:    CBC, CMET, lipase Continue to avoid Etoh Low fat diet Continue PERT Will give Ultram 50 mg Q 8 hours for pain  Repeat CT scan with contrast in 4 weeks to re-evaluate pseudocysts.I will discuss with our advanced biliary endoscopist Dr. Rush Landmark as to whether cystogastrostomy is needed.  .  In the interim advised to go to ED for fevers or worsening pain  HPI:    Chief Complaint: hospital follow up   Patient is a 37 yo male who we saw in the hospital in Sept 2023 for etoh pancreatitis. He got better but pain recurred in late December. He also developed fever of 103 degrees. He went to the ED and we saw him there for consultation for severe pancreatitis.  ED course: Labs in ED : normal WBC HCT 38%. Renal function normal. K+ low ( repleted in ED). ALT 52.   CT scan 12/20 in ED showed severe chronic pancreatitis with associated pseudocysts measuring up to 10 cm, peripancreatic inflammatory changes consistent with some degree of acute pancreatitis as well and splenomegaly.    Started on Creon. Told to avoid Etoh. Our plan was to discuss a cyst gastrostomy with our advanced bilary endoscopist Dr. Rush Landmark. I do not see any follow up notes regarding the plan but he is here for ED follow up.   Interval history: Appetite is okay. Creon initially helped postprandial pain but now having more generalized upper abdominal  pain over last few days. The pain radiates through to his back and into left flank as well. No significant nausea. BMs normal.   Previous Labs / Imaging::    Latest Ref Rng & Units 01/05/2022    9:01 AM 12/13/2021    3:18 PM 10/05/2021   12:23 PM  CBC  WBC 4.0 - 10.5 K/uL 9.1  8.3  10.0   Hemoglobin 13.0 - 17.0 g/dL 13.4  15.3  14.4   Hematocrit 39.0 - 52.0 % 38.8  43.2  41.3   Platelets 150 - 400 K/uL 297  219  449     Lab Results  Component Value Date   LIPASE 34 01/05/2022      Latest Ref Rng & Units 01/05/2022    9:01 AM 12/13/2021    3:18 PM 10/05/2021   12:23 PM  CMP  Glucose 70 - 99 mg/dL 142  99  110   BUN 6 - 20 mg/dL 7  13  13    Creatinine 0.61 - 1.24 mg/dL 1.05  0.92  1.03   Sodium 135 - 145 mmol/L 137  139  139   Potassium 3.5 - 5.1 mmol/L 3.0  3.9  4.8   Chloride 98 - 111 mmol/L 102  105  102   CO2 22 - 32 mmol/L 20  23  25    Calcium 8.9 - 10.3 mg/dL 9.3  9.6  9.4   Total Protein 6.5 -  8.1 g/dL 7.1  7.6  6.7   Total Bilirubin 0.3 - 1.2 mg/dL 0.9  0.7  0.4   Alkaline Phos 38 - 126 U/L 94     AST 15 - 41 U/L 25  23  23    ALT 0 - 44 U/L 52  52  30     Imaging:  CT ABDOMEN PELVIS W CONTRAST CLINICAL DATA:  Epigastric pain  EXAM: CT ABDOMEN AND PELVIS WITH CONTRAST  TECHNIQUE: Multidetector CT imaging of the abdomen and pelvis was performed using the standard protocol following bolus administration of intravenous contrast.  RADIATION DOSE REDUCTION: This exam was performed according to the departmental dose-optimization program which includes automated exposure control, adjustment of the mA and/or kV according to patient size and/or use of iterative reconstruction technique.  CONTRAST:  100 mL OMNIPAQUE IOHEXOL 350 MG/ML SOLN  COMPARISON:  None Available.  FINDINGS: Lower chest: Minimal dependent subsegmental atelectasis or scarring left base. No pleural or pericardial effusion abnormality.  Hepatobiliary: No focal liver abnormality is seen.  Gallbladder is distended. No gallstones, gallbladder wall thickening, or biliary dilatation.  Pancreas: Numerous cystic lesions identified in the pancreas consistent with pseudocysts. The largest is projecting anteriorly involving the wall of the stomach. This measures 10 cm. There is extensive peripancreatic stranding.  Spleen: Enlarged spleen with homogeneous texture measuring 19 cm.  Adrenals/Urinary Tract: Adrenal glands are unremarkable. Kidneys are normal, without renal calculi, focal lesion, or hydronephrosis. Bladder is unremarkable.  Stomach/Bowel: Stomach is within normal limits. Appendix appears normal. No evidence of bowel wall thickening, distention, or inflammatory changes.  Vascular/Lymphatic: No significant vascular findings are present. No enlarged abdominal or pelvic lymph nodes.  Reproductive: Prostate is unremarkable.  Other: No abdominal wall hernia or abnormality. No abdominopelvic ascites.  Musculoskeletal: No acute or significant osseous findings.  IMPRESSION: 1. Severe chronic pancreatitis with diffuse cystic changes and large pseudocysts measuring up to 10 cm. Peripancreatic inflammatory changes consistent with some degree of acute pancreatitis as well. 2. Splenomegaly.  Electronically Signed   By: Sammie Bench M.D.   On: 01/05/2022 12:39    Past Medical History:  Diagnosis Date   ADHD (attention deficit hyperactivity disorder)    Alcohol use disorder 09/2021   Elevated LFTs    Hyperlipidemia    Pancreatitis, alcoholic, acute 19/5093   Retained orthopedic hardware 02/2017   right distal radius   Past Surgical History:  Procedure Laterality Date   CARPAL TUNNEL RELEASE Right 10/06/2016   Procedure: CARPAL TUNNEL RELEASE;  Surgeon: Leanora Cover, MD;  Location: [place];  Service: Orthopedics;  Laterality: Right;   FOREIGN BODY REMOVAL  09/08/2011   Procedure: REMOVAL FOREIGN BODY EXTREMITY;  Surgeon: Tennis Must, MD;   Location: [place];  Service: Orthopedics;  Laterality: Left;  FOREIGN BODY REMOVAL LEFT LONG FINGER   HARDWARE REMOVAL Right 03/10/2017   Procedure: HARDWARE REMOVAL RIGHT WRIST;  Surgeon: Leanora Cover, MD;  Location: [place];  Service: Orthopedics;  Laterality: Right;   MASS EXCISION Left 10/29/2015   Procedure: LEFT WRIST EXCISION MASS;  Surgeon: Leanora Cover, MD;  Location: [place];  Service: Orthopedics;  Laterality: Left;  LEFT WRIST EXCISION MASS   OPEN REDUCTION INTERNAL FIXATION (ORIF) DISTAL RADIAL FRACTURE Right 10/06/2016   Procedure: OPEN REDUCTION INTERNAL FIXATION (ORIF) RIGHT DISTAL RADIUS;  Surgeon: Leanora Cover, MD;  Location: [place];  Service: Orthopedics;  Laterality: Right;   [place] EXTRACTION  2012   Family  History  Problem Relation Age of Onset   Stroke Paternal Grandmother    Hypertension Paternal Grandmother    Cancer Paternal Grandfather    Hyperlipidemia Paternal Grandfather    Coronary artery disease Father 70       At 48 had MI --> stent. This was his 1st dx CAD   Social History   Tobacco Use   Smoking status: Every Day    Packs/day: 0.50    Years: 10.00    Total pack years: 5.00    Types: Cigarettes   Smokeless tobacco: Never  Vaping Use   Vaping Use: Never used  Substance Use Topics   Alcohol use: Yes    Alcohol/week: 6.0 standard drinks of alcohol    Types: 6 Shots of liquor per week    Comment: 4-5 days per week   Drug use: No   Current Outpatient Medications  Medication Sig Dispense Refill   lipase/protease/amylase (CREON) 36000 UNITS CPEP capsule Take 2 capsules (72,000 Units total) by mouth 2 (two) times daily with a meal. May also take 1 capsule (36,000 Units total) as needed (with snacks). 240 capsule 11   omeprazole (PRILOSEC) 20 MG capsule TAKE 1 CAPSULE(20 MG) BY MOUTH DAILY 30 capsule 11   rosuvastatin (CRESTOR) 10 MG tablet Take 1 tablet (10 mg total) by mouth  daily. 90 tablet 3   testosterone cypionate (DEPOTESTOSTERONE CYPIONATE) 200 MG/ML injection Inject 1 mL (200 mg total) into the muscle every 14 (fourteen) days. 6 mL 0   azelastine (ASTELIN) 0.1 % nasal spray Place 2 sprays into both nostrils 2 (two) times daily. Use in each nostril as directed (Patient not taking: Reported on 01/19/2022) 30 mL 12   Cetirizine HCl (ZYRTEC ALLERGY) 10 MG CAPS Take 1 capsule (10 mg total) by mouth daily as needed. (Patient not taking: Reported on 01/19/2022) 90 capsule 3   montelukast (SINGULAIR) 10 MG tablet TAKE 1 TABLET(10 MG) BY MOUTH DAILY AS NEEDED FOR ALLERGIES (Patient not taking: Reported on 01/19/2022) 30 tablet 2   No current facility-administered medications for this visit.   No Known Allergies   Review of Systems: All other systems reviewed and negative except where noted in HPI.   Wt Readings from Last 3 Encounters:  01/19/22 248 lb 9.6 oz (112.8 kg)  01/05/22 260 lb 2.3 oz (118 kg)  12/13/21 261 lb 3.2 oz (118.5 kg)    Physical Exam   BP 120/70   Pulse (!) 110   Ht 5\' 9"  (1.753 m)   Wt 248 lb 9.6 oz (112.8 kg)   BMI 36.71 kg/m  Constitutional:  Generally well appearing in obvious physical discomfort.  Psychiatric: Pleasant. Normal mood and affect. Behavior is normal. EENT: Pupils normal.  Conjunctivae are normal. No scleral icterus. Neck supple.  Cardiovascular: Normal rate, regular rhythm.  Pulmonary/chest: Effort normal and breath sounds normal. No wheezing, rales or rhonchi. Abdominal: Soft, nondistended, moderate LUQ / epigastric tenderness.  Bowel sounds active throughout. LUQ fullness hepatomegaly. Neurological: Alert and oriented to person place and time. Extremities: No edema Skin: Skin is warm and dry. No rashes noted.  Tye Savoy, NP  01/19/2022, 10:19 AM  Cc:  Referring Provider Susy Frizzle, MD

## 2022-01-21 NOTE — Progress Notes (Signed)
Attending Physician's Attestation   I have reviewed the chart.   I agree with the Advanced Practitioner's note, impression, and recommendations with any updates as below.  From a pancreaticobiliary standpoint, the largest pseudocyst look to be adequate for potential pseudocyst gastrostomy but the other cysts were still in maturation process.  Would recommend if he is still doing unwell for updated imaging (pancreas protocol CT abdomen) in the next 1 to 2 weeks and potential cystgastrostomy set up for me as available in the coming weeks as I suspect he will need at least 1 AXIOS stent.  Will forward to my team to help on arranging though my schedule is such that this will likely be in February as an outpatient.  Justice Britain, MD [facility] Office # [phone]

## 2022-01-24 ENCOUNTER — Telehealth: Payer: Self-pay

## 2022-01-24 NOTE — Telephone Encounter (Signed)
The pt has been advised that his CT needs to be moved up a couple weeks.  I have sent a message to the schedulers and provided the pt with the phone number to call

## 2022-01-24 NOTE — Telephone Encounter (Signed)
-----   Message from Willia Craze, NP sent at 01/21/2022  3:47 PM EST ----- Eric Hodge in clinic we scheduled the CT scan for 4 weeks just needs to be moved up.  Thanks   ----- Message ----- From: Irving Copas., MD Sent: 01/21/2022   3:19 AM EST To: Timothy Lasso, RN; Willia Craze, NP  Lets plan to get a pancreas protocol CT abdomen in the next 2 weeks. Would plan to get patient on the books for a February cystgastrostomy EUS pending these findings. Thanks. GM

## 2022-02-03 ENCOUNTER — Ambulatory Visit: Payer: 59 | Admitting: Physician Assistant

## 2022-02-04 ENCOUNTER — Ambulatory Visit (HOSPITAL_COMMUNITY)
Admission: RE | Admit: 2022-02-04 | Discharge: 2022-02-04 | Disposition: A | Discharge status: Home / Self Care | Payer: 59 | Source: Ambulatory Visit | Attending: Nurse Practitioner | Admitting: Nurse Practitioner

## 2022-02-04 DIAGNOSIS — K863 Pseudocyst of pancreas: Secondary | ICD-10-CM | POA: Insufficient documentation

## 2022-02-04 DIAGNOSIS — K860 Alcohol-induced chronic pancreatitis: Secondary | ICD-10-CM | POA: Diagnosis present

## 2022-02-04 MED ORDER — IOHEXOL 9 MG/ML PO SOLN: 500.0000 mL | ORAL | Status: AC

## 2022-02-04 MED ORDER — IOHEXOL 300 MG/ML  SOLN
100.0000 mL | Freq: Once | INTRAMUSCULAR | Status: AC | PRN
  Administered 2022-02-04: 100 mL via INTRAVENOUS

## 2022-02-04 MED ORDER — IOHEXOL 9 MG/ML PO SOLN
ORAL | Status: AC
  Filled 2022-02-04: qty 1000

## 2022-02-04 MED ORDER — SODIUM CHLORIDE (PF) 0.9 % IJ SOLN
INTRAMUSCULAR | Status: AC
  Filled 2022-02-04: qty 50

## 2022-02-09 ENCOUNTER — Other Ambulatory Visit: Payer: Self-pay | Admitting: Family Medicine

## 2022-02-10 MED ORDER — TESTOSTERONE CYPIONATE 200 MG/ML IM SOLN: 200.0000 mg | INTRAMUSCULAR | 0 refills | Status: DC

## 2022-02-11 ENCOUNTER — Other Ambulatory Visit: Payer: Self-pay | Admitting: Nurse Practitioner

## 2022-02-11 ENCOUNTER — Other Ambulatory Visit: Payer: Self-pay

## 2022-02-11 ENCOUNTER — Telehealth: Payer: Self-pay

## 2022-02-11 DIAGNOSIS — K863 Pseudocyst of pancreas: Secondary | ICD-10-CM

## 2022-02-11 NOTE — Telephone Encounter (Signed)
-----  Message from Irving Copas., MD sent at 02/10/2022  5:40 PM EST ----- PG, Thanks for the update.  Jami Ohlin, Agree with moving forward with scheduling for pancreatic cystgastrostomy. If he is willing to move forward with procedure next week (Thursday), on the day that we have a recent cancellation, then please move forward with this patient being scheduled otherwise, he can use one of our held/urgent slots in February. Thanks. GM ----- Message ----- From: Willia Craze, NP Sent: 02/10/2022   4:17 PM EST To: Timothy Lasso, RN; Irving Copas., MD  Jessie Foot called the patient.  He still has pain depending on what he eats.  He also has pain in his back.  He rates the pain about a 4 out of 10..  Given this I am forwarding to Inza Mikrut to get him scheduled for a cyst gastrostomy.  Lendora Keys, please make sure that he is not drinking alcohol these days.  Thanks

## 2022-02-11 NOTE — Telephone Encounter (Signed)
Can you confirm pancreatic cystogastrostomy? Is this ERCP? EUS?  Have not had it stated that way in the past and I want to make sure I set up the correct procedure.

## 2022-02-11 NOTE — Telephone Encounter (Signed)
EUS has been scheduled for 2/1 at 830 am at [facility] with GM   EUS scheduled, pt instructed and medications reviewed.  Patient instructions mailed to home.  Patient to call with any questions or concerns.

## 2022-02-11 NOTE — Telephone Encounter (Signed)
My apologies Patty. EUS cystgastrostomy. No ERCP. If the patient cannot come that day I may have another patient. Let me know what he states he may be able to do. Thanks. GM

## 2022-02-15 ENCOUNTER — Encounter (HOSPITAL_COMMUNITY): Payer: Self-pay | Admitting: Gastroenterology

## 2022-02-16 NOTE — Anesthesia Preprocedure Evaluation (Addendum)
Anesthesia Evaluation  Patient identified by MRN, date of birth, ID band Patient awake    Reviewed: Allergy & Precautions, H&P , NPO status , Patient's Chart, lab work & pertinent test results  Airway Mallampati: I   Neck ROM: full    Dental  (+) Teeth Intact, Dental Advisory Given   Pulmonary Current Smoker   breath sounds clear to auscultation       Cardiovascular negative cardio ROS  Rhythm:regular Rate:Normal     Neuro/Psych  PSYCHIATRIC DISORDERS         GI/Hepatic ,GERD  ,,  Endo/Other  obese  Renal/GU Renal disease     Musculoskeletal   Abdominal   Peds  Hematology   Anesthesia Other Findings   Reproductive/Obstetrics                             Anesthesia Physical Anesthesia Plan  ASA: 2  Anesthesia Plan: General   Post-op Pain Management:  Regional for Post-op pain   Induction: Intravenous  PONV Risk Score and Plan: 1 and Ondansetron, Dexamethasone, Midazolam and Treatment may vary due to age or medical condition  Airway Management Planned: Oral ETT  Additional Equipment: None  Intra-op Plan:   Post-operative Plan: Extubation in OR  Informed Consent: I have reviewed the patients History and Physical, chart, labs and discussed the procedure including the risks, benefits and alternatives for the proposed anesthesia with the patient or authorized representative who has indicated his/her understanding and acceptance.       Plan Discussed with: CRNA and Anesthesiologist  Anesthesia Plan Comments: (  )        Anesthesia Quick Evaluation

## 2022-02-17 ENCOUNTER — Ambulatory Visit (HOSPITAL_COMMUNITY): Payer: 59 | Admitting: Anesthesiology

## 2022-02-17 ENCOUNTER — Encounter (HOSPITAL_COMMUNITY): Payer: Self-pay | Admitting: Gastroenterology

## 2022-02-17 ENCOUNTER — Ambulatory Visit (HOSPITAL_BASED_OUTPATIENT_CLINIC_OR_DEPARTMENT_OTHER): Payer: 59 | Admitting: Anesthesiology

## 2022-02-17 ENCOUNTER — Ambulatory Visit (HOSPITAL_COMMUNITY)
Admission: RE | Admit: 2022-02-17 | Discharge: 2022-02-17 | Disposition: A | Discharge status: Home / Self Care | Payer: 59 | Attending: Gastroenterology | Admitting: Gastroenterology

## 2022-02-17 ENCOUNTER — Encounter (HOSPITAL_COMMUNITY)
Admission: RE | Disposition: A | Discharge status: Home / Self Care | Payer: Self-pay | Source: Home / Self Care | Attending: Gastroenterology

## 2022-02-17 DIAGNOSIS — K859 Acute pancreatitis without necrosis or infection, unspecified: Secondary | ICD-10-CM | POA: Diagnosis present

## 2022-02-17 DIAGNOSIS — F17210 Nicotine dependence, cigarettes, uncomplicated: Secondary | ICD-10-CM | POA: Diagnosis not present

## 2022-02-17 DIAGNOSIS — K766 Portal hypertension: Secondary | ICD-10-CM

## 2022-02-17 DIAGNOSIS — K863 Pseudocyst of pancreas: Secondary | ICD-10-CM

## 2022-02-17 DIAGNOSIS — K2289 Other specified disease of esophagus: Secondary | ICD-10-CM | POA: Insufficient documentation

## 2022-02-17 DIAGNOSIS — K2980 Duodenitis without bleeding: Secondary | ICD-10-CM | POA: Insufficient documentation

## 2022-02-17 DIAGNOSIS — K862 Cyst of pancreas: Secondary | ICD-10-CM | POA: Diagnosis not present

## 2022-02-17 DIAGNOSIS — K3189 Other diseases of stomach and duodenum: Secondary | ICD-10-CM

## 2022-02-17 DIAGNOSIS — K219 Gastro-esophageal reflux disease without esophagitis: Secondary | ICD-10-CM | POA: Insufficient documentation

## 2022-02-17 DIAGNOSIS — E669 Obesity, unspecified: Secondary | ICD-10-CM | POA: Diagnosis not present

## 2022-02-17 DIAGNOSIS — R161 Splenomegaly, not elsewhere classified: Secondary | ICD-10-CM | POA: Insufficient documentation

## 2022-02-17 DIAGNOSIS — K2950 Unspecified chronic gastritis without bleeding: Secondary | ICD-10-CM | POA: Insufficient documentation

## 2022-02-17 DIAGNOSIS — K860 Alcohol-induced chronic pancreatitis: Secondary | ICD-10-CM | POA: Diagnosis not present

## 2022-02-17 DIAGNOSIS — I899 Noninfective disorder of lymphatic vessels and lymph nodes, unspecified: Secondary | ICD-10-CM | POA: Diagnosis not present

## 2022-02-17 DIAGNOSIS — Z6834 Body mass index (BMI) 34.0-34.9, adult: Secondary | ICD-10-CM | POA: Insufficient documentation

## 2022-02-17 DIAGNOSIS — N289 Disorder of kidney and ureter, unspecified: Secondary | ICD-10-CM

## 2022-02-17 HISTORY — PX: BIOPSY: SHX5522

## 2022-02-17 HISTORY — PX: ESOPHAGOGASTRODUODENOSCOPY (EGD) WITH PROPOFOL: SHX5813

## 2022-02-17 HISTORY — PX: EUS: SHX5427

## 2022-02-17 SURGERY — ESOPHAGOGASTRODUODENOSCOPY (EGD) WITH PROPOFOL
Anesthesia: Monitor Anesthesia Care

## 2022-02-17 MED ORDER — PROPOFOL 10 MG/ML IV BOLUS
INTRAVENOUS | Status: DC | PRN
  Administered 2022-02-17: 200 mg via INTRAVENOUS

## 2022-02-17 MED ORDER — ONDANSETRON HCL 4 MG/2ML IJ SOLN
INTRAMUSCULAR | Status: DC | PRN
  Administered 2022-02-17: 4 mg via INTRAVENOUS

## 2022-02-17 MED ORDER — CIPROFLOXACIN IN D5W 400 MG/200ML IV SOLN
INTRAVENOUS | Status: AC
  Filled 2022-02-17: qty 200

## 2022-02-17 MED ORDER — FENTANYL CITRATE (PF) 100 MCG/2ML IJ SOLN
INTRAMUSCULAR | Status: DC | PRN
  Administered 2022-02-17: 100 ug via INTRAVENOUS

## 2022-02-17 MED ORDER — LIDOCAINE 2% (20 MG/ML) 5 ML SYRINGE
INTRAMUSCULAR | Status: DC | PRN
  Administered 2022-02-17: 100 mg via INTRAVENOUS

## 2022-02-17 MED ORDER — SODIUM CHLORIDE 0.9 % IV SOLN: INTRAVENOUS | Status: DC

## 2022-02-17 MED ORDER — PROPOFOL 10 MG/ML IV BOLUS
INTRAVENOUS | Status: AC
  Filled 2022-02-17: qty 20

## 2022-02-17 MED ORDER — ROCURONIUM BROMIDE 10 MG/ML (PF) SYRINGE
PREFILLED_SYRINGE | INTRAVENOUS | Status: DC | PRN
  Administered 2022-02-17: 60 mg via INTRAVENOUS

## 2022-02-17 MED ORDER — LACTATED RINGERS IV SOLN: INTRAVENOUS | Status: DC

## 2022-02-17 MED ORDER — PHENYLEPHRINE 80 MCG/ML (10ML) SYRINGE FOR IV PUSH (FOR BLOOD PRESSURE SUPPORT)
PREFILLED_SYRINGE | INTRAVENOUS | Status: DC | PRN
  Administered 2022-02-17: 80 ug via INTRAVENOUS
  Administered 2022-02-17: 160 ug via INTRAVENOUS
  Administered 2022-02-17: 80 ug via INTRAVENOUS

## 2022-02-17 MED ORDER — PROPOFOL 1000 MG/100ML IV EMUL
INTRAVENOUS | Status: AC
  Filled 2022-02-17: qty 100

## 2022-02-17 MED ORDER — SUGAMMADEX SODIUM 200 MG/2ML IV SOLN
INTRAVENOUS | Status: DC | PRN
  Administered 2022-02-17: 221.4 mg via INTRAVENOUS

## 2022-02-17 MED ORDER — DEXAMETHASONE SODIUM PHOSPHATE 10 MG/ML IJ SOLN
INTRAMUSCULAR | Status: DC | PRN
  Administered 2022-02-17: 8 mg via INTRAVENOUS

## 2022-02-17 MED ORDER — MIDAZOLAM HCL 2 MG/2ML IJ SOLN
INTRAMUSCULAR | Status: AC
  Filled 2022-02-17: qty 2

## 2022-02-17 MED ORDER — FENTANYL CITRATE (PF) 100 MCG/2ML IJ SOLN
INTRAMUSCULAR | Status: AC
  Filled 2022-02-17: qty 2

## 2022-02-17 MED ORDER — MIDAZOLAM HCL 5 MG/5ML IJ SOLN
INTRAMUSCULAR | Status: DC | PRN
  Administered 2022-02-17: 2 mg via INTRAVENOUS

## 2022-02-17 NOTE — Anesthesia Postprocedure Evaluation (Signed)
Anesthesia Post Note  Patient: Kebin Maye  Procedure(s) Performed: UPPER ENDOSCOPIC ULTRASOUND (EUS) RADIAL ESOPHAGOGASTRODUODENOSCOPY (EGD) WITH PROPOFOL BIOPSY     Patient location during evaluation: PACU Anesthesia Type: MAC Level of consciousness: awake and alert Pain management: pain level controlled Vital Signs Assessment: post-procedure vital signs reviewed and stable Respiratory status: spontaneous breathing, nonlabored ventilation, respiratory function stable and patient connected to nasal cannula oxygen Cardiovascular status: stable and blood pressure returned to baseline Postop Assessment: no apparent nausea or vomiting Anesthetic complications: no  No notable events documented.  Last Vitals:  Vitals:   02/17/22 0940 02/17/22 0950  BP: 125/65 114/73  Pulse: (!) 103 93  Resp: 19 14  Temp:    SpO2: 91% 91%    Last Pain:  Vitals:   02/17/22 0950  TempSrc:   PainSc: 0-No pain                 Andrick Rust

## 2022-02-17 NOTE — Interval H&P Note (Signed)
History and Physical Interval Note:  02/17/2022 7:40 AM  Eric Hodge  has presented today for surgery, with the diagnosis of pancreatic psuedocyst.  The various methods of treatment have been discussed with the patient and family. After consideration of risks, benefits and other options for treatment, the patient has consented to  Procedure(s): UPPER ENDOSCOPIC ULTRASOUND (EUS) RADIAL (N/A) ESOPHAGOGASTRODUODENOSCOPY (EGD) WITH PROPOFOL (N/A) as a surgical intervention.  The patient's history has been reviewed, patient examined, no change in status, stable for surgery.  I have reviewed the patient's chart and labs.  Questions were answered to the patient's satisfaction.    The risks of an EUS, including intestinal perforation, bleeding, infection, aspiration, and medication effects were discussed.  When a cystgastrostomy/cystenterostomy is performed as part of the EUS, there is an additional risk of pancreatitis at the rate of about 1-2%.  It was explained that procedure related pancreatitis is typically mild, although at times it can be severe and even life threatening.    [employer]

## 2022-02-17 NOTE — Transfer of Care (Signed)
Immediate Anesthesia Transfer of Care Note  Patient: Eric Hodge  Procedure(s) Performed: UPPER ENDOSCOPIC ULTRASOUND (EUS) RADIAL ESOPHAGOGASTRODUODENOSCOPY (EGD) WITH PROPOFOL BIOPSY  Patient Location: Endoscopy Unit  Anesthesia Type:General  Level of Consciousness: drowsy and patient cooperative  Airway & Oxygen Therapy: Patient Spontanous Breathing and Patient connected to face mask oxygen  Post-op Assessment: Report given to RN and Post -op Vital signs reviewed and stable  Post vital signs: Reviewed and stable  Last Vitals:  Vitals Value Taken Time  BP 137/79 02/17/22 0930  Temp    Pulse 104 02/17/22 0933  Resp 19 02/17/22 0933  SpO2 97 % 02/17/22 0933  Vitals shown include unvalidated device data.  Last Pain:  Vitals:   02/17/22 0700  TempSrc: Temporal  PainSc:          Complications: No notable events documented.

## 2022-02-17 NOTE — Op Note (Signed)
[facility] Patient Name: Eric Hodge Procedure Date: 02/17/2022 MRN: [id number] Attending MD: Justice Britain , MD, [id number] Date of Birth: [birth year] CSN: [id number] Age: 37 Admit Type: Outpatient Procedure:                Upper EUS Indications:              Pancreatic cyst on CT scan, Acute pancreatitis,                            Upper abdominal pain Providers:                Justice Britain, MD, Carlyn Reichert, RN, [facility] Technician, Technician Referring MD:             Willia Craze, Cammie Mcgee. Naoma Diener, MD Medicines:                General Anesthesia, Cipro 275 mg IV Complications:            No immediate complications. Estimated Blood Loss:     Estimated blood loss was minimal. Procedure:                Pre-Anesthesia Assessment:                           - Prior to the procedure, a History and Physical                            was performed, and patient medications and                            allergies were reviewed. The patient's tolerance of                            previous anesthesia was also reviewed. The risks                            and benefits of the procedure and the sedation                            options and risks were discussed with the patient.                            All questions were answered, and informed consent                            was obtained. Prior Anticoagulants: The patient has                            taken no anticoagulant or antiplatelet agents  except for NSAID medication. ASA Grade Assessment:                            II - A patient with mild systemic disease. After                            reviewing the risks and benefits, the patient was                            deemed in satisfactory condition to undergo the                            procedure.                           After  obtaining informed consent, the endoscope was                            passed under direct vision. Throughout the                            procedure, the patient's blood pressure, pulse, and                            oxygen saturations were monitored continuously. The                            [id number]-H190 ([id number]) Olympus endoscope was introduced                            through the mouth, and advanced to the second part                            of duodenum. The [id number]-Q190V ([id number]) Olympus                            duodenoscope was introduced through the mouth, and                            advanced to the area of papilla. The [id number]                            ([id number]) Olympus linear ultrasound scope was                            introduced through the mouth, and advanced to the                            duodenum for ultrasound examination from the                            stomach and duodenum. The upper EUS was  accomplished without difficulty. The patient                            tolerated the procedure. Scope In: Scope Out: Findings:      ENDOSCOPIC FINDING: :      No gross lesions were noted in the entire esophagus.      The Z-line was irregular and was found 41 cm from the incisors.      Mild portal hypertensive gastropathy was found in the entire examined       stomach.      Patchy mildly erythematous mucosa without bleeding was found in the       entire examined stomach. Biopsies were taken with a cold forceps for       histology and Helicobacter pylori testing.      Extrinsic impression/compression on the stomach was found on the       posterior wall of the stomach.      Patchy mild inflammation characterized by erythema was found in the       duodenal bulb, in the first portion of the duodenum and in the second       portion of the duodenum.      The major papilla was normal.      ENDOSONOGRAPHIC FINDING: :      An anechoic  lesion suggestive of a cyst was identified in the pancreatic       head/genu/body. The lesion measured 110 mm by 90 mm in maximal       cross-sectional diameter. There was a single compartment without septae.       The outer wall of the lesion was thin. There was no associated mass.       There was a significant amount of internal debris within the       fluid-filled cavity consistent with necrosis.      Endosonographic imaging in the visualized portion of the liver showed no       mass.      No malignant-appearing lymph nodes were visualized in the peripancreatic       region.      The celiac region was visualized. Impression:               EGD impression:                           - No gross lesions in the entire esophagus. Z-line                            irregular, 41 cm from the incisors.                           - Portal hypertensive gastropathy.                           - Erythematous mucosa in the stomach. Biopsied.                           - Extrinsic compression in the posterior wall of                            the stomach.                           -  Duodenitis.                           - Normal major papilla.                           EUS impression:                           - A cystic lesion was seen in the pancreatic                            head/genu/body. Tissue has not been obtained.                            However, the endosonographic appearance is                            consistent with a pancreatic pseudocyst with                            evidence of significant amount of walled off                            necrosis. Cystgastrostomy was not performed today                            as I will not be around for the next few weeks                            where in which the patient would have an increased                            risk for potential need for necrosectomy.                           - Parenchymal evaluation of the pancreas will                             better be evaluated after cyst has been drained.                           - No malignant-appearing lymph nodes were                            visualized in the peripancreatic region. Moderate Sedation:      Not Applicable - Patient had care per Anesthesia. Recommendation:           - The patient will be observed post-procedure,                            until all discharge criteria are met.                           - Discharge patient to home.                           -  Patient has a contact number available for                            emergencies. The signs and symptoms of potential                            delayed complications were discussed with the                            patient. Return to normal activities tomorrow.                            Written discharge instructions were provided to the                            patient.                           - Low fat diet.                           - Observe patient's clinical course.                           - Plan to reschedule EGD/EUS with cystgastrostomy                            creation. I will need to be available should                            patient end up needing necrosectomy thereafter                            based on the amount of necrosis that seems to be                            within the cystic cavity, which we would have a                            better assessment after cystgastrostomy was                            created. My team will work on rescheduling.                           - Await path results.                           - Continue complete alcohol cessation is necessary.                           - The findings and recommendations were discussed                            with the patient.                           -  The findings and recommendations were discussed                            with the designated responsible adult. Procedure Code(s):         --- Professional ---                           [id number], Esophagogastroduodenoscopy, flexible,                            transoral; with endoscopic ultrasound examination                            limited to the esophagus, stomach or duodenum, and                            adjacent structures                           43239, Esophagogastroduodenoscopy, flexible,                            transoral; with biopsy, single or multiple Diagnosis Code(s):        --- Professional ---                           K22.89, Other specified disease of esophagus                           K76.6, Portal hypertension                           K31.89, Other diseases of stomach and duodenum                           K29.80, Duodenitis without bleeding                           K86.2, Cyst of pancreas                           I89.9, Noninfective disorder of lymphatic vessels                            and lymph nodes, unspecified                           K85.90, Acute pancreatitis without necrosis or                            infection, unspecified                           R10.10, Upper abdominal pain, unspecified CPT copyright 2022 American Medical Association. All rights reserved. The codes documented in this report are preliminary and upon coder review may  be revised to meet current compliance requirements. Justice Britain, MD 02/17/2022 9:40:44 AM Number of Addenda: 0

## 2022-02-17 NOTE — Discharge Instructions (Signed)
YOU HAD AN ENDOSCOPIC PROCEDURE TODAY: Refer to the procedure report and other information in the discharge instructions given to you for any specific questions about what was found during the examination. If this information does not answer your questions, please call [facility] office at [phone] to clarify.  ° °YOU SHOULD EXPECT: Some feelings of bloating in the abdomen. Passage of more gas than usual. Walking can help get rid of the air that was put into your GI tract during the procedure and reduce the bloating. If you had a lower endoscopy (such as a colonoscopy or flexible sigmoidoscopy) you may notice spotting of blood in your stool or on the toilet paper. Some abdominal soreness may be present for a day or two, also. ° °DIET: Your first meal following the procedure should be a light meal and then it is ok to progress to your normal diet. A half-sandwich or bowl of soup is an example of a good first meal. Heavy or fried foods are harder to digest and may make you feel nauseous or bloated. Drink plenty of fluids but you should avoid alcoholic beverages for 24 hours. If you had a esophageal dilation, please see attached instructions for diet.   ° °ACTIVITY: Your care partner should take you home directly after the procedure. You should plan to take it easy, moving slowly for the rest of the day. You can resume normal activity the day after the procedure however YOU SHOULD NOT DRIVE, use power tools, machinery or perform tasks that involve climbing or major physical exertion for 24 hours (because of the sedation medicines used during the test).  ° °SYMPTOMS TO REPORT IMMEDIATELY: °A gastroenterologist can be reached at any hour. Please call [phone]  for any of the following symptoms:  °Following lower endoscopy (colonoscopy, flexible sigmoidoscopy) °Excessive amounts of blood in the stool  °Significant tenderness, worsening of abdominal pains  °Swelling of the abdomen that is new, acute  °Fever of 100° or  higher  °Following upper endoscopy (EGD, EUS, ERCP, esophageal dilation) °Vomiting of blood or coffee ground material  °New, significant abdominal pain  °New, significant chest pain or pain under the shoulder blades  °Painful or persistently difficult swallowing  °New shortness of breath  °Black, tarry-looking or red, bloody stools ° °FOLLOW UP:  °If any biopsies were taken you will be contacted by phone or by letter within the next 1-3 weeks. Call [phone]  if you have not heard about the biopsies in 3 weeks.  °Please also call with any specific questions about appointments or follow up tests. ° °

## 2022-02-17 NOTE — Anesthesia Procedure Notes (Signed)
Procedure Name: Intubation Date/Time: 02/17/2022 8:51 AM  Performed by: Montel Clock, CRNAPre-anesthesia Checklist: Patient identified, Emergency Drugs available, Suction available, Patient being monitored and Timeout performed Patient Re-evaluated:Patient Re-evaluated prior to induction Oxygen Delivery Method: Circle system utilized Preoxygenation: Pre-oxygenation with 100% oxygen Induction Type: IV induction Ventilation: Mask ventilation without difficulty and Oral airway inserted - appropriate to patient size Laryngoscope Size: Mac and 4 Grade View: Grade I Tube type: Oral Tube size: 7.5 mm Number of attempts: 1 Airway Equipment and Method: Stylet Placement Confirmation: ETT inserted through vocal cords under direct vision, positive ETCO2 and breath sounds checked- equal and bilateral Secured at: 23 cm Tube secured with: Tape Dental Injury: Teeth and Oropharynx as per pre-operative assessment

## 2022-02-18 ENCOUNTER — Telehealth: Payer: Self-pay

## 2022-02-18 ENCOUNTER — Encounter: Payer: Self-pay | Admitting: Gastroenterology

## 2022-02-18 LAB — SURGICAL PATHOLOGY

## 2022-02-18 NOTE — Telephone Encounter (Signed)
The pt has been rescheduled to 03/17/22 at 4 pm at [facility] with GM   EUS scheduled, pt instructed and medications reviewed.  Patient instructions mailed to home.  Patient to call with any questions or concerns.

## 2022-02-18 NOTE — Telephone Encounter (Signed)
-----   Message from Gabriel Mansouraty Jr., MD sent at 02/17/2022  5:10 PM EST ----- Regarding: EUS [name], Please schedule this patient for EUS with me on 19 February or 29 February. I could not perform his cystgastrostomy today because he has evidence of necrosis and if I performed his cystgastrostomy today and he developed infected necrosis while I was away we would be in bigger trouble with him overall. Thus we held off on that but I do think he is a good candidate for EUS cystgastrostomy. Please offer him 1 of those dates that works. Thanks. GM  

## 2022-02-18 NOTE — Telephone Encounter (Signed)
-----   Message from Irving Copas., MD sent at 02/17/2022  5:10 PM EST ----- Regarding: EUS Jenni Thew, Please schedule this patient for EUS with me on 19 February or 29 February. I could not perform his cystgastrostomy today because he has evidence of necrosis and if I performed his cystgastrostomy today and he developed infected necrosis while I was away we would be in bigger trouble with him overall. Thus we held off on that but I do think he is a good candidate for EUS cystgastrostomy. Please offer him 1 of those dates that works. Thanks. GM

## 2022-02-21 ENCOUNTER — Other Ambulatory Visit (HOSPITAL_COMMUNITY): Payer: 59

## 2022-02-27 NOTE — Anesthesia Postprocedure Evaluation (Signed)
Anesthesia Post Note  Patient: Eric Hodge  Procedure(s) Performed: UPPER ENDOSCOPIC ULTRASOUND (EUS) RADIAL ESOPHAGOGASTRODUODENOSCOPY (EGD) WITH PROPOFOL BIOPSY     Patient location during evaluation: PACU Anesthesia Type: General Level of consciousness: awake and alert Pain management: pain level controlled Vital Signs Assessment: post-procedure vital signs reviewed and stable Respiratory status: spontaneous breathing, nonlabored ventilation, respiratory function stable and patient connected to nasal cannula oxygen Cardiovascular status: blood pressure returned to baseline and stable Postop Assessment: no apparent nausea or vomiting Anesthetic complications: no  No notable events documented.  Last Vitals:  Vitals:   02/17/22 0940 02/17/22 0950  BP: 125/65 114/73  Pulse: (!) 103 93  Resp: 19 14  Temp:    SpO2: 91% 91%    Last Pain:  Vitals:   02/21/22 1443  TempSrc:   PainSc: 0-No pain                 [name]

## 2022-02-27 NOTE — Addendum Note (Signed)
Addendum  created 02/27/22 2149 by Janeece Riggers, MD   Clinical Note Signed

## 2022-02-27 NOTE — Anesthesia Preprocedure Evaluation (Addendum)
Anesthesia Evaluation  Patient identified by MRN, date of birth, ID band Patient awake    Reviewed: Allergy & Precautions, NPO status , Patient's Chart, lab work & pertinent test results  History of Anesthesia Complications Negative for: history of anesthetic complications  Airway Mallampati: III  TM Distance: >3 FB Neck ROM: Full   Comment: Previous grade I view with MAC 4, mask with O[employer] Dental  (+) Dental Advisory Given,    Pulmonary neg shortness of breath, neg sleep apnea, neg COPD, neg recent URI, Current Smoker and Patient abstained from smoking.   Pulmonary exam normal breath sounds clear to auscultation       Cardiovascular (-) hypertension(-) angina (-) Past MI, (-) Cardiac Stents and (-) CABG (-) dysrhythmias  Rhythm:Regular Rate:Normal  HLD   Neuro/Psych  PSYCHIATRIC DISORDERS (ADHD)      negative neurological ROS     GI/Hepatic ,GERD  Medicated,,(+)     substance abuse (reports he drinks almost nothing since his last procedure)  alcohol usePancreatic pseudocyst, h/o alcoholic pancreatitis   Endo/Other  negative endocrine ROS    Renal/GU negative Renal ROS     Musculoskeletal   Abdominal  (+) + obese  Peds  Hematology negative hematology ROS (+)   Anesthesia Other Findings   Reproductive/Obstetrics                             Anesthesia Physical Anesthesia Plan  ASA: 2  Anesthesia Plan: General   Post-op Pain Management:    Induction: Intravenous  PONV Risk Score and Plan: 0 and Propofol infusion and Treatment may vary due to age or medical condition  Airway Management Planned: Oral ETT  Additional Equipment:   Intra-op Plan:   Post-operative Plan: Extubation in OR  Informed Consent: I have reviewed the patients History and Physical, chart, labs and discussed the procedure including the risks, benefits and alternatives for the proposed anesthesia with the patient  or authorized representative who has indicated his/her understanding and acceptance.     Dental advisory given  Plan Discussed with: CRNA and Anesthesiologist  Anesthesia Plan Comments: (Risks of general anesthesia discussed including, but not limited to, sore throat, hoarse voice, chipped/damaged teeth, injury to vocal cords, nausea and vomiting, allergic reactions, lung infection, heart attack, stroke, and death. All questions answered.  )       Anesthesia Quick Evaluation

## 2022-03-09 ENCOUNTER — Other Ambulatory Visit: Payer: Self-pay | Admitting: Family Medicine

## 2022-03-10 ENCOUNTER — Encounter (HOSPITAL_COMMUNITY): Payer: Self-pay | Admitting: Gastroenterology

## 2022-03-10 MED ORDER — TESTOSTERONE CYPIONATE 200 MG/ML IM SOLN: 200.0000 mg | INTRAMUSCULAR | 0 refills | Status: DC

## 2022-03-17 ENCOUNTER — Ambulatory Visit (HOSPITAL_BASED_OUTPATIENT_CLINIC_OR_DEPARTMENT_OTHER): Payer: 59 | Admitting: Anesthesiology

## 2022-03-17 ENCOUNTER — Ambulatory Visit (HOSPITAL_COMMUNITY): Payer: 59 | Admitting: Anesthesiology

## 2022-03-17 ENCOUNTER — Telehealth: Payer: Self-pay

## 2022-03-17 ENCOUNTER — Ambulatory Visit (HOSPITAL_COMMUNITY)
Admission: RE | Admit: 2022-03-17 | Discharge: 2022-03-17 | Disposition: A | Discharge status: Home / Self Care | Payer: 59 | Attending: Gastroenterology | Admitting: Gastroenterology

## 2022-03-17 ENCOUNTER — Other Ambulatory Visit: Payer: Self-pay

## 2022-03-17 ENCOUNTER — Encounter (HOSPITAL_COMMUNITY): Payer: Self-pay | Admitting: Gastroenterology

## 2022-03-17 ENCOUNTER — Encounter (HOSPITAL_COMMUNITY)
Admission: RE | Disposition: A | Discharge status: Home / Self Care | Payer: Self-pay | Source: Home / Self Care | Attending: Gastroenterology

## 2022-03-17 DIAGNOSIS — F909 Attention-deficit hyperactivity disorder, unspecified type: Secondary | ICD-10-CM | POA: Insufficient documentation

## 2022-03-17 DIAGNOSIS — Z6834 Body mass index (BMI) 34.0-34.9, adult: Secondary | ICD-10-CM | POA: Diagnosis not present

## 2022-03-17 DIAGNOSIS — K3189 Other diseases of stomach and duodenum: Secondary | ICD-10-CM

## 2022-03-17 DIAGNOSIS — E669 Obesity, unspecified: Secondary | ICD-10-CM | POA: Insufficient documentation

## 2022-03-17 DIAGNOSIS — K862 Cyst of pancreas: Secondary | ICD-10-CM | POA: Diagnosis not present

## 2022-03-17 DIAGNOSIS — K2289 Other specified disease of esophagus: Secondary | ICD-10-CM | POA: Insufficient documentation

## 2022-03-17 DIAGNOSIS — F17210 Nicotine dependence, cigarettes, uncomplicated: Secondary | ICD-10-CM

## 2022-03-17 DIAGNOSIS — K863 Pseudocyst of pancreas: Secondary | ICD-10-CM

## 2022-03-17 DIAGNOSIS — K766 Portal hypertension: Secondary | ICD-10-CM | POA: Insufficient documentation

## 2022-03-17 DIAGNOSIS — E785 Hyperlipidemia, unspecified: Secondary | ICD-10-CM | POA: Insufficient documentation

## 2022-03-17 DIAGNOSIS — R933 Abnormal findings on diagnostic imaging of other parts of digestive tract: Secondary | ICD-10-CM | POA: Insufficient documentation

## 2022-03-17 DIAGNOSIS — R932 Abnormal findings on diagnostic imaging of liver and biliary tract: Secondary | ICD-10-CM | POA: Diagnosis not present

## 2022-03-17 DIAGNOSIS — R109 Unspecified abdominal pain: Secondary | ICD-10-CM

## 2022-03-17 DIAGNOSIS — K219 Gastro-esophageal reflux disease without esophagitis: Secondary | ICD-10-CM | POA: Insufficient documentation

## 2022-03-17 HISTORY — PX: PANCREATIC STENT PLACEMENT: SHX5539

## 2022-03-17 HISTORY — PX: BALLOON DILATION: SHX5330

## 2022-03-17 HISTORY — PX: ESOPHAGOGASTRODUODENOSCOPY (EGD) WITH PROPOFOL: SHX5813

## 2022-03-17 HISTORY — PX: EUS: SHX5427

## 2022-03-17 HISTORY — PX: CYST GASTROSTOMY: SHX6862

## 2022-03-17 SURGERY — UPPER ENDOSCOPIC ULTRASOUND (EUS) RADIAL
Anesthesia: Monitor Anesthesia Care

## 2022-03-17 MED ORDER — PROPOFOL 10 MG/ML IV BOLUS
INTRAVENOUS | Status: DC | PRN
  Administered 2022-03-17: 200 mg via INTRAVENOUS

## 2022-03-17 MED ORDER — LACTATED RINGERS IV SOLN: INTRAVENOUS | Status: DC

## 2022-03-17 MED ORDER — SUCCINYLCHOLINE CHLORIDE 200 MG/10ML IV SOSY
PREFILLED_SYRINGE | INTRAVENOUS | Status: DC | PRN
  Administered 2022-03-17: 140 mg via INTRAVENOUS

## 2022-03-17 MED ORDER — CIPROFLOXACIN HCL 500 MG PO TABS: 500.0000 mg | ORAL_TABLET | Freq: Two times a day (BID) | ORAL | 0 refills | Status: AC

## 2022-03-17 MED ORDER — FENTANYL CITRATE (PF) 100 MCG/2ML IJ SOLN
INTRAMUSCULAR | Status: AC
  Filled 2022-03-17: qty 2

## 2022-03-17 MED ORDER — MIDAZOLAM HCL 2 MG/2ML IJ SOLN
INTRAMUSCULAR | Status: AC
  Filled 2022-03-17: qty 2

## 2022-03-17 MED ORDER — MIDAZOLAM HCL 2 MG/2ML IJ SOLN
INTRAMUSCULAR | Status: DC | PRN
  Administered 2022-03-17 (×2): 1 mg via INTRAVENOUS

## 2022-03-17 MED ORDER — PROPOFOL 10 MG/ML IV BOLUS
INTRAVENOUS | Status: AC
  Filled 2022-03-17: qty 20

## 2022-03-17 MED ORDER — OMEPRAZOLE 20 MG PO CPDR: 20.0000 mg | DELAYED_RELEASE_CAPSULE | Freq: Every day | ORAL | 11 refills | Status: DC

## 2022-03-17 MED ORDER — CIPROFLOXACIN IN D5W 400 MG/200ML IV SOLN
INTRAVENOUS | Status: AC
  Filled 2022-03-17: qty 200

## 2022-03-17 MED ORDER — FENTANYL CITRATE (PF) 250 MCG/5ML IJ SOLN: INTRAMUSCULAR | Status: DC | PRN

## 2022-03-17 MED ORDER — FENTANYL CITRATE (PF) 100 MCG/2ML IJ SOLN
INTRAMUSCULAR | Status: DC | PRN
  Administered 2022-03-17 (×4): 50 ug via INTRAVENOUS

## 2022-03-17 MED ORDER — ONDANSETRON HCL 4 MG/2ML IJ SOLN
INTRAMUSCULAR | Status: DC | PRN
  Administered 2022-03-17: 4 mg via INTRAVENOUS

## 2022-03-17 MED ORDER — CIPROFLOXACIN IN D5W 400 MG/200ML IV SOLN
INTRAVENOUS | Status: DC | PRN
  Administered 2022-03-17: 400 mg via INTRAVENOUS

## 2022-03-17 MED ORDER — LIDOCAINE 2% (20 MG/ML) 5 ML SYRINGE
INTRAMUSCULAR | Status: DC | PRN
  Administered 2022-03-17: 100 mg via INTRAVENOUS

## 2022-03-17 MED ORDER — DEXAMETHASONE SODIUM PHOSPHATE 10 MG/ML IJ SOLN
INTRAMUSCULAR | Status: DC | PRN
  Administered 2022-03-17: 10 mg via INTRAVENOUS

## 2022-03-17 NOTE — Anesthesia Procedure Notes (Signed)
Procedure Name: Intubation Date/Time: 03/17/2022 11:28 AM  Performed by: Cynda Familia, CRNAPre-anesthesia Checklist: Patient identified, Emergency Drugs available, Suction available and Patient being monitored Patient Re-evaluated:Patient Re-evaluated prior to induction Oxygen Delivery Method: Circle System Utilized Preoxygenation: Pre-oxygenation with 100% oxygen Induction Type: IV induction, Cricoid Pressure applied and Rapid sequence Ventilation: Mask ventilation without difficulty Laryngoscope Size: Miller and 2 Grade View: Grade I Tube type: Oral Number of attempts: 1 Airway Equipment and Method: Stylet Placement Confirmation: ETT inserted through vocal cords under direct vision, positive ETCO2 and breath sounds checked- equal and bilateral Secured at: 21 cm Tube secured with: Tape Dental Injury: Teeth and Oropharynx as per pre-operative assessment  Comments: Eric Hodge-- smooth IV induction-- intubation AM CRNA - atraumatic-- chipping on front teeth that was present preop-- unchanged with laryngoscopy-- bilat BS Eric Hodge

## 2022-03-17 NOTE — Anesthesia Procedure Notes (Signed)
Date/Time: 03/17/2022 12:30 PM  Performed by: Cynda Familia, CRNAOxygen Delivery Method: Simple face mask Placement Confirmation: positive ETCO2 and breath sounds checked- equal and bilateral Dental Injury: Teeth and Oropharynx as per pre-operative assessment

## 2022-03-17 NOTE — Op Note (Signed)
[facility] Patient Name: Eric Hodge Procedure Date: 03/17/2022 MRN: [id number] Attending MD: Justice Britain , MD, [id number] Date of Birth: 05/14/1985 CSN: [id number] Age: 37 Admit Type: Outpatient Procedure:                Upper EUS Indications:              Pancreatic cyst on CT scan, Abnormal ultrasound of                            the abdomen Providers:                Justice Britain, MD, Burtis Junes, RN, Cletis Athens,                            Technician Referring MD:             Cammie Mcgee. Launa Grill Medicines:                General Anesthesia, Cipro [id number] mg IV Complications:            No immediate complications. Estimated Blood Loss:     Estimated blood loss was minimal. Procedure:                Pre-Anesthesia Assessment:                           - Prior to the procedure, a History and Physical                            was performed, and patient medications and                            allergies were reviewed. The patient's tolerance of                            previous anesthesia was also reviewed. The risks                            and benefits of the procedure and the sedation                            options and risks were discussed with the patient.                            All questions were answered, and informed consent                            was obtained. Prior Anticoagulants: The patient has                            taken no anticoagulant or antiplatelet agents. ASA                            Grade Assessment: II - A patient with mild systemic  disease. After reviewing the risks and benefits,                            the patient was deemed in satisfactory condition to                            undergo the procedure.                           After obtaining informed consent, the endoscope was                            passed under direct vision. Throughout the                             procedure, the patient's blood pressure, pulse, and                            oxygen saturations were monitored continuously. The                            [id number] Olympus therapeutic endoscope                            was introduced through the mouth, and advanced to                            the second part of duodenum. The [id number]                            [id number]) Olympus linear ultrasound scope was                            introduced through the mouth, and advanced to the                            duodenum for ultrasound examination from the                            stomach and duodenum. The upper EUS was                            accomplished without difficulty. The patient                            tolerated the procedure. Scope In: Scope Out: Findings:      ENDOSCOPIC FINDING: :      No gross lesions were noted in the entire esophagus.      The Z-line was irregular and was found 40 cm from the incisors.      Mild portal hypertensive gastropathy was found in the entire examined       stomach.      Extrinsic compression on the stomach was found in the gastric body.      No gross lesions were noted in the duodenal bulb,  in the first portion       of the duodenum and in the second portion of the duodenum.      ENDOSONOGRAPHIC FINDING: :      An anechoic lesion suggestive of a cyst was identified in the       genu/body/tail of the pancreas. The lesion measured 110 mm by 88 mm in       maximal cross-sectional diameter. There was a single compartment thinly       septated. The outer wall of the lesion was thin. There was no associated       mass. There was internal debris within the fluid-filled cavity. The       decision was made to create a cystogastrostomy using the AXIOS stent       system. Once an appropriate position in the stomach was identified, the       common wall between the stomach and the cyst was interrogated utilizing        color Doppler imaging to identify interposed vessels. The AXIOS stent       and electrocautery device were introduced through the working channel.       The AXIOS catheter was advanced to the common wall between the stomach       and the cyst. Current was applied to the cautery tip and then the       catheter was advanced into the cyst. A 20 x 10 mm AXIOS stent was placed       with the flanges in close approximation to the walls of the cyst and the       stomach through the cystogastrostomy. The stent was successfully placed.       A 7 cm 10 Fr double pigtail stent was placed into the pseudocyst through       the cystogastrostomy using a stent introducer set. The stent was       successfully placed. Suctioned via endoscope was performed and 500 cc of       fluid were removed from the cyst cavity. Fluid was aspirated for culture       data as well. Impression:               EGD impression:                           - No gross lesions in the entire esophagus. Z-line                            irregular, 40 cm from the incisors.                           - Portal hypertensive gastropathy.                           - Extrinsic compression in the gastric body.                           - No gross lesions in the duodenal bulb, in the                            first portion of the duodenum and in the second  portion of the duodenum.                           EUS impression:                           - A cystic lesion was seen in the genu/body/tail of                            the pancreas. Tissue has not been obtained.                            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                            Cystogastrostomy was performed with AXIOS 20 mm x                            10 mm stent and a subsequent 10 French                            double-pigtail stent being placed through the  AXIOS                            as well. Fluid was aspirated and sent for culture                            data. 500 cc of fluid were removed.                           - Moderate Sedation:      Not Applicable - Patient had care per Anesthesia. Recommendation:           - The patient will be observed post-procedure,                            until all discharge criteria are met.                           - Discharge patient to home.                           - Patient has a contact number available for                            emergencies. The signs and symptoms of potential                            delayed complications were discussed with the                            patient. Return to normal activities tomorrow.  Written discharge instructions were provided to the                            patient.                           - Full liquid diet today.                           - Observe patient's clinical course.                           - Ciprofloxacin 500 mg twice daily to continue for                            next 2 to 4 weeks (pending imaging).                           - We will plan to repeat imaging in 2 weeks to see                            if patient has significant necrosis that is still                            present that could then require necrosectomy though                            based on the endoscopic review today I think that                            would be less likely.                           - Repeat EGD in 4 to 8 weeks per protocol for stent                            removal.                           - The findings and recommendations were discussed                            with the patient.                           - The findings and recommendations were discussed                            with the designated responsible adult. Procedure Code(s):        --- Professional ---                            [phone], Esophagogastroduodenoscopy, flexible,  transoral; with transmural drainage of pseudocyst                            (includes placement of transmural drainage                            catheter[s]/stent[s], when performed, and                            endoscopic ultrasound, when performed) Diagnosis Code(s):        --- Professional ---                           K22.89, Other specified disease of esophagus                           K76.6, Portal hypertension                           K31.89, Other diseases of stomach and duodenum                           K86.2, Cyst of pancreas                           R93.5, Abnormal findings on diagnostic imaging of                            other abdominal regions, including retroperitoneum CPT copyright 2022 [employer] Association. All rights reserved. The codes documented in this report are preliminary and upon coder review may  be revised to meet current compliance requirements. Justice Britain, MD 03/17/2022 12:40:34 PM Number of Addenda: 0

## 2022-03-17 NOTE — H&P (Signed)
GASTROENTEROLOGY PROCEDURE H&P NOTE   Primary Care Physician: Susy Frizzle, MD  HPI: Eric Hodge is a 37 y.o. male who presents for EGD/EUS for attempt at endoscopic cystgastrostomy creation for Pseudocyst/WON.  Past Medical History:  Diagnosis Date   ADHD (attention deficit hyperactivity disorder)    Alcohol use disorder 09/2021   Elevated LFTs    Hyperlipidemia    Pancreatitis, alcoholic, acute [id number]   Retained orthopedic hardware 02/2017   right distal radius   Past Surgical History:  Procedure Laterality Date   BIOPSY  02/17/2022   Procedure: BIOPSY;  Surgeon: Irving Copas., MD;  Location: [facility];  Service: Gastroenterology;;   Wilmon Pali RELEASE Right 10/06/2016   Procedure: CARPAL TUNNEL RELEASE;  Surgeon: Leanora Cover, MD;  Location: [place];  Service: Orthopedics;  Laterality: Right;   ESOPHAGOGASTRODUODENOSCOPY (EGD) WITH PROPOFOL N/A 02/17/2022   Procedure: ESOPHAGOGASTRODUODENOSCOPY (EGD) WITH PROPOFOL;  Surgeon: Rush Landmark Telford Nab., MD;  Location: [facility];  Service: Gastroenterology;  Laterality: N/A;   EUS N/A 02/17/2022   Procedure: UPPER ENDOSCOPIC ULTRASOUND (EUS) RADIAL;  Surgeon: Irving Copas., MD;  Location: [facility];  Service: Gastroenterology;  Laterality: N/A;   FOREIGN BODY REMOVAL  09/08/2011   Procedure: REMOVAL FOREIGN BODY EXTREMITY;  Surgeon: Tennis Must, MD;  Location: [place];  Service: Orthopedics;  Laterality: Left;  FOREIGN BODY REMOVAL LEFT LONG FINGER   HARDWARE REMOVAL Right 03/10/2017   Procedure: HARDWARE REMOVAL RIGHT WRIST;  Surgeon: Leanora Cover, MD;  Location: [place];  Service: Orthopedics;  Laterality: Right;   MASS EXCISION Left 10/29/2015   Procedure: LEFT WRIST EXCISION MASS;  Surgeon: Leanora Cover, MD;  Location: [place];  Service: Orthopedics;  Laterality: Left;  LEFT WRIST EXCISION MASS   OPEN REDUCTION INTERNAL  FIXATION (ORIF) DISTAL RADIAL FRACTURE Right 10/06/2016   Procedure: OPEN REDUCTION INTERNAL FIXATION (ORIF) RIGHT DISTAL RADIUS;  Surgeon: Leanora Cover, MD;  Location: [place];  Service: Orthopedics;  Laterality: Right;   WISDOM TOOTH EXTRACTION  2012   Current Facility-Administered Medications  Medication Dose Route Frequency Provider Last Rate Last Admin   lactated ringers infusion   Intravenous Continuous Mansouraty, Telford Nab., MD 50 mL/hr at 03/17/22 0947 New Bag at 03/17/22 0947    Current Facility-Administered Medications:    lactated ringers infusion, , Intravenous, Continuous, Mansouraty, Telford Nab., MD, Last Rate: 50 mL/hr at 03/17/22 0947, New Bag at 03/17/22 0947 No Known Allergies Family History  Problem Relation Age of Onset   Stroke Paternal Grandmother    Hypertension Paternal Grandmother    Cancer Paternal Grandfather    Hyperlipidemia Paternal Grandfather    Coronary artery disease Father 41       At 70 had MI --> stent. This was his 1st dx CAD   Social History   Socioeconomic History   Marital status: Divorced    Spouse name: Not on file   Number of children: Not on file   Years of education: Not on file   Highest education level: Not on file  Occupational History   Not on file  Tobacco Use   Smoking status: Every Day    Packs/day: 0.50    Years: 10.00    Total pack years: 5.00    Types: Cigarettes   Smokeless tobacco: Never  Vaping Use   Vaping Use: Never used  Substance and Sexual Activity   Alcohol use: Yes    Alcohol/week: 6.0 standard drinks of  alcohol    Types: 6 Shots of liquor per week    Comment: 4-5 days per week   Drug use: No   Sexual activity: Not Currently  Other Topics Concern   Not on file  Social History Narrative   Not on file   Social Determinants of Health   Financial Resource Strain: Not on file  Food Insecurity: Not on file  Transportation Needs: Not on file  Physical Activity: Not on file  Stress:  Not on file  Social Connections: Not on file  Intimate Partner Violence: Not on file    Physical Exam: Today's Vitals   03/10/22 1514 03/17/22 0935  BP:  133/78  Pulse:  81  Resp:  15  Temp:  98.1 F (36.7 C)  TempSrc:  Temporal  SpO2:  98%  Weight: 110.7 kg 110.7 kg  Height:  '5\' 11"'$  (1.803 m)  PainSc:  0-No pain   Body mass index is 34.03 kg/m. GEN: NAD EYE: Sclerae anicteric ENT: MMM CV: Non-tachycardic GI: Soft, NT/ND NEURO:  Alert & Oriented x 3  Lab Results: No results for input(s): "WBC", "HGB", "HCT", "PLT" in the last 72 hours. BMET No results for input(s): "NA", "K", "CL", "CO2", "GLUCOSE", "BUN", "CREATININE", "CALCIUM" in the last 72 hours. LFT No results for input(s): "PROT", "ALBUMIN", "AST", "ALT", "ALKPHOS", "BILITOT", "BILIDIR", "IBILI" in the last 72 hours. PT/INR No results for input(s): "LABPROT", "INR" in the last 72 hours.   Impression / Plan: This is a 37 y.o.male who presents for EGD/EUS for attempt at endoscopic cystgastrostomy creation for Pseudocyst/WON.  The risks of an EUS, including intestinal perforation, bleeding, infection, aspiration, and medication effects were discussed.  When a cystgastrostomy/cystenterostomy is performed as part of the EUS, there is an additional risk of pancreatitis at the rate of about 1-2%.  It was explained that procedure related pancreatitis is typically mild, although at times it can be severe and even life threatening.   The risks and benefits of endoscopic evaluation/treatment were discussed with the patient and/or family; these include but are not limited to the risk of perforation, infection, bleeding, missed lesions, lack of diagnosis, severe illness requiring hospitalization, as well as anesthesia and sedation related illnesses.  The patient's history has been reviewed, patient examined, no change in status, and deemed stable for procedure.  The patient and/or family is agreeable to proceed.    Justice Britain, MD [facility] Office # [id number]

## 2022-03-17 NOTE — Transfer of Care (Signed)
Immediate Anesthesia Transfer of Care Note  Patient: Eric Hodge  Procedure(s) Performed: UPPER ENDOSCOPIC ULTRASOUND (EUS) RADIAL PANCREATIC STENT PLACEMENT BALLOON DILATION  Patient Location: PACU and Endoscopy Unit  Anesthesia Type:General  Level of Consciousness: awake  Airway & Oxygen Therapy: Patient Spontanous Breathing and Patient connected to face mask oxygen  Post-op Assessment: Report given to RN and Post -op Vital signs reviewed and stable  Post vital signs: Reviewed and stable  Last Vitals:  Vitals Value Taken Time  BP    Temp    Pulse    Resp    SpO2      Last Pain:  Vitals:   03/17/22 0935  TempSrc: Temporal  PainSc: 0-No pain         Complications: No notable events documented.

## 2022-03-17 NOTE — Telephone Encounter (Signed)
Order entered for CT and sent to the schedulers

## 2022-03-17 NOTE — Discharge Instructions (Signed)
YOU HAD AN ENDOSCOPIC PROCEDURE TODAY: Refer to the procedure report and other information in the discharge instructions given to you for any specific questions about what was found during the examination. If this information does not answer your questions, please call [place] office at [phone] to clarify.  ° °YOU SHOULD EXPECT: Some feelings of bloating in the abdomen. Passage of more gas than usual. Walking can help get rid of the air that was put into your GI tract during the procedure and reduce the bloating. If you had a lower endoscopy (such as a colonoscopy or flexible sigmoidoscopy) you may notice spotting of blood in your stool or on the toilet paper. Some abdominal soreness may be present for a day or two, also. ° °DIET: Your first meal following the procedure should be a light meal and then it is ok to progress to your normal diet. A half-sandwich or bowl of soup is an example of a good first meal. Heavy or fried foods are harder to digest and may make you feel nauseous or bloated. Drink plenty of fluids but you should avoid alcoholic beverages for 24 hours. If you had a esophageal dilation, please see attached instructions for diet.   ° °ACTIVITY: Your care partner should take you home directly after the procedure. You should plan to take it easy, moving slowly for the rest of the day. You can resume normal activity the day after the procedure however YOU SHOULD NOT DRIVE, use power tools, machinery or perform tasks that involve climbing or major physical exertion for 24 hours (because of the sedation medicines used during the test).  ° °SYMPTOMS TO REPORT IMMEDIATELY: °A gastroenterologist can be reached at any hour. Please call [phone]  for any of the following symptoms:  °Following lower endoscopy (colonoscopy, flexible sigmoidoscopy) °Excessive amounts of blood in the stool  °Significant tenderness, worsening of abdominal pains  °Swelling of the abdomen that is new, acute  °Fever of 100° or  higher  °Following upper endoscopy (EGD, EUS, ERCP, esophageal dilation) °Vomiting of blood or coffee ground material  °New, significant abdominal pain  °New, significant chest pain or pain under the shoulder blades  °Painful or persistently difficult swallowing  °New shortness of breath  °Black, tarry-looking or red, bloody stools ° °FOLLOW UP:  °If any biopsies were taken you will be contacted by phone or by letter within the next 1-3 weeks. Call [phone]  if you have not heard about the biopsies in 3 weeks.  °Please also call with any specific questions about appointments or follow up tests. ° °

## 2022-03-17 NOTE — Anesthesia Postprocedure Evaluation (Signed)
Anesthesia Post Note  Patient: Densil Lare  Procedure(s) Performed: UPPER ENDOSCOPIC ULTRASOUND (EUS) RADIAL PANCREATIC STENT PLACEMENT BALLOON DILATION     Patient location during evaluation: PACU Anesthesia Type: MAC Level of consciousness: awake Pain management: pain level controlled Vital Signs Assessment: post-procedure vital signs reviewed and stable Respiratory status: spontaneous breathing, nonlabored ventilation and respiratory function stable Cardiovascular status: blood pressure returned to baseline and stable Postop Assessment: no apparent nausea or vomiting Anesthetic complications: no   No notable events documented.  Last Vitals:  Vitals:   03/17/22 1250 03/17/22 1300  BP: 103/66 106/64  Pulse: 89 86  Resp: 19 15  Temp:    SpO2: 94% 95%    Last Pain:  Vitals:   03/17/22 1300  TempSrc:   PainSc: 0-No pain   Pain Goal:                   Nilda Simmer

## 2022-03-17 NOTE — Telephone Encounter (Signed)
-----   Message from Irving Copas., MD sent at 03/17/2022  2:46 PM EST ----- Regarding: Follow-up Eric Hodge, This patient needs a CT abdomen with IV/oral contrast to be performed in approximately 2 to 3 weeks to follow-up the cystgastrostomy created today.. Tentatively schedule this patient for an EGD with stent pull in 4 to 5 weeks in the hospital.  If his CT scan shows something concerning where in which we have to do something sooner then we will have to find a slot. Thanks. GM

## 2022-03-18 ENCOUNTER — Other Ambulatory Visit: Payer: Self-pay

## 2022-03-18 DIAGNOSIS — K863 Pseudocyst of pancreas: Secondary | ICD-10-CM

## 2022-03-18 NOTE — Telephone Encounter (Signed)
Left message on machine to call back  

## 2022-03-18 NOTE — Telephone Encounter (Signed)
EGD has been scheduled for 4/11 at 145 pm at [facility] with GM

## 2022-03-20 ENCOUNTER — Encounter (HOSPITAL_COMMUNITY): Payer: Self-pay | Admitting: Gastroenterology

## 2022-03-21 ENCOUNTER — Other Ambulatory Visit: Payer: 59

## 2022-03-21 DIAGNOSIS — E785 Hyperlipidemia, unspecified: Secondary | ICD-10-CM

## 2022-03-21 DIAGNOSIS — K860 Alcohol-induced chronic pancreatitis: Secondary | ICD-10-CM

## 2022-03-21 NOTE — Telephone Encounter (Signed)
Inbound call from patient, is wishing to speak with a nurse. Patient states he had his stent placed, and is experiencing, tenderness and pain that wakes him up at night. Patient continues to take antibiotics but is experiencing cold chills but no fever. Please advise.

## 2022-03-21 NOTE — Telephone Encounter (Signed)
Patty, I called and spoke with the patient this afternoon. He relays the same history that you had gotten in regards to this abdominal discomfort that has occurred over the course of the last few days and is keeping him up at night as well.  He is tolerating an oral diet and not having significant vomiting. He did have some Streptococcus mitis that was found on his culture data but the sensitivities have not returned and it is being recultured based on the microbiology from last week cystgastrostomy creation and sampling. He will continue on ciprofloxacin. He had laboratories performed at his primary care provider office today and I looked at the white count and it is normal. His CMP is still pending. Patty, can you see about adding on an amylase and lipase to the patient's labs that were drawn. He also needs to obtain a chest x-ray two-view as well as a KUB 2 view at our office on 03/22/2022. Please go ahead and also schedule him a CT abdomen/pelvis with IV and oral contrast to further evaluate his pain post cystgastrostomy creation. Thanks. GM

## 2022-03-21 NOTE — Telephone Encounter (Signed)
Pt had EUS on 2/29 and has upper abd pain that comes and goes beginning on Saturday.  It feels like someone "twisting his stomach" and "takes his breath away"  It last about 5- 10 min. He also has some bloating. It will wake him up in the night and he has chills and unable to get warm. He is on Cipro twice daily.  Afebrile with no nausea, bleeding or other complaints.

## 2022-03-21 NOTE — Telephone Encounter (Signed)
Left message on machine to call back  

## 2022-03-21 NOTE — Telephone Encounter (Signed)
I have spoken to the pt and he will get xrays and labs tomorrow morning- We are not able to add labs to the PCP orders.    CT order entered and sent to the schedulers.

## 2022-03-22 ENCOUNTER — Other Ambulatory Visit (INDEPENDENT_AMBULATORY_CARE_PROVIDER_SITE_OTHER): Payer: 59

## 2022-03-22 ENCOUNTER — Ambulatory Visit (INDEPENDENT_AMBULATORY_CARE_PROVIDER_SITE_OTHER)
Admission: RE | Admit: 2022-03-22 | Discharge: 2022-03-22 | Disposition: A | Discharge status: Home / Self Care | Payer: 59 | Source: Ambulatory Visit | Attending: Gastroenterology | Admitting: Gastroenterology

## 2022-03-22 DIAGNOSIS — K863 Pseudocyst of pancreas: Secondary | ICD-10-CM

## 2022-03-22 LAB — CBC WITH DIFFERENTIAL/PLATELET
Absolute Monocytes: 762 cells/uL (ref 200–950)
Basophils Absolute: 52 cells/uL (ref 0–200)
Basophils Relative: 0.5 %
Eosinophils Absolute: 134 cells/uL (ref 15–500)
Eosinophils Relative: 1.3 %
HCT: 41 % (ref 38.5–50.0)
Hemoglobin: 14.1 g/dL (ref 13.2–17.1)
Lymphs Abs: 1226 cells/uL (ref 850–3900)
MCH: 30.7 pg (ref 27.0–33.0)
MCHC: 34.4 g/dL (ref 32.0–36.0)
MCV: 89.3 fL (ref 80.0–100.0)
MPV: 10.5 fL (ref 7.5–12.5)
Monocytes Relative: 7.4 %
Neutro Abs: 8127 cells/uL — ABNORMAL HIGH (ref 1500–7800)
Neutrophils Relative %: 78.9 %
Platelets: 142 10*3/uL (ref 140–400)
RBC: 4.59 10*6/uL (ref 4.20–5.80)
RDW: 15.2 % — ABNORMAL HIGH (ref 11.0–15.0)
Total Lymphocyte: 11.9 %
WBC: 10.3 10*3/uL (ref 3.8–10.8)

## 2022-03-22 LAB — COMPLETE METABOLIC PANEL WITH GFR
AG Ratio: 1.8 (calc) (ref 1.0–2.5)
ALT: 15 U/L (ref 9–46)
AST: 10 U/L (ref 10–40)
Albumin: 4.2 g/dL (ref 3.6–5.1)
Alkaline phosphatase (APISO): 92 U/L (ref 36–130)
BUN: 15 mg/dL (ref 7–25)
CO2: 25 mmol/L (ref 20–32)
Calcium: 9 mg/dL (ref 8.6–10.3)
Chloride: 106 mmol/L (ref 98–110)
Creat: 1 mg/dL (ref 0.60–1.26)
Globulin: 2.4 g/dL (calc) (ref 1.9–3.7)
Glucose, Bld: 119 mg/dL — ABNORMAL HIGH (ref 65–99)
Potassium: 4.3 mmol/L (ref 3.5–5.3)
Sodium: 139 mmol/L (ref 135–146)
Total Bilirubin: 0.9 mg/dL (ref 0.2–1.2)
Total Protein: 6.6 g/dL (ref 6.1–8.1)
eGFR: 101 mL/min/{1.73_m2} (ref >=60)

## 2022-03-22 LAB — LIPID PANEL
Cholesterol: 158 mg/dL (ref <200)
HDL: 42 mg/dL (ref >=40)
LDL Cholesterol (Calc): 94 mg/dL (calc)
Non-HDL Cholesterol (Calc): 116 mg/dL (calc) (ref <130)
Total CHOL/HDL Ratio: 3.8 (calc) (ref <5.0)
Triglycerides: 121 mg/dL (ref <150)

## 2022-03-22 LAB — LIPASE: Lipase: 5 U/L — ABNORMAL LOW (ref 11.0–59.0)

## 2022-03-22 LAB — AMYLASE: Amylase: 16 U/L — ABNORMAL LOW (ref 27–131)

## 2022-03-23 LAB — BODY FLUID CULTURE W GRAM STAIN

## 2022-03-23 LAB — ANAEROBIC CULTURE W GRAM STAIN

## 2022-03-28 ENCOUNTER — Other Ambulatory Visit: Payer: Self-pay

## 2022-03-28 MED ORDER — AMOXICILLIN-POT CLAVULANATE 875-125 MG PO TABS: 1.0000 | ORAL_TABLET | Freq: Two times a day (BID) | ORAL | 0 refills | Status: DC

## 2022-04-05 ENCOUNTER — Ambulatory Visit (HOSPITAL_COMMUNITY)
Admission: RE | Admit: 2022-04-05 | Discharge: 2022-04-05 | Disposition: A | Discharge status: Home / Self Care | Payer: 59 | Source: Ambulatory Visit | Attending: Gastroenterology | Admitting: Gastroenterology

## 2022-04-05 DIAGNOSIS — K863 Pseudocyst of pancreas: Secondary | ICD-10-CM | POA: Diagnosis not present

## 2022-04-05 MED ORDER — SODIUM CHLORIDE (PF) 0.9 % IJ SOLN
INTRAMUSCULAR | Status: AC
  Filled 2022-04-05: qty 50

## 2022-04-05 MED ORDER — IOHEXOL 9 MG/ML PO SOLN
ORAL | Status: AC
  Administered 2022-04-05: 1000 mL
  Filled 2022-04-05: qty 1000

## 2022-04-05 MED ORDER — IOHEXOL 300 MG/ML  SOLN
100.0000 mL | Freq: Once | INTRAMUSCULAR | Status: AC | PRN
  Administered 2022-04-05: 100 mL via INTRAVENOUS

## 2022-04-12 ENCOUNTER — Other Ambulatory Visit: Payer: Self-pay | Admitting: Family Medicine

## 2022-04-12 MED ORDER — TESTOSTERONE CYPIONATE 200 MG/ML IM SOLN: 200.0000 mg | INTRAMUSCULAR | 0 refills | Status: DC

## 2022-04-22 ENCOUNTER — Encounter (HOSPITAL_COMMUNITY): Payer: Self-pay | Admitting: Gastroenterology

## 2022-04-28 ENCOUNTER — Ambulatory Visit (HOSPITAL_COMMUNITY)
Admission: RE | Admit: 2022-04-28 | Discharge: 2022-04-28 | Disposition: A | Discharge status: Home / Self Care | Payer: 59 | Attending: Gastroenterology | Admitting: Gastroenterology

## 2022-04-28 ENCOUNTER — Other Ambulatory Visit: Payer: Self-pay

## 2022-04-28 ENCOUNTER — Encounter (HOSPITAL_COMMUNITY)
Admission: RE | Disposition: A | Discharge status: Home / Self Care | Payer: Self-pay | Source: Home / Self Care | Attending: Gastroenterology

## 2022-04-28 ENCOUNTER — Encounter (HOSPITAL_COMMUNITY): Payer: Self-pay | Admitting: Gastroenterology

## 2022-04-28 ENCOUNTER — Ambulatory Visit (HOSPITAL_COMMUNITY): Payer: 59 | Admitting: Anesthesiology

## 2022-04-28 ENCOUNTER — Ambulatory Visit (HOSPITAL_BASED_OUTPATIENT_CLINIC_OR_DEPARTMENT_OTHER): Payer: 59 | Admitting: Anesthesiology

## 2022-04-28 DIAGNOSIS — E785 Hyperlipidemia, unspecified: Secondary | ICD-10-CM | POA: Insufficient documentation

## 2022-04-28 DIAGNOSIS — F909 Attention-deficit hyperactivity disorder, unspecified type: Secondary | ICD-10-CM | POA: Diagnosis not present

## 2022-04-28 DIAGNOSIS — Z978 Presence of other specified devices: Secondary | ICD-10-CM

## 2022-04-28 DIAGNOSIS — K449 Diaphragmatic hernia without obstruction or gangrene: Secondary | ICD-10-CM

## 2022-04-28 DIAGNOSIS — K862 Cyst of pancreas: Secondary | ICD-10-CM | POA: Diagnosis not present

## 2022-04-28 DIAGNOSIS — K2289 Other specified disease of esophagus: Secondary | ICD-10-CM

## 2022-04-28 DIAGNOSIS — W449XXA Unspecified foreign body entering into or through a natural orifice, initial encounter: Secondary | ICD-10-CM | POA: Diagnosis not present

## 2022-04-28 DIAGNOSIS — K3189 Other diseases of stomach and duodenum: Secondary | ICD-10-CM

## 2022-04-28 DIAGNOSIS — Z4659 Encounter for fitting and adjustment of other gastrointestinal appliance and device: Secondary | ICD-10-CM

## 2022-04-28 DIAGNOSIS — K863 Pseudocyst of pancreas: Secondary | ICD-10-CM

## 2022-04-28 DIAGNOSIS — F17210 Nicotine dependence, cigarettes, uncomplicated: Secondary | ICD-10-CM | POA: Diagnosis not present

## 2022-04-28 DIAGNOSIS — K219 Gastro-esophageal reflux disease without esophagitis: Secondary | ICD-10-CM | POA: Diagnosis not present

## 2022-04-28 DIAGNOSIS — N179 Acute kidney failure, unspecified: Secondary | ICD-10-CM

## 2022-04-28 DIAGNOSIS — T182XXA Foreign body in stomach, initial encounter: Secondary | ICD-10-CM | POA: Diagnosis present

## 2022-04-28 HISTORY — PX: ESOPHAGOGASTRODUODENOSCOPY (EGD) WITH PROPOFOL: SHX5813

## 2022-04-28 HISTORY — PX: STENT REMOVAL: SHX6421

## 2022-04-28 SURGERY — ESOPHAGOGASTRODUODENOSCOPY (EGD) WITH PROPOFOL
Anesthesia: Monitor Anesthesia Care

## 2022-04-28 MED ORDER — PROPOFOL 500 MG/50ML IV EMUL
INTRAVENOUS | Status: AC
  Filled 2022-04-28: qty 50

## 2022-04-28 MED ORDER — PROPOFOL 1000 MG/100ML IV EMUL
INTRAVENOUS | Status: AC
  Filled 2022-04-28: qty 200

## 2022-04-28 MED ORDER — SODIUM CHLORIDE 0.9 % IV SOLN: INTRAVENOUS | Status: DC

## 2022-04-28 MED ORDER — PROPOFOL 10 MG/ML IV BOLUS
INTRAVENOUS | Status: DC | PRN
  Administered 2022-04-28: 30 mg via INTRAVENOUS
  Administered 2022-04-28: 20 mg via INTRAVENOUS
  Administered 2022-04-28 (×3): 30 mg via INTRAVENOUS

## 2022-04-28 MED ORDER — LIDOCAINE 2% (20 MG/ML) 5 ML SYRINGE
INTRAMUSCULAR | Status: DC | PRN
  Administered 2022-04-28: 100 mg via INTRAVENOUS

## 2022-04-28 MED ORDER — LACTATED RINGERS IV SOLN: INTRAVENOUS | Status: DC

## 2022-04-28 MED ORDER — OMEPRAZOLE 20 MG PO CPDR: 20.0000 mg | DELAYED_RELEASE_CAPSULE | Freq: Every day | ORAL | 11 refills | Status: DC

## 2022-04-28 MED ORDER — PROPOFOL 500 MG/50ML IV EMUL
INTRAVENOUS | Status: DC | PRN
  Administered 2022-04-28: 150 ug/kg/min via INTRAVENOUS

## 2022-04-28 SURGICAL SUPPLY — 15 items

## 2022-04-28 NOTE — Op Note (Signed)
[facility] Patient Name: Eric Hodge Procedure Date: 04/28/2022 MRN: [id number] Attending MD: Corliss Parish , MD, [id number] Date of Birth: March 05, 1985 CSN: [id number] Age: 37 Admit Type: Outpatient Procedure:                Upper GI endoscopy Indications:              Foreign body in the stomach, Stent removal Providers:                Corliss Parish, MD, Zoe Lan, RN, Harrington Challenger, Technician Referring MD:             Meredith Pel, Priscille Heidelberg. Pickard Medicines:                Monitored Anesthesia Care Complications:            No immediate complications. Estimated Blood Loss:     Estimated blood loss was minimal. Procedure:                Pre-Anesthesia Assessment:                           - Prior to the procedure, a History and Physical                            was performed, and patient medications and                            allergies were reviewed. The patient's tolerance of                            previous anesthesia was also reviewed. The risks                            and benefits of the procedure and the sedation                            options and risks were discussed with the patient.                            All questions were answered, and informed consent                            was obtained. Prior Anticoagulants: The patient has                            taken no anticoagulant or antiplatelet agents. ASA                            Grade Assessment: II - A patient with mild systemic                            disease. After reviewing the risks and benefits,  the patient was deemed in satisfactory condition to                            undergo the procedure.                           After obtaining informed consent, the endoscope was                            passed under direct vision. Throughout the                            procedure, the patient's blood  pressure, pulse, and                            oxygen saturations were monitored continuously. The                            [id number] ([id number]) Olympus therapeutic endoscope                            was introduced through the mouth, and advanced to                            the second part of duodenum. The upper GI endoscopy                            was accomplished without difficulty. The patient                            tolerated the procedure. Scope In: Scope Out: Findings:      No gross lesions were noted in the entire esophagus.      The Z-line was irregular and was found 40 cm from the incisors.      A 1 cm hiatal hernia was present.      A previously placed double-pigtail plastic stent as well as an AXIOS       cystgastrostomy stent was found on the posterior wall of the gastric       body. Stent removal was accomplished with a Raptor grasping device of       both stents. The cyst cavity was entered and showed very viable wall.       Mild oozing noted as would be expected.      Diffuse moderately erythematous mucosa without bleeding was found in the       entire examined stomach. Previous biopsied and negative for H. pylori.       This was not rebiopsied.      No gross lesions were noted in the duodenal bulb, in the first portion       of the duodenum and in the second portion of the duodenum. Impression:               - No gross lesions in the entire esophagus.                           - Z-line irregular, 40 cm from the incisors.                           -  1 cm hiatal hernia.                           - Pre-existing cystgastrostomy stents, removed.                           - Viable pancreatic cyst wall tissue.                           - Erythematous mucosa in the stomach. Previously                            biopsied.                           - No gross lesions in the duodenal bulb, in the                            first portion of the duodenum and in the  second                            portion of the duodenum. Moderate Sedation:      Not Applicable - Patient had care per Anesthesia. Recommendation:           - The patient will be observed post-procedure,                            until all discharge criteria are met.                           - Discharge patient to home.                           - Patient has a contact number available for                            emergencies. The signs and symptoms of potential                            delayed complications were discussed with the                            patient. Return to normal activities tomorrow.                            Written discharge instructions were provided to the                            patient.                           - Resume previous diet.                           - Continue present medications. And may restart PPI  once daily.                           - Continued alcohol abstinence is key.                           - Repeat imaging with CT abdomen pancreas protocol                            in 6 to 10 weeks to evaluate for any cyst                            recurrence. If there is cyst recurrence, then                            patient will likely need MRI/MRCP to evaluate for                            ductal disruption. Follow-up in clinic will be                            arranged for after the CT scan has been completed.                           - The findings and recommendations were discussed                            with the patient.                           - The findings and recommendations were discussed                            with the designated responsible adult. Procedure Code(s):        --- Professional ---                           [id number], Esophagogastroduodenoscopy, flexible,                            transoral; with removal of foreign body(s) Diagnosis Code(s):        --- Professional ---                            K22.89, Other specified disease of esophagus                           K44.9, Diaphragmatic hernia without obstruction or                            gangrene                           Z97.8, Presence of other specified devices  K31.89, Other diseases of stomach and duodenum                           T18.2XXA, Foreign body in stomach, initial encounter                           Z46.59, Encounter for fitting and adjustment of                            other gastrointestinal appliance and device CPT copyright 2022 [employer]. All rights reserved. The codes documented in this report are preliminary and upon coder review may  be revised to meet current compliance requirements. Corliss Parish, MD 04/28/2022 1:30:11 PM Number of Addenda: 0

## 2022-04-28 NOTE — Anesthesia Preprocedure Evaluation (Signed)
Anesthesia Evaluation  Patient identified by MRN, date of birth, ID band Patient awake    Reviewed: Allergy & Precautions, NPO status , Patient's Chart, lab work & pertinent test results  Airway Mallampati: III  TM Distance: >3 FB Neck ROM: Full    Dental  (+) Teeth Intact, Dental Advisory Given, Chipped,    Pulmonary Current Smoker and Patient abstained from smoking.   Pulmonary exam normal breath sounds clear to auscultation       Cardiovascular negative cardio ROS Normal cardiovascular exam Rhythm:Regular Rate:Normal     Neuro/Psych negative neurological ROS     GI/Hepatic ,GERD  Medicated,,(+)     substance abuse  alcohol useStent pull- panc cyst   Endo/Other  negative endocrine ROS    Renal/GU Renal disease (AKI)     Musculoskeletal negative musculoskeletal ROS (+)    Abdominal   Peds  (+) ADHD Hematology negative hematology ROS (+)   Anesthesia Other Findings Day of surgery medications reviewed with the patient.  Reproductive/Obstetrics                              Anesthesia Physical Anesthesia Plan  ASA: 3  Anesthesia Plan: MAC   Post-op Pain Management:    Induction: Intravenous  PONV Risk Score and Plan: 0 and TIVA and Treatment may vary due to age or medical condition  Airway Management Planned: Natural Airway and Simple Face Mask  Additional Equipment:   Intra-op Plan:   Post-operative Plan:   Informed Consent: I have reviewed the patients History and Physical, chart, labs and discussed the procedure including the risks, benefits and alternatives for the proposed anesthesia with the patient or authorized representative who has indicated his/her understanding and acceptance.     Dental advisory given  Plan Discussed with: CRNA  Anesthesia Plan Comments:          Anesthesia Quick Evaluation

## 2022-04-28 NOTE — Transfer of Care (Signed)
Immediate Anesthesia Transfer of Care Note  Patient: Eric Hodge  Procedure(s) Performed: ESOPHAGOGASTRODUODENOSCOPY (EGD) WITH PROPOFOL STENT REMOVAL  Patient Location: PACU  Anesthesia Type:MAC  Level of Consciousness: sedated  Airway & Oxygen Therapy: Patient Spontanous Breathing and Patient connected to face mask oxygen  Post-op Assessment: Report given to RN and Post -op Vital signs reviewed and stable  Post vital signs: Reviewed and stable  Last Vitals:  Vitals Value Taken Time  BP 130/66 04/28/22 1340  Temp 36.5 C 04/28/22 1328  Pulse 77 04/28/22 1342  Resp 24 04/28/22 1342  SpO2 98 % 04/28/22 1342  Vitals shown include unvalidated device data.  Last Pain:  Vitals:   04/28/22 1328  TempSrc: Tympanic  PainSc: 0-No pain         Complications: No notable events documented.

## 2022-04-28 NOTE — H&P (Signed)
GASTROENTEROLOGY PROCEDURE H&P NOTE   Primary Care Physician: Donita Brooks, MD  HPI: Eric Hodge is a 37 y.o. male who presents for EGD for cystgastrostomy stent pull.  Past Medical History:  Diagnosis Date   ADHD (attention deficit hyperactivity disorder)    Alcohol use disorder 09/2021   Elevated LFTs    Hyperlipidemia    Pancreatitis, alcoholic, acute 09/2021   Retained orthopedic hardware 02/2017   right distal radius   Past Surgical History:  Procedure Laterality Date   BALLOON DILATION N/A 03/17/2022   Procedure: BALLOON DILATION;  Surgeon: Meridee Score Netty Starring., MD;  Location: [facility];  Service: Gastroenterology;  Laterality: N/A;   BIOPSY  02/17/2022   Procedure: BIOPSY;  Surgeon: Meridee Score Netty Starring., MD;  Location: [facility];  Service: Gastroenterology;;   Fidela Salisbury RELEASE Right 10/06/2016   Procedure: CARPAL TUNNEL RELEASE;  Surgeon: Betha Loa, MD;  Location: [facility];  Service: Orthopedics;  Laterality: Right;   CYST GASTROSTOMY  03/17/2022   Procedure: CYST GASTROSTOMY;  Surgeon: Meridee Score Netty Starring., MD;  Location: [facility];  Service: Gastroenterology;;   ESOPHAGOGASTRODUODENOSCOPY (EGD) WITH PROPOFOL N/A 02/17/2022   Procedure: ESOPHAGOGASTRODUODENOSCOPY (EGD) WITH PROPOFOL;  Surgeon: Lemar Lofty., MD;  Location: [facility];  Service: Gastroenterology;  Laterality: N/A;   ESOPHAGOGASTRODUODENOSCOPY (EGD) WITH PROPOFOL N/A 03/17/2022   Procedure: ESOPHAGOGASTRODUODENOSCOPY (EGD) WITH PROPOFOL;  Surgeon: Meridee Score Netty Starring., MD;  Location: [facility];  Service: Gastroenterology;  Laterality: N/A;   EUS N/A 02/17/2022   Procedure: UPPER ENDOSCOPIC ULTRASOUND (EUS) RADIAL;  Surgeon: Lemar Lofty., MD;  Location: [facility];  Service: Gastroenterology;  Laterality: N/A;   EUS N/A 03/17/2022   Procedure: UPPER ENDOSCOPIC ULTRASOUND (EUS) RADIAL;  Surgeon: Lemar Lofty., MD;  Location:  [facility];  Service: Gastroenterology;  Laterality: N/A;   FOREIGN BODY REMOVAL  09/08/2011   Procedure: REMOVAL FOREIGN BODY EXTREMITY;  Surgeon: Tami Ribas, MD;  Location: [facility];  Service: Orthopedics;  Laterality: Left;  FOREIGN BODY REMOVAL LEFT LONG FINGER   HARDWARE REMOVAL Right 03/10/2017   Procedure: HARDWARE REMOVAL RIGHT WRIST;  Surgeon: Betha Loa, MD;  Location: [facility];  Service: Orthopedics;  Laterality: Right;   MASS EXCISION Left 10/29/2015   Procedure: LEFT WRIST EXCISION MASS;  Surgeon: Betha Loa, MD;  Location: [facility];  Service: Orthopedics;  Laterality: Left;  LEFT WRIST EXCISION MASS   OPEN REDUCTION INTERNAL FIXATION (ORIF) DISTAL RADIAL FRACTURE Right 10/06/2016   Procedure: OPEN REDUCTION INTERNAL FIXATION (ORIF) RIGHT DISTAL RADIUS;  Surgeon: Betha Loa, MD;  Location: [facility];  Service: Orthopedics;  Laterality: Right;   PANCREATIC STENT PLACEMENT  03/17/2022   Procedure: PANCREATIC STENT PLACEMENT;  Surgeon: Meridee Score Netty Starring., MD;  Location: [facility];  Service: Gastroenterology;;   WISDOM TOOTH EXTRACTION  2012   No current facility-administered medications for this encounter.   No current facility-administered medications for this encounter. No Known Allergies Family History  Problem Relation Age of Onset   Stroke Paternal Grandmother    Hypertension Paternal Grandmother    Cancer Paternal Grandfather    Hyperlipidemia Paternal Grandfather    Coronary artery disease Father 5       At 10 had MI --> stent. This was his 1st dx CAD   Social History   Socioeconomic History   Marital status: Divorced    Spouse name: Not on file   Number of children: Not on file   Years of education:  Not on file   Highest education level: Not on file  Occupational History   Not on file  Tobacco Use   Smoking status: Every Day    Packs/day: 0.50    Years: 10.00    Additional pack  years: 0.00    Total pack years: 5.00    Types: Cigarettes   Smokeless tobacco: Never  Vaping Use   Vaping Use: Never used  Substance and Sexual Activity   Alcohol use: Yes    Alcohol/week: 6.0 standard drinks of alcohol    Types: 6 Shots of liquor per week    Comment: 4-5 days per week   Drug use: No   Sexual activity: Not Currently  Other Topics Concern   Not on file  Social History Narrative   Not on file   Social Determinants of Health   Financial Resource Strain: Not on file  Food Insecurity: Not on file  Transportation Needs: Not on file  Physical Activity: Not on file  Stress: Not on file  Social Connections: Not on file  Intimate Partner Violence: Not on file    Physical Exam: There were no vitals filed for this visit. There is no height or weight on file to calculate BMI. GEN: NAD EYE: Sclerae anicteric ENT: MMM CV: Non-tachycardic GI: Soft, NT/ND NEURO:  Alert & Oriented x 3  Lab Results: No results for input(s): "WBC", "HGB", "HCT", "PLT" in the last 72 hours. BMET No results for input(s): "NA", "K", "CL", "CO2", "GLUCOSE", "BUN", "CREATININE", "CALCIUM" in the last 72 hours. LFT No results for input(s): "PROT", "ALBUMIN", "AST", "ALT", "ALKPHOS", "BILITOT", "BILIDIR", "IBILI" in the last 72 hours. PT/INR No results for input(s): "LABPROT", "INR" in the last 72 hours.   Impression / Plan: This is a 37 y.o.male  who presents for EGD for cystgastrostomy stent pull.   The risks and benefits of endoscopic evaluation/treatment were discussed with the patient and/or family; these include but are not limited to the risk of perforation, infection, bleeding, missed lesions, lack of diagnosis, severe illness requiring hospitalization, as well as anesthesia and sedation related illnesses.  The patient's history has been reviewed, patient examined, no change in status, and deemed stable for procedure.  The patient and/or family is agreeable to proceed.     Corliss Parish, MD [facility] Office # [phone]

## 2022-04-29 NOTE — Anesthesia Postprocedure Evaluation (Signed)
Anesthesia Post Note  Patient: Eric Hodge  Procedure(s) Performed: ESOPHAGOGASTRODUODENOSCOPY (EGD) WITH PROPOFOL STENT REMOVAL     Patient location during evaluation: Endoscopy Anesthesia Type: MAC Level of consciousness: oriented, awake and alert and awake Pain management: pain level controlled Vital Signs Assessment: post-procedure vital signs reviewed and stable Respiratory status: spontaneous breathing, nonlabored ventilation, respiratory function stable and patient connected to nasal cannula oxygen Cardiovascular status: blood pressure returned to baseline and stable Postop Assessment: no headache, no backache and no apparent nausea or vomiting Anesthetic complications: no   No notable events documented.  Last Vitals:  Vitals:   04/28/22 1338 04/28/22 1348  BP: 130/66 136/60  Pulse: 81 76  Resp: 19 (!) 21  Temp:    SpO2: 97% 97%    Last Pain:  Vitals:   04/28/22 1348  TempSrc:   PainSc: 0-No pain                 Collene Schlichter

## 2022-05-01 ENCOUNTER — Encounter (HOSPITAL_COMMUNITY): Payer: Self-pay | Admitting: Gastroenterology

## 2022-05-09 ENCOUNTER — Other Ambulatory Visit: Payer: Self-pay | Admitting: Family Medicine

## 2022-05-18 ENCOUNTER — Other Ambulatory Visit: Payer: Self-pay | Admitting: Family Medicine

## 2022-05-19 ENCOUNTER — Other Ambulatory Visit: Payer: Self-pay | Admitting: Family Medicine

## 2022-05-19 MED ORDER — TESTOSTERONE CYPIONATE 200 MG/ML IM SOLN: 200.0000 mg | INTRAMUSCULAR | 0 refills | Status: DC

## 2022-05-31 ENCOUNTER — Emergency Department (HOSPITAL_COMMUNITY): Payer: 59

## 2022-05-31 ENCOUNTER — Encounter (HOSPITAL_COMMUNITY): Payer: Self-pay

## 2022-05-31 ENCOUNTER — Inpatient Hospital Stay (HOSPITAL_COMMUNITY)
Admission: EM | Admit: 2022-05-31 | Discharge: 2022-06-04 | DRG: 439 | Disposition: A | Discharge status: Home / Self Care | Payer: 59 | Attending: Internal Medicine | Admitting: Internal Medicine

## 2022-05-31 ENCOUNTER — Other Ambulatory Visit: Payer: Self-pay

## 2022-05-31 DIAGNOSIS — I82891 Chronic embolism and thrombosis of other specified veins: Secondary | ICD-10-CM | POA: Diagnosis present

## 2022-05-31 DIAGNOSIS — R161 Splenomegaly, not elsewhere classified: Secondary | ICD-10-CM | POA: Diagnosis present

## 2022-05-31 DIAGNOSIS — F1090 Alcohol use, unspecified, uncomplicated: Secondary | ICD-10-CM | POA: Diagnosis not present

## 2022-05-31 DIAGNOSIS — Z8249 Family history of ischemic heart disease and other diseases of the circulatory system: Secondary | ICD-10-CM

## 2022-05-31 DIAGNOSIS — Z6836 Body mass index (BMI) 36.0-36.9, adult: Secondary | ICD-10-CM | POA: Diagnosis not present

## 2022-05-31 DIAGNOSIS — F17210 Nicotine dependence, cigarettes, uncomplicated: Secondary | ICD-10-CM | POA: Diagnosis present

## 2022-05-31 DIAGNOSIS — K859 Acute pancreatitis without necrosis or infection, unspecified: Secondary | ICD-10-CM | POA: Diagnosis present

## 2022-05-31 DIAGNOSIS — K760 Fatty (change of) liver, not elsewhere classified: Secondary | ICD-10-CM | POA: Diagnosis present

## 2022-05-31 DIAGNOSIS — F1010 Alcohol abuse, uncomplicated: Secondary | ICD-10-CM | POA: Diagnosis present

## 2022-05-31 DIAGNOSIS — Z83438 Family history of other disorder of lipoprotein metabolism and other lipidemia: Secondary | ICD-10-CM | POA: Diagnosis not present

## 2022-05-31 DIAGNOSIS — Z809 Family history of malignant neoplasm, unspecified: Secondary | ICD-10-CM | POA: Diagnosis not present

## 2022-05-31 DIAGNOSIS — R030 Elevated blood-pressure reading, without diagnosis of hypertension: Secondary | ICD-10-CM | POA: Diagnosis present

## 2022-05-31 DIAGNOSIS — K852 Alcohol induced acute pancreatitis without necrosis or infection: Principal | ICD-10-CM | POA: Diagnosis present

## 2022-05-31 DIAGNOSIS — E785 Hyperlipidemia, unspecified: Secondary | ICD-10-CM | POA: Diagnosis present

## 2022-05-31 DIAGNOSIS — K861 Other chronic pancreatitis: Secondary | ICD-10-CM | POA: Diagnosis present

## 2022-05-31 DIAGNOSIS — E669 Obesity, unspecified: Secondary | ICD-10-CM | POA: Diagnosis present

## 2022-05-31 DIAGNOSIS — Z79899 Other long term (current) drug therapy: Secondary | ICD-10-CM

## 2022-05-31 DIAGNOSIS — Z823 Family history of stroke: Secondary | ICD-10-CM

## 2022-05-31 DIAGNOSIS — I10 Essential (primary) hypertension: Secondary | ICD-10-CM | POA: Diagnosis not present

## 2022-05-31 LAB — I-STAT CHEM 8, ED
BUN: 12 mg/dL (ref 6–20)
Calcium, Ion: 1.16 mmol/L (ref 1.15–1.40)
Chloride: 107 mmol/L (ref 98–111)
Creatinine, Ser: 1 mg/dL (ref 0.61–1.24)
Glucose, Bld: 125 mg/dL — ABNORMAL HIGH (ref 70–99)
HCT: 48 % (ref 39.0–52.0)
Hemoglobin: 16.3 g/dL (ref 13.0–17.0)
Potassium: 4.3 mmol/L (ref 3.5–5.1)
Sodium: 139 mmol/L (ref 135–145)
TCO2: 21 mmol/L — ABNORMAL LOW (ref 22–32)

## 2022-05-31 LAB — COMPREHENSIVE METABOLIC PANEL
ALT: 51 U/L — ABNORMAL HIGH (ref 0–44)
AST: 36 U/L (ref 15–41)
Albumin: 4.7 g/dL (ref 3.5–5.0)
Alkaline Phosphatase: 119 U/L (ref 38–126)
Anion gap: 13 (ref 5–15)
BUN: 11 mg/dL (ref 6–20)
CO2: 21 mmol/L — ABNORMAL LOW (ref 22–32)
Calcium: 9.6 mg/dL (ref 8.9–10.3)
Chloride: 105 mmol/L (ref 98–111)
Creatinine, Ser: 1.17 mg/dL (ref 0.61–1.24)
GFR, Estimated: >60 mL/min (ref >60)
Glucose, Bld: 123 mg/dL — ABNORMAL HIGH (ref 70–99)
Potassium: 4 mmol/L (ref 3.5–5.1)
Sodium: 139 mmol/L (ref 135–145)
Total Bilirubin: 1.2 mg/dL (ref 0.3–1.2)
Total Protein: 7.6 g/dL (ref 6.5–8.1)

## 2022-05-31 LAB — CBC WITH DIFFERENTIAL/PLATELET
Abs Immature Granulocytes: 0.1 10*3/uL — ABNORMAL HIGH (ref 0.00–0.07)
Basophils Absolute: 0.1 10*3/uL (ref 0.0–0.1)
Basophils Relative: 0 %
Eosinophils Absolute: 0.1 10*3/uL (ref 0.0–0.5)
Eosinophils Relative: 1 %
HCT: 46.4 % (ref 39.0–52.0)
Hemoglobin: 16.3 g/dL (ref 13.0–17.0)
Immature Granulocytes: 1 %
Lymphocytes Relative: 10 %
Lymphs Abs: 1.3 10*3/uL (ref 0.7–4.0)
MCH: 31.3 pg (ref 26.0–34.0)
MCHC: 35.1 g/dL (ref 30.0–36.0)
MCV: 89.2 fL (ref 80.0–100.0)
Monocytes Absolute: 0.7 10*3/uL (ref 0.1–1.0)
Monocytes Relative: 5 %
Neutro Abs: 10.7 10*3/uL — ABNORMAL HIGH (ref 1.7–7.7)
Neutrophils Relative %: 83 %
Platelets: 184 10*3/uL (ref 150–400)
RBC: 5.2 MIL/uL (ref 4.22–5.81)
RDW: 14.3 % (ref 11.5–15.5)
WBC: 12.9 10*3/uL — ABNORMAL HIGH (ref 4.0–10.5)
nRBC: 0 % (ref 0.0–0.2)

## 2022-05-31 LAB — LIPASE, BLOOD: Lipase: 126 U/L — ABNORMAL HIGH (ref 11–51)

## 2022-05-31 MED ORDER — IOHEXOL 350 MG/ML SOLN
75.0000 mL | Freq: Once | INTRAVENOUS | Status: AC | PRN
  Administered 2022-05-31: 75 mL via INTRAVENOUS

## 2022-05-31 MED ORDER — LORAZEPAM 2 MG/ML IJ SOLN: 1.0000 mg | INTRAMUSCULAR | Status: AC | PRN

## 2022-05-31 MED ORDER — HYDROMORPHONE HCL 1 MG/ML IJ SOLN
1.0000 mg | INTRAMUSCULAR | Status: DC | PRN
  Administered 2022-05-31 – 2022-06-04 (×36): 1 mg via INTRAVENOUS
  Filled 2022-05-31 (×38): qty 1

## 2022-05-31 MED ORDER — HYDROMORPHONE HCL 1 MG/ML IJ SOLN
1.0000 mg | Freq: Once | INTRAMUSCULAR | Status: AC
  Administered 2022-05-31: 1 mg via INTRAVENOUS
  Filled 2022-05-31: qty 1

## 2022-05-31 MED ORDER — ADULT MULTIVITAMIN W/MINERALS CH
1.0000 | ORAL_TABLET | Freq: Every day | ORAL | Status: DC
  Administered 2022-06-01 – 2022-06-04 (×4): 1 via ORAL
  Filled 2022-05-31 (×4): qty 1

## 2022-05-31 MED ORDER — THIAMINE MONONITRATE 100 MG PO TABS
100.0000 mg | ORAL_TABLET | Freq: Every day | ORAL | Status: DC
  Administered 2022-06-01 – 2022-06-04 (×4): 100 mg via ORAL
  Filled 2022-05-31 (×4): qty 1

## 2022-05-31 MED ORDER — HYDROCODONE-ACETAMINOPHEN 5-325 MG PO TABS
2.0000 | ORAL_TABLET | Freq: Once | ORAL | Status: AC
  Administered 2022-05-31: 2 via ORAL
  Filled 2022-05-31: qty 2

## 2022-05-31 MED ORDER — MORPHINE SULFATE (PF) 4 MG/ML IV SOLN
4.0000 mg | Freq: Once | INTRAVENOUS | Status: AC
  Administered 2022-05-31: 4 mg via INTRAVENOUS
  Filled 2022-05-31: qty 1

## 2022-05-31 MED ORDER — THIAMINE HCL 100 MG/ML IJ SOLN: 100.0000 mg | Freq: Every day | INTRAMUSCULAR | Status: DC

## 2022-05-31 MED ORDER — ONDANSETRON HCL 4 MG/2ML IJ SOLN
4.0000 mg | Freq: Once | INTRAMUSCULAR | Status: AC
  Administered 2022-05-31: 4 mg via INTRAVENOUS
  Filled 2022-05-31: qty 2

## 2022-05-31 MED ORDER — FOLIC ACID 1 MG PO TABS
1.0000 mg | ORAL_TABLET | Freq: Every day | ORAL | Status: DC
  Administered 2022-06-01 – 2022-06-04 (×4): 1 mg via ORAL
  Filled 2022-05-31 (×4): qty 1

## 2022-05-31 MED ORDER — LORAZEPAM 1 MG PO TABS: 1.0000 mg | ORAL_TABLET | ORAL | Status: AC | PRN

## 2022-05-31 MED ORDER — SODIUM CHLORIDE 0.9 % IV BOLUS
1000.0000 mL | Freq: Once | INTRAVENOUS | Status: AC
  Administered 2022-05-31: 1000 mL via INTRAVENOUS

## 2022-05-31 NOTE — ED Provider Triage Note (Signed)
Emergency Medicine Provider Triage Evaluation Note  Eric Hodge , a 37 y.o. male  was evaluated in triage.  Pt complains of severe abd pain. Hx of etoh pancreatitis. Feels the same. Began this morning. No vomitiing. Began drinking again 2 weeks ago..  Review of Systems  Positive: Abd pain  Negative: vomiting  Physical Exam  BP (!) 173/111 (BP Location: Right Arm)   Pulse (!) 107   Temp 98.5 F (36.9 C) (Oral)   Resp 20   SpO2 97%  Gen:   Awake, no distress   Resp:  Normal effort  MSK:   Moves extremities without difficulty Other:  Appears extremely uncomfortable. TTP Epigastrim  Medical Decision Making  Medically screening exam initiated at 4:38 PM.  Appropriate orders placed.  Eric Hodge was informed that the remainder of the evaluation will be completed by another provider, this initial triage assessment does not replace that evaluation, and the importance of remaining in the ED until their evaluation is complete.     Arthor Captain, PA-C 05/31/22 [phone]

## 2022-05-31 NOTE — ED Notes (Signed)
ED TO INPATIENT HANDOFF REPORT  ED Nurse Name and Phone #: Venita Sheffield RN [phone]  S Name/Age/Gender Eric Hodge 37 y.o. male Room/Bed: H011C/H011C  Code Status   Code Status: Prior  Home/SNF/Other Home Patient oriented to: self, place, time, and situation Is this baseline? Yes   Triage Complete: Triage complete  Chief Complaint Acute pancreatitis [K85.90]  Triage Note Pt c/o abdominal pain. Pt states pain is similar to previous pancreatitis incidents. Pt states that approximately two weeks ago he began drinking alcohol again. Pt denies N/V/D.    Allergies No Known Allergies  Level of Care/Admitting Diagnosis ED Disposition     ED Disposition  Admit   Condition  --   Comment  Hospital Area: [facility] [[id number]]  Level of Care: Progressive [102]  Admit to Progressive based on following criteria: GI, ENDOCRINE disease patients with GI bleeding, acute liver failure or pancreatitis, stable with diabetic ketoacidosis or thyrotoxicosis (hypothyroid) state.  May admit patient to [facility] or [facility] if equivalent level of care is available:: Yes  Covid Evaluation: Asymptomatic - no recent exposure (last 10 days) testing not required  Diagnosis: Acute pancreatitis [577.0.ICD-9-CM]  Admitting Physician: John Giovanni [[id number]]  Attending Physician: John Giovanni [[id number]]  Certification:: I certify this patient will need inpatient services for at least 2 midnights  Estimated Length of Stay: 2          B Medical/Surgery History Past Medical History:  Diagnosis Date   ADHD (attention deficit hyperactivity disorder)    Alcohol use disorder 09/2021   Elevated LFTs    Hyperlipidemia    Pancreatitis, alcoholic, acute 09/2021   Retained orthopedic hardware 02/2017   right distal radius   Past Surgical History:  Procedure Laterality Date   BALLOON DILATION N/A 03/17/2022   Procedure: BALLOON DILATION;  Surgeon: Lemar Lofty., MD;  Location: [facility];  Service: Gastroenterology;  Laterality: N/A;   BIOPSY  02/17/2022   Procedure: BIOPSY;  Surgeon: Meridee Score Netty Starring., MD;  Location: [facility];  Service: Gastroenterology;;   Fidela Salisbury RELEASE Right 10/06/2016   Procedure: CARPAL TUNNEL RELEASE;  Surgeon: Betha Loa, MD;  Location: [facility];  Service: Orthopedics;  Laterality: Right;   CYST GASTROSTOMY  03/17/2022   Procedure: CYST GASTROSTOMY;  Surgeon: Meridee Score Netty Starring., MD;  Location: [facility];  Service: Gastroenterology;;   ESOPHAGOGASTRODUODENOSCOPY (EGD) WITH PROPOFOL N/A 02/17/2022   Procedure: ESOPHAGOGASTRODUODENOSCOPY (EGD) WITH PROPOFOL;  Surgeon: Lemar Lofty., MD;  Location: [facility];  Service: Gastroenterology;  Laterality: N/A;   ESOPHAGOGASTRODUODENOSCOPY (EGD) WITH PROPOFOL N/A 03/17/2022   Procedure: ESOPHAGOGASTRODUODENOSCOPY (EGD) WITH PROPOFOL;  Surgeon: Meridee Score Netty Starring., MD;  Location: [facility];  Service: Gastroenterology;  Laterality: N/A;   ESOPHAGOGASTRODUODENOSCOPY (EGD) WITH PROPOFOL N/A 04/28/2022   Procedure: ESOPHAGOGASTRODUODENOSCOPY (EGD) WITH PROPOFOL;  Surgeon: Meridee Score Netty Starring., MD;  Location: [facility];  Service: Gastroenterology;  Laterality: N/A;   EUS N/A 02/17/2022   Procedure: UPPER ENDOSCOPIC ULTRASOUND (EUS) RADIAL;  Surgeon: Lemar Lofty., MD;  Location: [facility];  Service: Gastroenterology;  Laterality: N/A;   EUS N/A 03/17/2022   Procedure: UPPER ENDOSCOPIC ULTRASOUND (EUS) RADIAL;  Surgeon: Lemar Lofty., MD;  Location: [facility];  Service: Gastroenterology;  Laterality: N/A;   FOREIGN BODY REMOVAL  09/08/2011   Procedure: REMOVAL FOREIGN BODY EXTREMITY;  Surgeon: Tami Ribas, MD;  Location: [facility];  Service: Orthopedics;  Laterality: Left;  FOREIGN BODY REMOVAL LEFT LONG FINGER   HARDWARE REMOVAL Right  03/10/2017   Procedure: HARDWARE REMOVAL RIGHT  WRIST;  Surgeon: Betha Loa, MD;  Location: [facility];  Service: Orthopedics;  Laterality: Right;   MASS EXCISION Left 10/29/2015   Procedure: LEFT WRIST EXCISION MASS;  Surgeon: Betha Loa, MD;  Location: [facility];  Service: Orthopedics;  Laterality: Left;  LEFT WRIST EXCISION MASS   OPEN REDUCTION INTERNAL FIXATION (ORIF) DISTAL RADIAL FRACTURE Right 10/06/2016   Procedure: OPEN REDUCTION INTERNAL FIXATION (ORIF) RIGHT DISTAL RADIUS;  Surgeon: Betha Loa, MD;  Location:  SURGERY CENTER;  Service: Orthopedics;  Laterality: Right;   PANCREATIC STENT PLACEMENT  03/17/2022   Procedure: PANCREATIC STENT PLACEMENT;  Surgeon: Lemar Lofty., MD;  Location: [facility];  Service: Gastroenterology;;   Francine Graven REMOVAL  04/28/2022   Procedure: STENT REMOVAL;  Surgeon: Lemar Lofty., MD;  Location: [facility];  Service: Gastroenterology;;   WISDOM TOOTH EXTRACTION  2012     A IV Location/Drains/Wounds Patient Lines/Drains/Airways Status     Active Line/Drains/Airways     Name Placement date Placement time Site Days   Peripheral IV 05/31/22 20 G 1" Posterior;Right Hand 05/31/22  1652  Hand  less than 1   GI Stent 03/17/22  1159  --  75   GI Stent 03/17/22  1222  --  75            Intake/Output Last 24 hours  Intake/Output Summary (Last 24 hours) at 05/31/2022 2239 Last data filed at 05/31/2022 2237 Gross per 24 hour  Intake 1000 ml  Output --  Net 1000 ml    Labs/Imaging Results for orders placed or performed during the hospital encounter of 05/31/22 (from the past 48 hour(s))  I-stat chem 8, ED (not at [facility], [facility] or [facility]     Status: Abnormal   Collection Time: 05/31/22  4:49 PM  Result Value Ref Range   Sodium 139 135 - 145 mmol/L   Potassium 4.3 3.5 - 5.1 mmol/L   Chloride 107 98 - 111 mmol/L   BUN 12 6 - 20 mg/dL   Creatinine, Ser 1.61 0.61 - 1.24 mg/dL   Glucose, Bld 096 (H) 70 - 99 mg/dL    Comment: Glucose  reference range applies only to samples taken after fasting for at least 8 hours.   Calcium, Ion 1.16 1.15 - 1.40 mmol/L   TCO2 21 (L) 22 - 32 mmol/L   Hemoglobin 16.3 13.0 - 17.0 g/dL   HCT 04.5 40.9 - 81.1 %  Comprehensive metabolic panel     Status: Abnormal   Collection Time: 05/31/22  5:44 PM  Result Value Ref Range   Sodium 139 135 - 145 mmol/L   Potassium 4.0 3.5 - 5.1 mmol/L   Chloride 105 98 - 111 mmol/L   CO2 21 (L) 22 - 32 mmol/L   Glucose, Bld 123 (H) 70 - 99 mg/dL    Comment: Glucose reference range applies only to samples taken after fasting for at least 8 hours.   BUN 11 6 - 20 mg/dL   Creatinine, Ser 9.14 0.61 - 1.24 mg/dL   Calcium 9.6 8.9 - 78.2 mg/dL   Total Protein 7.6 6.5 - 8.1 g/dL   Albumin 4.7 3.5 - 5.0 g/dL   AST 36 15 - 41 U/L   ALT 51 (H) 0 - 44 U/L   Alkaline Phosphatase 119 38 - 126 U/L   Total Bilirubin 1.2 0.3 - 1.2 mg/dL   GFR, Estimated >95 >62 mL/min    Comment: (  NOTE) Calculated using the CKD-EPI Creatinine Equation (2021)    Anion gap 13 5 - 15    Comment: Performed at [facility], [address], [place], [place] [address]  CBC with Differential     Status: Abnormal   Collection Time: 05/31/22  5:44 PM  Result Value Ref Range   WBC 12.9 (H) 4.0 - 10.5 K/uL   RBC 5.20 4.22 - 5.81 MIL/uL   Hemoglobin 16.3 13.0 - 17.0 g/dL   HCT 28.4 13.2 - 44.0 %   MCV 89.2 80.0 - 100.0 fL   MCH 31.3 26.0 - 34.0 pg   MCHC 35.1 30.0 - 36.0 g/dL   RDW 10.2 72.5 - 36.6 %   Platelets 184 150 - 400 K/uL   nRBC 0.0 0.0 - 0.2 %   Neutrophils Relative % 83 %   Neutro Abs 10.7 (H) 1.7 - 7.7 K/uL   Lymphocytes Relative 10 %   Lymphs Abs 1.3 0.7 - 4.0 K/uL   Monocytes Relative 5 %   Monocytes Absolute 0.7 0.1 - 1.0 K/uL   Eosinophils Relative 1 %   Eosinophils Absolute 0.1 0.0 - 0.5 K/uL   Basophils Relative 0 %   Basophils Absolute 0.1 0.0 - 0.1 K/uL   Immature Granulocytes 1 %   Abs Immature Granulocytes 0.10 (H) 0.00 - 0.07 K/uL    Comment:  Performed at [facility], [address] [place], [place] [address]  Lipase, blood     Status: Abnormal   Collection Time: 05/31/22  5:44 PM  Result Value Ref Range   Lipase 126 (H) 11 - 51 U/L    Comment: Performed at [facility], [address] [place], [place] [address]   CT ABDOMEN PELVIS W CONTRAST  Result Date: 05/31/2022 CLINICAL DATA:  Abdominal pain, acute (Ped 0-17y) EXAM: CT ABDOMEN AND PELVIS WITH CONTRAST TECHNIQUE: Multidetector CT imaging of the abdomen and pelvis was performed using the standard protocol following bolus administration of intravenous contrast. RADIATION DOSE REDUCTION: This exam was performed according to the departmental dose-optimization program which includes automated exposure control, adjustment of the mA and/or kV according to patient size and/or use of iterative reconstruction technique. CONTRAST:  75mL OMNIPAQUE IOHEXOL 350 MG/ML SOLN COMPARISON:  04/05/2022 FINDINGS: Lower chest: No acute findings Hepatobiliary: Is Diffuse low-density throughout the liver compatible with fatty infiltration. No focal abnormality. Gallbladder unremarkable. Pancreas: Extensive inflammatory stranding noted around the pancreatic head, uncinate process and body compatible with acute pancreatitis. Spleen: Splenomegaly with craniocaudal length 14.3 cm. 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 Adrenals/Urinary Tract: No adrenal abnormality. No focal renal abnormality. No stones or hydronephrosis. Urinary bladder is unremarkable. Stomach/Bowel: Stomach, large and small bowel grossly unremarkable. Normal appendix. Vascular/Lymphatic: No evidence of aneurysm or adenopathy. Reproductive: No visible focal abnormality. Other: No free fluid or free air. Musculoskeletal: No acute bony abnormality. IMPRESSION: Extensive stranding around the pancreatic head, uncinate process and body compatible with acute pancreatitis. Hepatic steatosis. 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 Splenomegaly. Electronically Signed   By: Charlett Nose M.D.   On: 05/31/2022 20:43    Pending Labs Unresulted Labs (From admission, onward)    None       Vitals/Pain Today's Vitals   05/31/22 2156 05/31/22 2233 05/31/22 2236 05/31/22 2237  BP: (!) 155/104 (!) 167/103  (!) 167/103  Pulse: (!) 55 (!) 53  64  Resp: (!) 23   (!) 24  Temp:  TempSrc:      SpO2: 100%   98%  Weight:      Height:      PainSc:   9      Isolation Precautions No active isolations  Medications Medications  LORazepam (ATIVAN) tablet 1-4 mg (has no administration in time range)    Or  LORazepam (ATIVAN) injection 1-4 mg (has no administration in time range)  thiamine (VITAMIN B1) tablet 100 mg (has no administration in time range)    Or  thiamine (VITAMIN B1) injection 100 mg (has no administration in time range)  folic acid (FOLVITE) tablet 1 mg (has no administration in time range)  multivitamin with minerals tablet 1 tablet (has no administration in time range)  HYDROmorphone (DILAUDID) injection 1 mg (has no administration in time range)  HYDROcodone-acetaminophen (NORCO/VICODIN) 5-325 MG per tablet 2 tablet (2 tablets Oral Given 05/31/22 1647)  iohexol (OMNIPAQUE) 350 MG/ML injection 75 mL (75 mLs Intravenous Contrast Given 05/31/22 2032)  morphine (PF) 4 MG/ML injection 4 mg (4 mg Intravenous Given 05/31/22 2114)  ondansetron (ZOFRAN) injection 4 mg (4 mg Intravenous Given 05/31/22 2113)  sodium chloride 0.9 % bolus 1,000 mL (0 mLs Intravenous Stopped 05/31/22 2237)  HYDROmorphone (DILAUDID) injection 1 mg (1 mg Intravenous Given 05/31/22 2236)    Mobility walks     Focused Assessments Patient reports waking up this AM with mild abdominal pain, took some Motrin for discomfort. Reports pain increased to untolerable amount. Reports having pancreatitis from alcohol in the past. Was sober for  a few months and recently started  drinking again. Reports last drink last night. CIWA 0.     R Recommendations: See Admitting Provider Note  Report given to:   Additional Notes:

## 2022-05-31 NOTE — ED Triage Notes (Signed)
Pt c/o abdominal pain. Pt states pain is similar to previous pancreatitis incidents. Pt states that approximately two weeks ago he began drinking alcohol again. Pt denies N/V/D.

## 2022-05-31 NOTE — ED Provider Notes (Signed)
[place] EMERGENCY DEPARTMENT AT [facility] Provider Note   CSN: [id number] Arrival date & time: 05/31/22  1610     History  Chief Complaint  Patient presents with   Abdominal Pain    Eric Hodge is a 37 y.o. male.  Patient presents to the emergency department complaining of severe epigastric abdominal pain.  Patient states he has a history of pancreatitis and endorses this pain feeling similar to previous incidents.  Patient endorses resuming alcohol consumption approximately 2 weeks ago.  He endorses nausea but denies vomiting or diarrhea.  Past medical history significant for chronic pancreatitis, alcohol use disorder, elevated LFTs, hyperlipidemia  HPI     Home Medications Prior to Admission medications   Medication Sig Start Date End Date Taking? Authorizing Provider  ibuprofen (ADVIL) 200 MG tablet Take 200 mg by mouth every 6 (six) hours as needed for mild pain.   Yes [provider]  Multiple Vitamin (MULTIVITAMIN WITH MINERALS) TABS tablet Take 1 tablet by mouth in the morning.   Yes [provider]  omeprazole (PRILOSEC) 20 MG capsule Take 1 capsule (20 mg total) by mouth daily. Patient taking differently: Take 20 mg by mouth daily as needed (heart burn). 04/28/22  Yes Mansouraty, Netty Starring., MD  rosuvastatin (CRESTOR) 10 MG tablet Take 1 tablet (10 mg total) by mouth daily. Patient taking differently: Take 10 mg by mouth at bedtime. 12/14/21  Yes Donita Brooks, MD  testosterone cypionate (DEPOTESTOSTERONE CYPIONATE) 200 MG/ML injection Inject 1 mL (200 mg total) into the muscle every 14 (fourteen) days. 05/19/22  Yes Donita Brooks, MD  montelukast (SINGULAIR) 10 MG tablet TAKE 1 TABLET(10 MG) BY MOUTH DAILY AS NEEDED FOR ALLERGIES Patient not taking: Reported on 01/19/2022 11/05/20   Donita Brooks, MD      Allergies    Patient has no known allergies.    Review of Systems   Review of Systems  Physical Exam Updated Vital  Signs BP (!) 155/104 (BP Location: Left Arm)   Pulse (!) 55   Temp 98.3 F (36.8 C)   Resp (!) 23   Ht 5\' 10"  (1.778 m)   Wt 113.4 kg   SpO2 100%   BMI 35.87 kg/m  Physical Exam Vitals and nursing note reviewed.  Constitutional:      General: He is in acute distress.     Appearance: He is well-developed.  HENT:     Head: Normocephalic and atraumatic.  Eyes:     Conjunctiva/sclera: Conjunctivae normal.  Cardiovascular:     Rate and Rhythm: Normal rate and regular rhythm.     Heart sounds: No murmur heard. Pulmonary:     Effort: Pulmonary effort is normal. No respiratory distress.     Breath sounds: Normal breath sounds.  Abdominal:     Palpations: Abdomen is soft.     Tenderness: There is abdominal tenderness in the epigastric area.  Musculoskeletal:        General: No swelling.     Cervical back: Neck supple.  Skin:    General: Skin is warm and dry.     Capillary Refill: Capillary refill takes less than 2 seconds.  Neurological:     Mental Status: He is alert.  Psychiatric:        Mood and Affect: Mood normal.     ED Results / Procedures / Treatments   Labs (all labs ordered are listed, but only abnormal results are displayed) Labs Reviewed  COMPREHENSIVE METABOLIC PANEL -  Abnormal; Notable for the following components:      Result Value   CO2 21 (*)    Glucose, Bld 123 (*)    ALT 51 (*)    All other components within normal limits  CBC WITH DIFFERENTIAL/PLATELET - Abnormal; Notable for the following components:   WBC 12.9 (*)    Neutro Abs 10.7 (*)    Abs Immature Granulocytes 0.10 (*)    All other components within normal limits  LIPASE, BLOOD - Abnormal; Notable for the following components:   Lipase 126 (*)    All other components within normal limits  I-STAT CHEM 8, ED - Abnormal; Notable for the following components:   Glucose, Bld 125 (*)    TCO2 21 (*)    All other components within normal limits    EKG None  Radiology CT ABDOMEN PELVIS W  CONTRAST  Result Date: 05/31/2022 CLINICAL DATA:  Abdominal pain, acute (Ped 0-17y) EXAM: CT ABDOMEN AND PELVIS WITH CONTRAST TECHNIQUE: Multidetector CT imaging of the abdomen and pelvis was performed using the standard protocol following bolus administration of intravenous contrast. RADIATION DOSE REDUCTION: This exam was performed according to the departmental dose-optimization program which includes automated exposure control, adjustment of the mA and/or kV according to patient size and/or use of iterative reconstruction technique. CONTRAST:  75mL OMNIPAQUE IOHEXOL 350 MG/ML SOLN COMPARISON:  04/05/2022 FINDINGS: Lower chest: No acute findings Hepatobiliary: Is Diffuse low-density throughout the liver compatible with fatty infiltration. No focal abnormality. Gallbladder unremarkable. Pancreas: Extensive inflammatory stranding noted around the pancreatic head, uncinate process and body compatible with acute pancreatitis. Spleen: Splenomegaly with craniocaudal length 14.3 cm. 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 Adrenals/Urinary Tract: No adrenal abnormality. No focal renal abnormality. No stones or hydronephrosis. Urinary bladder is unremarkable. Stomach/Bowel: Stomach, large and small bowel grossly unremarkable. Normal appendix. Vascular/Lymphatic: No evidence of aneurysm or adenopathy. Reproductive: No visible focal abnormality. Other: No free fluid or free air. Musculoskeletal: No acute bony abnormality. IMPRESSION: Extensive stranding around the pancreatic head, uncinate process and body compatible with acute pancreatitis. Hepatic steatosis. 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 Splenomegaly. Electronically Signed   By: Charlett Nose M.D.   On: 05/31/2022 20:43    Procedures Procedures    Medications Ordered in ED Medications  HYDROmorphone (DILAUDID) injection 1 mg (has no administration in time range)  LORazepam  (ATIVAN) tablet 1-4 mg (has no administration in time range)    Or  LORazepam (ATIVAN) injection 1-4 mg (has no administration in time range)  thiamine (VITAMIN B1) tablet 100 mg (has no administration in time range)    Or  thiamine (VITAMIN B1) injection 100 mg (has no administration in time range)  folic acid (FOLVITE) tablet 1 mg (has no administration in time range)  multivitamin with minerals tablet 1 tablet (has no administration in time range)  HYDROmorphone (DILAUDID) injection 1 mg (has no administration in time range)  HYDROcodone-acetaminophen (NORCO/VICODIN) 5-325 MG per tablet 2 tablet (2 tablets Oral Given 05/31/22 1647)  iohexol (OMNIPAQUE) 350 MG/ML injection 75 mL (75 mLs Intravenous Contrast Given 05/31/22 2032)  morphine (PF) 4 MG/ML injection 4 mg (4 mg Intravenous Given 05/31/22 2114)  ondansetron (ZOFRAN) injection 4 mg (4 mg Intravenous Given 05/31/22 2113)  sodium chloride 0.9 % bolus 1,000 mL (1,000 mLs Intravenous New Bag/Given 05/31/22 2112)    ED Course/ Medical Decision Making/ A&P  Medical Decision Making Risk OTC drugs. Prescription drug management. Decision regarding hospitalization.   This patient presents to the ED for concern of abdominal pain, this involves an extensive number of treatment options, and is a complaint that carries with it a high risk of complications and morbidity.  The differential diagnosis includes pancreatitis, cholecystitis, gastritis, appendicitis, others   Co morbidities that complicate the patient evaluation  History of alcohol induced pancreatitis   Additional history obtained:   External records from outside source obtained and reviewed including H&P from February 29 of this year.  Patient presented for EGD for attempted endoscopic cystogastrostomy creation   Lab Tests:  I Ordered, and personally interpreted labs.  The pertinent results include: Lipase 126, WBC 12.9   Imaging Studies  ordered:  I ordered imaging studies including CT abdomen pelvis with contrast I independently visualized and interpreted imaging which showed  Extensive stranding around the pancreatic head, uncinate process and  body compatible with acute pancreatitis.    Hepatic steatosis.    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 Splenomegaly.   I agree with the radiologist interpretation    Consultations Obtained:  I requested consultation with the hospitalist, Dr.Rathore  and discussed lab and imaging findings as well as pertinent plan - they recommend: admission, requested orders be placed for CIWA protocol and PRN pain medication   Problem List / ED Course / Critical interventions / Medication management   I ordered medication including morphine for pain, Zofran for nausea, normal saline for fluid resuscitation, dilaudid for pain, ativan PRN for withdrawal symptoms Reevaluation of the patient after these medicines showed that the patient improved I have reviewed the patients home medicines and have made adjustments as needed    Test / Admission - Considered:  Patient with acute alcohol induced pancreatitis.  Patient needs admission for pain management, IV fluids, nausea medication.          Final Clinical Impression(s) / ED Diagnoses Final diagnoses:  Alcohol-induced acute pancreatitis, unspecified complication status    Rx / DC Orders ED Discharge Orders     None         Pamala Duffel 05/31/22 2213    Terrilee Files, MD 06/01/22 1704

## 2022-06-01 DIAGNOSIS — K852 Alcohol induced acute pancreatitis without necrosis or infection: Secondary | ICD-10-CM

## 2022-06-01 DIAGNOSIS — F1090 Alcohol use, unspecified, uncomplicated: Secondary | ICD-10-CM

## 2022-06-01 LAB — COMPREHENSIVE METABOLIC PANEL
ALT: 41 U/L (ref 0–44)
AST: 27 U/L (ref 15–41)
Albumin: 3.9 g/dL (ref 3.5–5.0)
Alkaline Phosphatase: 114 U/L (ref 38–126)
Anion gap: 11 (ref 5–15)
BUN: 7 mg/dL (ref 6–20)
CO2: 20 mmol/L — ABNORMAL LOW (ref 22–32)
Calcium: 8.7 mg/dL — ABNORMAL LOW (ref 8.9–10.3)
Chloride: 106 mmol/L (ref 98–111)
Creatinine, Ser: 0.99 mg/dL (ref 0.61–1.24)
GFR, Estimated: >60 mL/min (ref >60)
Glucose, Bld: 138 mg/dL — ABNORMAL HIGH (ref 70–99)
Potassium: 3.7 mmol/L (ref 3.5–5.1)
Sodium: 137 mmol/L (ref 135–145)
Total Bilirubin: 1.2 mg/dL (ref 0.3–1.2)
Total Protein: 6.8 g/dL (ref 6.5–8.1)

## 2022-06-01 LAB — CBC
HCT: 43 % (ref 39.0–52.0)
Hemoglobin: 15.4 g/dL (ref 13.0–17.0)
MCH: 31.4 pg (ref 26.0–34.0)
MCHC: 35.8 g/dL (ref 30.0–36.0)
MCV: 87.8 fL (ref 80.0–100.0)
Platelets: 139 10*3/uL — ABNORMAL LOW (ref 150–400)
RBC: 4.9 MIL/uL (ref 4.22–5.81)
RDW: 14.3 % (ref 11.5–15.5)
WBC: 13.2 10*3/uL — ABNORMAL HIGH (ref 4.0–10.5)
nRBC: 0 % (ref 0.0–0.2)

## 2022-06-01 LAB — GLUCOSE, CAPILLARY
Glucose-Capillary: 120 mg/dL — ABNORMAL HIGH (ref 70–99)
Glucose-Capillary: 139 mg/dL — ABNORMAL HIGH (ref 70–99)
Glucose-Capillary: 138 mg/dL — ABNORMAL HIGH (ref 70–99)
Glucose-Capillary: 131 mg/dL — ABNORMAL HIGH (ref 70–99)

## 2022-06-01 LAB — LIPASE, BLOOD: Lipase: 212 U/L — ABNORMAL HIGH (ref 11–51)

## 2022-06-01 MED ORDER — OXYCODONE HCL 5 MG PO TABS
5.0000 mg | ORAL_TABLET | ORAL | Status: DC | PRN
  Administered 2022-06-01 – 2022-06-04 (×2): 5 mg via ORAL
  Filled 2022-06-01 (×2): qty 1

## 2022-06-01 MED ORDER — ROSUVASTATIN CALCIUM 5 MG PO TABS: 10.0000 mg | ORAL_TABLET | Freq: Every day | ORAL | Status: DC

## 2022-06-01 MED ORDER — ENOXAPARIN SODIUM 40 MG/0.4ML IJ SOSY
40.0000 mg | PREFILLED_SYRINGE | INTRAMUSCULAR | Status: DC
  Administered 2022-06-01 – 2022-06-03 (×3): 40 mg via SUBCUTANEOUS
  Filled 2022-06-01 (×3): qty 0.4

## 2022-06-01 MED ORDER — NALOXONE HCL 0.4 MG/ML IJ SOLN: 0.4000 mg | INTRAMUSCULAR | Status: DC | PRN

## 2022-06-01 MED ORDER — ONDANSETRON HCL 4 MG/2ML IJ SOLN
4.0000 mg | Freq: Four times a day (QID) | INTRAMUSCULAR | Status: DC | PRN
  Administered 2022-06-02 (×2): 4 mg via INTRAVENOUS
  Filled 2022-06-01 (×2): qty 2

## 2022-06-01 MED ORDER — SODIUM CHLORIDE 0.9 % IV SOLN: INTRAVENOUS | Status: DC

## 2022-06-01 MED ORDER — HYDRALAZINE HCL 20 MG/ML IJ SOLN: 10.0000 mg | Freq: Four times a day (QID) | INTRAMUSCULAR | Status: DC | PRN

## 2022-06-01 NOTE — H&P (Signed)
History and Physical    Eric Hodge:[id number] DOB: 05-Aug-1985 DOA: 05/31/2022  PCP: Donita Brooks, MD  Patient coming from: Home  Chief Complaint: Abdominal pain  HPI: Eric Hodge is a 37 y.o. male with medical history significant of alcohol use disorder, alcoholic pancreatitis, hyperlipidemia, GERD, ADHD, obesity.  Patient presents to the ED complaining of severe epigastric abdominal pain.  Hypertensive and slightly tachycardic on arrival.  Afebrile.  Labs showing WBC 12.9, hemoglobin 16.3, platelet count 184k, sodium 139, potassium 4.0, chloride 105, bicarb 21, BUN 11, creatinine 1.1, glucose 123, AST 36, ALT 51, alk phos 119, T. bili 1.2, lipase 126.     CT abdomen pelvis with contrast: "IMPRESSION: Extensive stranding around the pancreatic head, uncinate process and body compatible with acute pancreatitis.   Hepatic steatosis.   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 Splenomegaly."   Patient received Norco, morphine, Dilaudid, Zofran, and 1 L normal saline bolus.  Started on CIWA protocol.  [facility] called to admit.   Patient states he had pancreatitis in September last year and had stopped drinking alcohol after that, however, about 3 to 4 weeks ago he started drinking heavily again.  He is drinking half a bottle of liquor a day, last drink was yesterday night.  Since this morning he is having severe epigastric abdominal pain which radiates to his bilateral upper abdominal quadrants and to his back.  He had some nausea when he went for his CT scan but denies any episodes of vomiting.  Denies fevers or chills.  Review of Systems:  Review of Systems  All other systems reviewed and are negative.   Past Medical History:  Diagnosis Date   ADHD (attention deficit hyperactivity disorder)    Alcohol use disorder 09/2021   Elevated LFTs    Hyperlipidemia    Pancreatitis, alcoholic, acute 09/2021   Retained orthopedic  hardware 02/2017   right distal radius    Past Surgical History:  Procedure Laterality Date   BALLOON DILATION N/A 03/17/2022   Procedure: BALLOON DILATION;  Surgeon: Meridee Score Netty Starring., MD;  Location: [facility];  Service: Gastroenterology;  Laterality: N/A;   BIOPSY  02/17/2022   Procedure: BIOPSY;  Surgeon: Meridee Score Netty Starring., MD;  Location: [facility];  Service: Gastroenterology;;   Fidela Salisbury RELEASE Right 10/06/2016   Procedure: CARPAL TUNNEL RELEASE;  Surgeon: Betha Loa, MD;  Location: [facility];  Service: Orthopedics;  Laterality: Right;   CYST GASTROSTOMY  03/17/2022   Procedure: CYST GASTROSTOMY;  Surgeon: Meridee Score Netty Starring., MD;  Location: [facility];  Service: Gastroenterology;;   ESOPHAGOGASTRODUODENOSCOPY (EGD) WITH PROPOFOL N/A 02/17/2022   Procedure: ESOPHAGOGASTRODUODENOSCOPY (EGD) WITH PROPOFOL;  Surgeon: Lemar Lofty., MD;  Location: [facility];  Service: Gastroenterology;  Laterality: N/A;   ESOPHAGOGASTRODUODENOSCOPY (EGD) WITH PROPOFOL N/A 03/17/2022   Procedure: ESOPHAGOGASTRODUODENOSCOPY (EGD) WITH PROPOFOL;  Surgeon: Meridee Score Netty Starring., MD;  Location: [facility];  Service: Gastroenterology;  Laterality: N/A;   ESOPHAGOGASTRODUODENOSCOPY (EGD) WITH PROPOFOL N/A 04/28/2022   Procedure: ESOPHAGOGASTRODUODENOSCOPY (EGD) WITH PROPOFOL;  Surgeon: Meridee Score Netty Starring., MD;  Location: [facility];  Service: Gastroenterology;  Laterality: N/A;   EUS N/A 02/17/2022   Procedure: UPPER ENDOSCOPIC ULTRASOUND (EUS) RADIAL;  Surgeon: Lemar Lofty., MD;  Location: [facility];  Service: Gastroenterology;  Laterality: N/A;   EUS N/A 03/17/2022   Procedure: UPPER ENDOSCOPIC ULTRASOUND (EUS) RADIAL;  Surgeon: Lemar Lofty., MD;  Location: [facility];  Service: Gastroenterology;  Laterality: N/A;   FOREIGN  BODY REMOVAL  09/08/2011   Procedure: REMOVAL FOREIGN BODY EXTREMITY;  Surgeon: Tami Ribas, MD;   Location: [facility];  Service: Orthopedics;  Laterality: Left;  FOREIGN BODY REMOVAL LEFT LONG FINGER   HARDWARE REMOVAL Right 03/10/2017   Procedure: HARDWARE REMOVAL RIGHT WRIST;  Surgeon: Betha Loa, MD;  Location: [facility];  Service: Orthopedics;  Laterality: Right;   MASS EXCISION Left 10/29/2015   Procedure: LEFT WRIST EXCISION MASS;  Surgeon: Betha Loa, MD;  Location: [facility];  Service: Orthopedics;  Laterality: Left;  LEFT WRIST EXCISION MASS   OPEN REDUCTION INTERNAL FIXATION (ORIF) DISTAL RADIAL FRACTURE Right 10/06/2016   Procedure: OPEN REDUCTION INTERNAL FIXATION (ORIF) RIGHT DISTAL RADIUS;  Surgeon: Betha Loa, MD;  Location: [facility];  Service: Orthopedics;  Laterality: Right;   PANCREATIC STENT PLACEMENT  03/17/2022   Procedure: PANCREATIC STENT PLACEMENT;  Surgeon: Lemar Lofty., MD;  Location: [facility];  Service: Gastroenterology;;   Francine Graven REMOVAL  04/28/2022   Procedure: STENT REMOVAL;  Surgeon: Lemar Lofty., MD;  Location: [facility];  Service: Gastroenterology;;   WISDOM TOOTH EXTRACTION  2012     reports that he has been smoking cigarettes. He has a 5.00 pack-year smoking history. He has never used smokeless tobacco. He reports current alcohol use of about 6.0 standard drinks of alcohol per week. He reports that he does not use drugs.  No Known Allergies  Family History  Problem Relation Age of Onset   Stroke Paternal Grandmother    Hypertension Paternal Grandmother    Cancer Paternal Grandfather    Hyperlipidemia Paternal Grandfather    Coronary artery disease Father 72       At 8 had MI --> stent. 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 Authorizing Provider  ibuprofen (ADVIL) 200 MG tablet Take 200 mg by mouth every 6 (six) hours as needed for mild pain.   Yes [provider]  Multiple Vitamin  (MULTIVITAMIN WITH MINERALS) TABS tablet Take 1 tablet by mouth in the morning.   Yes [provider]  omeprazole (PRILOSEC) 20 MG capsule Take 1 capsule (20 mg total) by mouth daily. Patient taking differently: Take 20 mg by mouth daily as needed (heart burn). 04/28/22  Yes Mansouraty, Netty Starring., MD  rosuvastatin (CRESTOR) 10 MG tablet Take 1 tablet (10 mg total) by mouth daily. Patient taking differently: Take 10 mg by mouth at bedtime. 12/14/21  Yes Donita Brooks, MD  testosterone cypionate (DEPOTESTOSTERONE CYPIONATE) 200 MG/ML injection Inject 1 mL (200 mg total) into the muscle every 14 (fourteen) days. 05/19/22  Yes Donita Brooks, MD  montelukast (SINGULAIR) 10 MG tablet TAKE 1 TABLET(10 MG) BY MOUTH DAILY AS NEEDED FOR ALLERGIES Patient not taking: Reported on 01/19/2022 11/05/20   Donita Brooks, MD    Physical Exam: Vitals:   05/31/22 2156 05/31/22 2233 05/31/22 2237 05/31/22 2334  BP: (!) 155/104 (!) 167/103 (!) 167/103 (!) 155/90  Pulse: (!) 55 (!) 53 64 61  Resp: (!) 23  (!) 24   Temp:    (!) 97.5 F (36.4 C)  TempSrc:    Oral  SpO2: 100%  98%   Weight:      Height:        Physical Exam Vitals reviewed.  Constitutional:      General: He is not in acute distress. HENT:  Head: Normocephalic and atraumatic.  Eyes:     Extraocular Movements: Extraocular movements intact.  Cardiovascular:     Rate and Rhythm: Normal rate and regular rhythm.     Pulses: Normal pulses.  Pulmonary:     Effort: Pulmonary effort is normal. No respiratory distress.     Breath sounds: Normal breath sounds. No wheezing or rales.  Abdominal:     General: Bowel sounds are normal. There is no distension.     Palpations: Abdomen is soft.     Tenderness: There is abdominal tenderness.     Comments: Epigastrium tender to palpation  Musculoskeletal:     Cervical back: Normal range of motion.     Right lower leg: No edema.     Left lower leg: No edema.  Skin:    General:  Skin is warm and dry.  Neurological:     General: No focal deficit present.     Mental Status: He is alert and oriented to person, place, and time.     Labs on Admission: I have personally reviewed following labs and imaging studies  CBC: Recent Labs  Lab 05/31/22 1649 05/31/22 1744  WBC  --  12.9*  NEUTROABS  --  10.7*  HGB 16.3 16.3  HCT 48.0 46.4  MCV  --  89.2  PLT  --  184   Basic Metabolic Panel: Recent Labs  Lab 05/31/22 1649 05/31/22 1744  NA 139 139  K 4.3 4.0  CL 107 105  CO2  --  21*  GLUCOSE 125* 123*  BUN 12 11  CREATININE 1.00 1.17  CALCIUM  --  9.6   GFR: Estimated Creatinine Clearance: 112.2 mL/min (by C-G formula based on SCr of 1.17 mg/dL). Liver Function Tests: Recent Labs  Lab 05/31/22 1744  AST 36  ALT 51*  ALKPHOS 119  BILITOT 1.2  PROT 7.6  ALBUMIN 4.7   Recent Labs  Lab 05/31/22 1744  LIPASE 126*   No results for input(s): "AMMONIA" in the last 168 hours. Coagulation Profile: No results for input(s): "INR", "PROTIME" in the last 168 hours. Cardiac Enzymes: No results for input(s): "CKTOTAL", "CKMB", "CKMBINDEX", "TROPONINI" in the last 168 hours. BNP (last 3 results) No results for input(s): "PROBNP" in the last 8760 hours. HbA1C: No results for input(s): "HGBA1C" in the last 72 hours. CBG: No results for input(s): "GLUCAP" in the last 168 hours. Lipid Profile: No results for input(s): "CHOL", "HDL", "LDLCALC", "TRIG", "CHOLHDL", "LDLDIRECT" in the last 72 hours. Thyroid Function Tests: No results for input(s): "TSH", "T4TOTAL", "FREET4", "T3FREE", "THYROIDAB" in the last 72 hours. Anemia Panel: No results for input(s): "VITAMINB12", "FOLATE", "FERRITIN", "TIBC", "IRON", "RETICCTPCT" in the last 72 hours. Urine analysis:    Component Value Date/Time   COLORURINE YELLOW 01/05/2022 0856   APPEARANCEUR CLEAR 01/05/2022 0856   LABSPEC >1.046 (H) 01/05/2022 0856   PHURINE 6.0 01/05/2022 0856   GLUCOSEU NEGATIVE 01/05/2022  0856   HGBUR NEGATIVE 01/05/2022 0856   BILIRUBINUR NEGATIVE 01/05/2022 0856   KETONESUR 20 (A) 01/05/2022 0856   PROTEINUR 30 (A) 01/05/2022 0856   NITRITE NEGATIVE 01/05/2022 0856   LEUKOCYTESUR NEGATIVE 01/05/2022 0856    Radiological Exams on Admission: CT ABDOMEN PELVIS W CONTRAST  Result Date: 05/31/2022 CLINICAL DATA:  Abdominal pain, acute (Ped 0-17y) EXAM: CT ABDOMEN AND PELVIS WITH CONTRAST TECHNIQUE: Multidetector CT imaging of the abdomen and pelvis was performed using the standard protocol following bolus administration of intravenous contrast. RADIATION DOSE REDUCTION: This exam was performed according  to the departmental dose-optimization program which includes automated exposure control, adjustment of the mA and/or kV according to patient size and/or use of iterative reconstruction technique. CONTRAST:  75mL OMNIPAQUE IOHEXOL 350 MG/ML SOLN COMPARISON:  04/05/2022 FINDINGS: Lower chest: No acute findings Hepatobiliary: Is Diffuse low-density throughout the liver compatible with fatty infiltration. No focal abnormality. Gallbladder unremarkable. Pancreas: Extensive inflammatory stranding noted around the pancreatic head, uncinate process and body compatible with acute pancreatitis. Spleen: Splenomegaly with craniocaudal length 14.3 cm. 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 Adrenals/Urinary Tract: No adrenal abnormality. No focal renal abnormality. No stones or hydronephrosis. Urinary bladder is unremarkable. Stomach/Bowel: Stomach, large and small bowel grossly unremarkable. Normal appendix. Vascular/Lymphatic: No evidence of aneurysm or adenopathy. Reproductive: No visible focal abnormality. Other: No free fluid or free air. Musculoskeletal: No acute bony abnormality. IMPRESSION: Extensive stranding around the pancreatic head, uncinate process and body compatible with acute pancreatitis. Hepatic steatosis. Chronic splenic vein thrombosis with extensive  collaterals in the left upper quadrant. Splenomegaly. Electronically Signed   By: Charlett Nose M.D.   On: 05/31/2022 20:43    EKG: Independently reviewed.  Sinus rhythm with sinus arrhythmia, no significant change since prior tracing.  Assessment and Plan  Acute alcoholic pancreatitis Patient is presenting with severe epigastric abdominal pain.  Reportedly had stopped drinking alcohol last year in September and then about 3 or 4 weeks ago he started drinking heavily again.  WBC count 12.9, no fever or signs of sepsis at this time.  Lipase elevated at 126.  CT showing extensive stranding around the pancreatic head, uncinate process, and body compatible with acute pancreatitis.  Keep n.p.o. at this time and continue IV fluid hydration, pain control, antiemetic as needed.  Trend lipase and WBC count.  Patient has been counseled to quit drinking alcohol.  Alcohol use disorder No signs of withdrawal at this time.  Started on CIWA protocol; Ativan as needed.  Thiamine, folate, and multivitamin.  DVT prophylaxis: Lovenox Code Status: Full Code (discussed with the patient) Level of care: Progressive Care Unit Admission status: It is my clinical opinion that admission to INPATIENT is reasonable and necessary because of the expectation that this patient will require hospital care that crosses at least 2 midnights to treat this condition based on the medical complexity of the problems presented.  Given the aforementioned information, the predictability of an adverse outcome is felt to be significant.   John Giovanni MD [facility]  If 7PM-7AM, please contact night-coverage [email or web address]  06/01/2022, 12:47 AM

## 2022-06-01 NOTE — Progress Notes (Signed)
[facility]                                                                              Eric Hodge, is a 37 y.o. male, DOB - March 28, 1985, ZOX:[id number] Admit date - 05/31/2022    Outpatient Primary MD for the patient is Pickard, Priscille Heidelberg, MD  LOS - 1  days  Chief Complaint  Patient presents with   Abdominal Pain       Brief summary   Patient is a 37 year old male with alcohol use disorder, prior history of alcoholic pancreatitis, hyperlipidemia, GERD, ADHD, obesity presented with severe epigastric abdominal pain, hypertensive and slightly tachycardic on arrival. Patient states he had pancreatitis in September last year and had stopped drinking alcohol after that, however, about 3 to 4 weeks ago he started drinking heavily again.  He is drinking half a bottle of liquor a day, last drink was at night before the admission.   WBCs 12.9, hemoglobin 16.3, AST 36, ALT 51, alk phos 119, total bilirubin 1.2, lipase 126. CT abdomen pelvis with contrast showed extensive stranding around the pancreatic head, uncinate process and body compatible with acute pancreatitis.  Hepatic steatosis.  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   Assessment & Plan    Principal Problem:   Acute alcoholic pancreatitis -Complaining of significant pain, Dilaudid not helping -Placed on p.o. oxycodone for breakthrough pain -N.p.o. status, IV fluids, pain control, antiemetics -Counseled on alcohol cessation  Active Problems:   Alcohol use disorder -Continue Ativan with CIWA protocol -Continue thiamine, folate -Currently stable, high risk of withdrawals  Acute transaminitis -Improving, CT abdomen pelvis showed hepatic steatosis  Elevated BP readings -Likely due to pain, will place on IV hydralazine as needed with parameters  Obesity Estimated body mass index is 36.79 kg/m as calculated from the following:   Height as of  this encounter: 5\' 10"  (1.778 m).   Weight as of this encounter: 116.3 kg.  Code Status: Full code DVT Prophylaxis:  enoxaparin (LOVENOX) injection 40 mg Start: 06/01/22 1400   Level of Care: Level of care: Progressive Family Communication: Updated patient Disposition Plan:      Remains inpatient appropriate:      Procedures:  None  Consultants:   None  Antimicrobials:  Medications  enoxaparin (LOVENOX) injection  40 mg Subcutaneous Q24H   folic acid  1 mg Oral Daily   multivitamin with minerals  1 tablet Oral Daily   thiamine  100 mg Oral Daily   Or   thiamine  100 mg Intravenous Daily      Subjective:   [name] was seen and examined today.  Complaining of severe epigastric abdominal pain, states Dilaudid alone is not helping.  No acute nausea vomiting, diarrhea.  No chest pain, shortness of breath.  Alert and oriented x 3.    Objective:   Vitals:   05/31/22 2334 06/01/22 0253 06/01/22 0808 06/01/22 1129  BP: (!) 155/90 (!) 151/91 (!) 157/102 (!) 145/85  Pulse: 61 (!) 58 86 68  Resp:  15 (!) 21 16  Temp: (!) 97.5 F (36.4 C) 98.3 F (  36.8 C) 98.2 F (36.8 C) 98.1 F (36.7 C)  TempSrc: Oral Oral Oral Oral  SpO2:  95% 91% 92%  Weight:  116.3 kg    Height:  5\' 10"  (1.778 m)      Intake/Output Summary (Last 24 hours) at 06/01/2022 1448 Last data filed at 05/31/2022 2237 Gross per 24 hour  Intake 1000 ml  Output --  Net 1000 ml     Wt Readings from Last 3 Encounters:  06/01/22 116.3 kg  04/28/22 108.9 kg  03/17/22 110.7 kg     Exam General: Alert and oriented x 3, NAD Cardiovascular: S1 S2 auscultated,  RRR Respiratory: Clear to auscultation bilaterally, no wheezing Gastrointestinal: Soft, epigastric TTP , nondistended, + bowel sounds Ext: no pedal edema bilaterally Neuro: no new deficits Psych: Normal affect     Data Reviewed:  I have personally reviewed following labs    CBC Lab Results  Component Value Date   WBC 13.2 (H)  06/01/2022   RBC 4.90 06/01/2022   HGB 15.4 06/01/2022   HCT 43.0 06/01/2022   MCV 87.8 06/01/2022   MCH 31.4 06/01/2022   PLT 139 (L) 06/01/2022   MCHC 35.8 06/01/2022   RDW 14.3 06/01/2022   LYMPHSABS 1.3 05/31/2022   MONOABS 0.7 05/31/2022   EOSABS 0.1 05/31/2022   BASOSABS 0.1 05/31/2022     Last metabolic panel Lab Results  Component Value Date   NA 137 06/01/2022   K 3.7 06/01/2022   CL 106 06/01/2022   CO2 20 (L) 06/01/2022   BUN 7 06/01/2022   CREATININE 0.99 06/01/2022   GLUCOSE 138 (H) 06/01/2022   GFRNONAA >60 06/01/2022   GFRAA 110 10/31/2019   CALCIUM 8.7 (L) 06/01/2022   PROT 6.8 06/01/2022   ALBUMIN 3.9 06/01/2022   BILITOT 1.2 06/01/2022   ALKPHOS 114 06/01/2022   AST 27 06/01/2022   ALT 41 06/01/2022   ANIONGAP 11 06/01/2022    CBG (last 3)  Recent Labs    06/01/22 0118 06/01/22 0616 06/01/22 1127  GLUCAP 120* 139* 138*      Coagulation Profile: No results for input(s): "INR", "PROTIME" in the last 168 hours.   Radiology Studies: I have personally reviewed the imaging studies  CT ABDOMEN PELVIS W CONTRAST  Result Date: 05/31/2022 CLINICAL DATA:  Abdominal pain, acute (Ped 0-17y) EXAM: CT ABDOMEN AND PELVIS WITH CONTRAST TECHNIQUE: Multidetector CT imaging of the abdomen and pelvis was performed using the standard protocol following bolus administration of intravenous contrast. RADIATION DOSE REDUCTION: This exam was performed according to the departmental dose-optimization program which includes automated exposure control, adjustment of the mA and/or kV according to patient size and/or use of iterative reconstruction technique. CONTRAST:  75mL OMNIPAQUE IOHEXOL 350 MG/ML SOLN COMPARISON:  04/05/2022 FINDINGS: Lower chest: No acute findings Hepatobiliary: Is Diffuse low-density throughout the liver compatible with fatty infiltration. No focal abnormality. Gallbladder unremarkable. Pancreas: Extensive inflammatory stranding noted around the  pancreatic head, uncinate process and body compatible with acute pancreatitis. Spleen: Splenomegaly with craniocaudal length 14.3 cm. 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 Adrenals/Urinary Tract: No adrenal abnormality. No focal renal abnormality. No stones or hydronephrosis. Urinary bladder is unremarkable. Stomach/Bowel: Stomach, large and small bowel grossly unremarkable. Normal appendix. Vascular/Lymphatic: No evidence of aneurysm or adenopathy. Reproductive: No visible focal abnormality. Other: No free fluid or free air. Musculoskeletal: No acute bony abnormality. IMPRESSION: Extensive stranding around the pancreatic head, uncinate process and body compatible with acute pancreatitis. Hepatic steatosis. Chronic splenic vein thrombosis  with extensive collaterals in the left upper quadrant. Splenomegaly. Electronically Signed   By: Charlett Nose M.D.   On: 05/31/2022 20:43       Corri Delapaz M.D. [facility] 06/01/2022, 2:48 PM  Available via Epic secure chat 7am-7pm After 7 pm, please refer to night coverage provider listed on amion.

## 2022-06-02 DIAGNOSIS — I10 Essential (primary) hypertension: Secondary | ICD-10-CM | POA: Diagnosis not present

## 2022-06-02 DIAGNOSIS — K852 Alcohol induced acute pancreatitis without necrosis or infection: Secondary | ICD-10-CM | POA: Diagnosis not present

## 2022-06-02 DIAGNOSIS — F1090 Alcohol use, unspecified, uncomplicated: Secondary | ICD-10-CM | POA: Diagnosis not present

## 2022-06-02 LAB — URINALYSIS, ROUTINE W REFLEX MICROSCOPIC
Bacteria, UA: NONE SEEN
Bilirubin Urine: NEGATIVE
Glucose, UA: NEGATIVE mg/dL
Ketones, ur: 80 mg/dL — AB
Leukocytes,Ua: NEGATIVE
Nitrite: NEGATIVE
Protein, ur: >=300 mg/dL — AB
Specific Gravity, Urine: 1.023 (ref 1.005–1.030)
pH: 6 (ref 5.0–8.0)

## 2022-06-02 LAB — GLUCOSE, CAPILLARY
Glucose-Capillary: 129 mg/dL — ABNORMAL HIGH (ref 70–99)
Glucose-Capillary: 136 mg/dL — ABNORMAL HIGH (ref 70–99)
Glucose-Capillary: 138 mg/dL — ABNORMAL HIGH (ref 70–99)
Glucose-Capillary: 145 mg/dL — ABNORMAL HIGH (ref 70–99)

## 2022-06-02 MED ORDER — METOPROLOL TARTRATE 12.5 MG HALF TABLET
12.5000 mg | ORAL_TABLET | Freq: Two times a day (BID) | ORAL | Status: DC
  Administered 2022-06-02 – 2022-06-03 (×3): 12.5 mg via ORAL
  Filled 2022-06-02 (×3): qty 1

## 2022-06-02 NOTE — Progress Notes (Signed)
[facility]                                                                              Eric Hodge, is a 37 y.o. male, DOB - 20-Jan-1985, ZOX:[id number] Admit date - 05/31/2022    Outpatient Primary MD for the patient is Tanya Nones, Priscille Heidelberg, MD  LOS - 2  days  Chief Complaint  Patient presents with   Abdominal Pain       Brief summary   Patient is a 37 year old male with alcohol use disorder, prior history of alcoholic pancreatitis, hyperlipidemia, GERD, ADHD, obesity presented with severe epigastric abdominal pain, hypertensive and slightly tachycardic on arrival. Patient states he had pancreatitis in September last year and had stopped drinking alcohol after that, however, about 3 to 4 weeks ago he started drinking heavily again.  He is drinking half a bottle of liquor a day, last drink was at night before the admission.   WBCs 12.9, hemoglobin 16.3, AST 36, ALT 51, alk phos 119, total bilirubin 1.2, lipase 126. CT abdomen pelvis with contrast showed extensive stranding around the pancreatic head, uncinate process and body compatible with acute pancreatitis.  Hepatic steatosis.  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   Assessment & Plan    Principal Problem:   Acute alcoholic pancreatitis -Improving, continue n.p.o. status, IV fluids, pain control, antiemetics  -Counseled on alcohol cessation -Triglycerides 121, lipase 212 on 5/15, pending today  Active Problems:   Alcohol use disorder -Continue Ativan with CIWA protocol -Continue thiamine, folate -Currently stable, continue CIWA protocol  Acute transaminitis Resolved  CT abdomen pelvis showed hepatic steatosis  Elevated BP readings -Likely due to pain, will place on IV hydralazine as needed with parameters -Placed on Lopressor 12.5 mg twice daily  Obesity Estimated body mass index is 36.79 kg/m as calculated from the following:    Height as of this encounter: 5\' 10"  (1.778 m).   Weight as of this encounter: 116.3 kg.  Code Status: Full code DVT Prophylaxis:  enoxaparin (LOVENOX) injection 40 mg Start: 06/01/22 1400   Level of Care: Level of care: Progressive Family Communication: Updated patient Disposition Plan:      Remains inpatient appropriate:      Procedures:  None  Consultants:   None  Antimicrobials:  Medications  enoxaparin (LOVENOX) injection  40 mg Subcutaneous Q24H   folic acid  1 mg Oral Daily   multivitamin with minerals  1 tablet Oral Daily   thiamine  100 mg Oral Daily   Or   thiamine  100 mg Intravenous Daily      Subjective:   Eric Hodge was seen and examined today.  BP readings elevated, pain improving, controlled with current regimen.  No acute nausea vomiting, fevers.    Objective:   Vitals:   06/01/22 2320 06/02/22 0313 06/02/22 0404 06/02/22 1158  BP: (!) 153/99 (!) 158/98 (!) 155/95 (!) 157/98  Pulse: (!) 109 (!) 103  99  Resp: 19 18 18    Temp: 99.8 F (37.7 C) 99.5 F (37.5 C) 99 F (37.2 C) 99.5 F (37.5 C)  TempSrc: Oral  Oral Oral Oral  SpO2: 93% 91%  93%  Weight:      Height:       No intake or output data in the 24 hours ending 06/02/22 1418    Wt Readings from Last 3 Encounters:  06/01/22 116.3 kg  04/28/22 108.9 kg  03/17/22 110.7 kg   Physical Exam General: Alert and oriented x 3, NAD Cardiovascular: S1 S2 clear, RRR.  Respiratory: CTAB, no wheezing Gastrointestinal: Soft, epigastric TTP, nondistended, NBS Ext: no pedal edema bilaterally Psych: Normal affect    Data Reviewed:  I have personally reviewed following labs    CBC Lab Results  Component Value Date   WBC 13.2 (H) 06/01/2022   RBC 4.90 06/01/2022   HGB 15.4 06/01/2022   HCT 43.0 06/01/2022   MCV 87.8 06/01/2022   MCH 31.4 06/01/2022   PLT 139 (L) 06/01/2022   MCHC 35.8 06/01/2022   RDW 14.3 06/01/2022   LYMPHSABS 1.3 05/31/2022   MONOABS 0.7 05/31/2022   EOSABS  0.1 05/31/2022   BASOSABS 0.1 05/31/2022     Last metabolic panel Lab Results  Component Value Date   NA 137 06/01/2022   K 3.7 06/01/2022   CL 106 06/01/2022   CO2 20 (L) 06/01/2022   BUN 7 06/01/2022   CREATININE 0.99 06/01/2022   GLUCOSE 138 (H) 06/01/2022   GFRNONAA >60 06/01/2022   GFRAA 110 10/31/2019   CALCIUM 8.7 (L) 06/01/2022   PROT 6.8 06/01/2022   ALBUMIN 3.9 06/01/2022   BILITOT 1.2 06/01/2022   ALKPHOS 114 06/01/2022   AST 27 06/01/2022   ALT 41 06/01/2022   ANIONGAP 11 06/01/2022    CBG (last 3)  Recent Labs    06/01/22 2320 06/02/22 0821 06/02/22 1158  GLUCAP 131* 145* 138*      Coagulation Profile: No results for input(s): "INR", "PROTIME" in the last 168 hours.   Radiology Studies: I have personally reviewed the imaging studies  CT ABDOMEN PELVIS W CONTRAST  Result Date: 05/31/2022 CLINICAL DATA:  Abdominal pain, acute (Ped 0-17y) EXAM: CT ABDOMEN AND PELVIS WITH CONTRAST TECHNIQUE: Multidetector CT imaging of the abdomen and pelvis was performed using the standard protocol following bolus administration of intravenous contrast. RADIATION DOSE REDUCTION: This exam was performed according to the departmental dose-optimization program which includes automated exposure control, adjustment of the mA and/or kV according to patient size and/or use of iterative reconstruction technique. CONTRAST:  75mL OMNIPAQUE IOHEXOL 350 MG/ML SOLN COMPARISON:  04/05/2022 FINDINGS: Lower chest: No acute findings Hepatobiliary: Is Diffuse low-density throughout the liver compatible with fatty infiltration. No focal abnormality. Gallbladder unremarkable. Pancreas: Extensive inflammatory stranding noted around the pancreatic head, uncinate process and body compatible with acute pancreatitis. Spleen: Splenomegaly with craniocaudal length 14.3 cm. 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 Adrenals/Urinary Tract: No adrenal abnormality. No focal renal  abnormality. No stones or hydronephrosis. Urinary bladder is unremarkable. Stomach/Bowel: Stomach, large and small bowel grossly unremarkable. Normal appendix. Vascular/Lymphatic: No evidence of aneurysm or adenopathy. Reproductive: No visible focal abnormality. Other: No free fluid or free air. Musculoskeletal: No acute bony abnormality. IMPRESSION: Extensive stranding around the pancreatic head, uncinate process and body compatible with acute pancreatitis. Hepatic steatosis. 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 Splenomegaly. Electronically Signed   By: Charlett Nose M.D.   On: 05/31/2022 20:43       Caela Huot M.D. [facility] 06/02/2022, 2:18 PM  Available via Epic secure chat 7am-7pm After 7 pm, please  refer to night coverage provider listed on amion.

## 2022-06-03 DIAGNOSIS — F1090 Alcohol use, unspecified, uncomplicated: Secondary | ICD-10-CM | POA: Diagnosis not present

## 2022-06-03 DIAGNOSIS — K852 Alcohol induced acute pancreatitis without necrosis or infection: Secondary | ICD-10-CM | POA: Diagnosis not present

## 2022-06-03 DIAGNOSIS — I10 Essential (primary) hypertension: Secondary | ICD-10-CM | POA: Diagnosis not present

## 2022-06-03 LAB — COMPREHENSIVE METABOLIC PANEL
ALT: 32 U/L (ref 0–44)
AST: 26 U/L (ref 15–41)
Albumin: 2.8 g/dL — ABNORMAL LOW (ref 3.5–5.0)
Alkaline Phosphatase: 88 U/L (ref 38–126)
Anion gap: 12 (ref 5–15)
BUN: 9 mg/dL (ref 6–20)
CO2: 17 mmol/L — ABNORMAL LOW (ref 22–32)
Calcium: 8.6 mg/dL — ABNORMAL LOW (ref 8.9–10.3)
Chloride: 103 mmol/L (ref 98–111)
Creatinine, Ser: 0.92 mg/dL (ref 0.61–1.24)
GFR, Estimated: >60 mL/min (ref >60)
Glucose, Bld: 121 mg/dL — ABNORMAL HIGH (ref 70–99)
Potassium: 3.5 mmol/L (ref 3.5–5.1)
Sodium: 132 mmol/L — ABNORMAL LOW (ref 135–145)
Total Bilirubin: 8 mg/dL — ABNORMAL HIGH (ref 0.3–1.2)
Total Protein: 6.5 g/dL (ref 6.5–8.1)

## 2022-06-03 LAB — GLUCOSE, CAPILLARY
Glucose-Capillary: 118 mg/dL — ABNORMAL HIGH (ref 70–99)
Glucose-Capillary: 121 mg/dL — ABNORMAL HIGH (ref 70–99)
Glucose-Capillary: 116 mg/dL — ABNORMAL HIGH (ref 70–99)
Glucose-Capillary: 101 mg/dL — ABNORMAL HIGH (ref 70–99)

## 2022-06-03 LAB — LIPASE, BLOOD: Lipase: 41 U/L (ref 11–51)

## 2022-06-03 MED ORDER — METOPROLOL TARTRATE 25 MG PO TABS
25.0000 mg | ORAL_TABLET | Freq: Two times a day (BID) | ORAL | Status: DC
  Administered 2022-06-03 – 2022-06-04 (×2): 25 mg via ORAL
  Filled 2022-06-03 (×2): qty 1

## 2022-06-03 MED ORDER — SODIUM CHLORIDE 0.9 % IV SOLN: INTRAVENOUS | Status: AC

## 2022-06-03 NOTE — Progress Notes (Signed)
  Transition of Care [facility]) Screening Note   Patient Details  Name: Eric Hodge Date of Birth: 11-02-1985   [facility] CM/SW Contact:    Darrold Span, RN Phone Number: 06/03/2022, 2:30 PM    Transition of Care Department [facility]) has reviewed patient and no TOC needs have been identified at this time. We will continue to monitor patient advancement through interdisciplinary progression rounds. If new patient transition needs arise, please place a TOC consult.  TOC will follow up with pt for Substance abuse needs prior to discharge

## 2022-06-03 NOTE — Progress Notes (Signed)
[facility]                                                                              Eric Hodge, is a 37 y.o. male, DOB - [birth year], WUJ:[id number] Admit date - 05/31/2022    Outpatient Primary MD for the patient is Tanya Nones, Priscille Heidelberg, MD  LOS - 2  days  Chief Complaint  Patient presents with   Abdominal Pain       Brief summary   Patient is a 37 year old male with alcohol use disorder, prior history of alcoholic pancreatitis, hyperlipidemia, GERD, ADHD, obesity presented with severe epigastric abdominal pain, hypertensive and slightly tachycardic on arrival. Patient states he had pancreatitis in September last year and had stopped drinking alcohol after that, however, about 3 to 4 weeks ago he started drinking heavily again.  He is drinking half a bottle of liquor a day, last drink was at night before the admission.   WBCs 12.9, hemoglobin 16.3, AST 36, ALT 51, alk phos 119, total bilirubin 1.2, lipase 126. CT abdomen pelvis with contrast showed extensive stranding around the pancreatic head, uncinate process and body compatible with acute pancreatitis.  Hepatic steatosis.  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   Assessment & Plan    Principal Problem: Acute alcoholic pancreatitis -Pain improving  - lipase 41, continue pain control, IV fluids, dec to 100 cc/hr -Counseled on alcohol cessation -Triglycerides 121 - start clear liquids today, advance to full liquids in evening   Active Problems:   Alcohol use disorder -Continue Ativan with CIWA protocol -Continue thiamine, folate -CIWA 0  Acute transaminitis Resolved - CT abdomen pelvis showed hepatic steatosis  Elevated BP readings -Likely due to pain, will place on IV hydralazine as needed with parameters -Placed on Lopressor 12.5 mg twice daily  Obesity Estimated body mass index is 36.79 kg/m as calculated from the following:    Height as of this encounter: 5\' 10"  (1.778 m).   Weight as of this encounter: 116.3 kg.  Code Status: Full code DVT Prophylaxis:  enoxaparin (LOVENOX) injection 40 mg Start: 06/01/22 1400   Level of Care: Level of care: Progressive Family Communication: Updated patient Disposition Plan:      Remains inpatient appropriate:      Procedures:  None  Consultants:   None  Antimicrobials:  Medications  enoxaparin (LOVENOX) injection  40 mg Subcutaneous Q24H   folic acid  1 mg Oral Daily   multivitamin with minerals  1 tablet Oral Daily   thiamine  100 mg Oral Daily   Or   thiamine  100 mg Intravenous Daily      Subjective:   Eric Hodge was seen and examined today. Overall improving, pain is better, wants to try clears and advance slowly.    Objective:   Vitals:   06/01/22 2320 06/02/22 0313 06/02/22 0404 06/02/22 1158  BP: (!) 153/99 (!) 158/98 (!) 155/95 (!) 157/98  Pulse: (!) 109 (!) 103  99  Resp: 19 18 18    Temp: 99.8 F (37.7 C) 99.5 F (37.5 C) 99 F (37.2 C) 99.5 F (37.5 C)  TempSrc: Oral Oral Oral Oral  SpO2: 93% 91%  93%  Weight:      Height:       No intake or output data in the 24 hours ending 06/02/22 1418    Wt Readings from Last 3 Encounters:  06/01/22 116.3 kg  04/28/22 108.9 kg  03/17/22 110.7 kg   Physical Exam General: Alert and oriented x 3, NAD Cardiovascular: S1 S2 clear, RRR.  Respiratory: CTAB Gastrointestinal: Soft, mild epigastric TTP, nondistended, NBS Ext: no pedal edema bilaterally Neuro: no new deficits Psych: Normal affect    Data Reviewed:  I have personally reviewed following labs    CBC Lab Results  Component Value Date   WBC 13.2 (H) 06/01/2022   RBC 4.90 06/01/2022   HGB 15.4 06/01/2022   HCT 43.0 06/01/2022   MCV 87.8 06/01/2022   MCH 31.4 06/01/2022   PLT 139 (L) 06/01/2022   MCHC 35.8 06/01/2022   RDW 14.3 06/01/2022   LYMPHSABS 1.3 05/31/2022   MONOABS 0.7 05/31/2022   EOSABS 0.1 05/31/2022    BASOSABS 0.1 05/31/2022     Last metabolic panel Lab Results  Component Value Date   NA 137 06/01/2022   K 3.7 06/01/2022   CL 106 06/01/2022   CO2 20 (L) 06/01/2022   BUN 7 06/01/2022   CREATININE 0.99 06/01/2022   GLUCOSE 138 (H) 06/01/2022   GFRNONAA >60 06/01/2022   GFRAA 110 10/31/2019   CALCIUM 8.7 (L) 06/01/2022   PROT 6.8 06/01/2022   ALBUMIN 3.9 06/01/2022   BILITOT 1.2 06/01/2022   ALKPHOS 114 06/01/2022   AST 27 06/01/2022   ALT 41 06/01/2022   ANIONGAP 11 06/01/2022    CBG (last 3)  Recent Labs    06/01/22 2320 06/02/22 0821 06/02/22 1158  GLUCAP 131* 145* 138*      Coagulation Profile: No results for input(s): "INR", "PROTIME" in the last 168 hours.   Radiology Studies: I have personally reviewed the imaging studies  CT ABDOMEN PELVIS W CONTRAST  Result Date: 05/31/2022 CLINICAL DATA:  Abdominal pain, acute (Ped 0-17y) EXAM: CT ABDOMEN AND PELVIS WITH CONTRAST TECHNIQUE: Multidetector CT imaging of the abdomen and pelvis was performed using the standard protocol following bolus administration of intravenous contrast. RADIATION DOSE REDUCTION: This exam was performed according to the departmental dose-optimization program which includes automated exposure control, adjustment of the mA and/or kV according to patient size and/or use of iterative reconstruction technique. CONTRAST:  75mL OMNIPAQUE IOHEXOL 350 MG/ML SOLN COMPARISON:  04/05/2022 FINDINGS: Lower chest: No acute findings Hepatobiliary: Is Diffuse low-density throughout the liver compatible with fatty infiltration. No focal abnormality. Gallbladder unremarkable. Pancreas: Extensive inflammatory stranding noted around the pancreatic head, uncinate process and body compatible with acute pancreatitis. Spleen: Splenomegaly with craniocaudal length 14.3 cm. 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 Adrenals/Urinary Tract: No adrenal abnormality. No focal renal abnormality. No stones  or hydronephrosis. Urinary bladder is unremarkable. Stomach/Bowel: Stomach, large and small bowel grossly unremarkable. Normal appendix. Vascular/Lymphatic: No evidence of aneurysm or adenopathy. Reproductive: No visible focal abnormality. Other: No free fluid or free air. Musculoskeletal: No acute bony abnormality. IMPRESSION: Extensive stranding around the pancreatic head, uncinate process and body compatible with acute pancreatitis. Hepatic steatosis. 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 Splenomegaly. Electronically Signed   By: Charlett Nose M.D.   On: 05/31/2022 20:43       Bessie Livingood M.D. [facility] 06/02/2022, 2:18 PM  Available via Epic secure chat  7am-7pm After 7 pm, please refer to night coverage provider listed on amion.

## 2022-06-04 DIAGNOSIS — K852 Alcohol induced acute pancreatitis without necrosis or infection: Secondary | ICD-10-CM | POA: Diagnosis not present

## 2022-06-04 DIAGNOSIS — F1090 Alcohol use, unspecified, uncomplicated: Secondary | ICD-10-CM | POA: Diagnosis not present

## 2022-06-04 LAB — PHOSPHORUS: Phosphorus: 2.9 mg/dL (ref 2.5–4.6)

## 2022-06-04 LAB — BASIC METABOLIC PANEL
Anion gap: 10 (ref 5–15)
BUN: 9 mg/dL (ref 6–20)
CO2: 20 mmol/L — ABNORMAL LOW (ref 22–32)
Calcium: 8.5 mg/dL — ABNORMAL LOW (ref 8.9–10.3)
Chloride: 103 mmol/L (ref 98–111)
Creatinine, Ser: 0.94 mg/dL (ref 0.61–1.24)
GFR, Estimated: >60 mL/min (ref >60)
Glucose, Bld: 108 mg/dL — ABNORMAL HIGH (ref 70–99)
Potassium: 3.7 mmol/L (ref 3.5–5.1)
Sodium: 133 mmol/L — ABNORMAL LOW (ref 135–145)

## 2022-06-04 LAB — GLUCOSE, CAPILLARY: Glucose-Capillary: 120 mg/dL — ABNORMAL HIGH (ref 70–99)

## 2022-06-04 LAB — MAGNESIUM: Magnesium: 2 mg/dL (ref 1.7–2.4)

## 2022-06-04 MED ORDER — ATENOLOL 25 MG PO TABS: 25.0000 mg | ORAL_TABLET | Freq: Every day | ORAL | 3 refills | Status: DC

## 2022-06-04 MED ORDER — VITAMIN B-1 100 MG PO TABS: 100.0000 mg | ORAL_TABLET | Freq: Every day | ORAL | 0 refills | Status: DC

## 2022-06-04 MED ORDER — ONDANSETRON HCL 4 MG PO TABS: 4.0000 mg | ORAL_TABLET | Freq: Three times a day (TID) | ORAL | 0 refills | Status: DC | PRN

## 2022-06-04 MED ORDER — FOLIC ACID 1 MG PO TABS: 1.0000 mg | ORAL_TABLET | Freq: Every day | ORAL | 0 refills | Status: DC

## 2022-06-04 MED ORDER — OXYCODONE HCL 5 MG PO TABS: 5.0000 mg | ORAL_TABLET | Freq: Four times a day (QID) | ORAL | 0 refills | Status: DC | PRN

## 2022-06-04 NOTE — Progress Notes (Signed)
Pt discharge education and instructions completed with pt and pt voices understanding, denies any questions. Pt IV and telemetry removed prior to discharge. Pt showered prior to DC. Pt to pick up electronically sent prescriptions from preferred pharmacy on file. Pt offered wheelchair but declines and ambulated off unit with RN to the side. Pt to be picked up by his father to be transported home. Dionne Bucy MSN, RN

## 2022-06-04 NOTE — Discharge Summary (Signed)
Physician Discharge Summary   Patient: Eric Hodge MRN: [id number] DOB: 27-Feb-1985  Admit date:     05/31/2022  Discharge date: 06/04/22  Discharge Physician: Thad Ranger, MD    PCP: Donita Brooks, MD   Recommendations at discharge:   Counseled strongly on alcohol cessation  Discharge Diagnoses:  Acute alcoholic pancreatitis Alcohol use disorder Acute transaminitis Elevated BP readings Obesity   Hospital Course: Patient is a 37 year old male with alcohol use disorder, prior history of alcoholic pancreatitis, hyperlipidemia, GERD, ADHD, obesity presented with severe epigastric abdominal pain, hypertensive and slightly tachycardic on arrival. Patient states he had pancreatitis in September last year and had stopped drinking alcohol after that, however, about 3 to 4 weeks ago he started drinking heavily again.  He is drinking half a bottle of liquor a day, last drink was at night before the admission.   WBCs 12.9, hemoglobin 16.3, AST 36, ALT 51, alk phos 119, total bilirubin 1.2, lipase 126. CT abdomen pelvis with contrast showed extensive stranding around the pancreatic head, uncinate process and body compatible with acute pancreatitis.  Hepatic steatosis.  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 status, IV fluids, pain control, antiemetics -Lipase improved to 41, tolerating low-fat soft diet without any difficulty. -Counseled on alcohol cessation -Triglycerides 121      Alcohol use disorder -Patient was placed on Ativan with CIWA protocol  -Continue thiamine, folate -CIWA 0   Acute transaminitis Resolved - CT abdomen pelvis showed hepatic steatosis   Elevated BP readings -Patient had consistently elevated BP readings with tachycardia, noted in the previous admissions also.   -Started on atenolol 25 mg daily     Obesity Estimated body mass index is 36.79 kg/m as calculated from the following:   Height as of this encounter: 5\' 10"  (1.778 m).   Weight as of this encounter: 116.3 kg.       Pain control - [employer] Controlled Substance Reporting System database was reviewed. and patient was instructed, not to drive, operate heavy machinery, perform activities at heights, swimming or participation in water activities or provide baby-sitting services while on Pain, Sleep and Anxiety Medications; until their outpatient Physician has advised to do so again. Also recommended to not to take more than prescribed Pain, Sleep and Anxiety Medications.  Consultants: None Procedures performed: None Disposition: Home Diet recommendation:  Discharge Diet Orders (From admission, onward)     Start     Ordered   06/04/22 0000  Diet - low sodium heart healthy        06/04/22 0925            DISCHARGE MEDICATION: Allergies as of 06/04/2022   No Known Allergies      Medication List     STOP taking these medications    ibuprofen 200 MG tablet Commonly known as: ADVIL       TAKE these medications    atenolol 25 MG tablet Commonly known as: Tenormin Take 1 tablet (25 mg total) by mouth daily.   folic acid 1 MG tablet Commonly known as: FOLVITE Take 1 tablet (1 mg total) by mouth daily. Start taking on: Jun 05, 2022   montelukast 10 MG tablet Commonly known as: SINGULAIR TAKE 1 TABLET(10 MG) BY MOUTH DAILY AS NEEDED FOR ALLERGIES   multivitamin with minerals Tabs tablet Take 1 tablet by mouth in the morning.   omeprazole 20 MG capsule  Commonly known as: PRILOSEC Take 1 capsule (20 mg total) by mouth daily. What changed:  when to take this reasons to take this   ondansetron 4 MG tablet Commonly known as: Zofran Take 1 tablet (4 mg total) by mouth every 8 (eight) hours as needed for nausea or vomiting.   oxyCODONE 5 MG immediate release tablet Commonly known as: Oxy  IR/ROXICODONE Take 1 tablet (5 mg total) by mouth every 6 (six) hours as needed for moderate pain or severe pain.   rosuvastatin 10 MG tablet Commonly known as: Crestor Take 1 tablet (10 mg total) by mouth daily. What changed: when to take this   testosterone cypionate 200 MG/ML injection Commonly known as: DEPOTESTOSTERONE CYPIONATE Inject 1 mL (200 mg total) into the muscle every 14 (fourteen) days.   thiamine 100 MG tablet Commonly known as: Vitamin B-1 Take 1 tablet (100 mg total) by mouth daily. Start taking on: Jun 05, 2022        Follow-up Information     Donita Brooks, MD. Schedule an appointment as soon as possible for a visit in 2 week(s).   Specialty: Family Medicine Why: for hospital follow-up Contact information: [address] [place] [address] [phone]                Discharge Exam: Ceasar Mons Weights   05/31/22 2123 06/01/22 0253  Weight: 113.4 kg 116.3 kg   S: Feeling much better today, pain control, has been tolerating diet.  Feels okay to go home today.  BP 131/83 (BP Location: Right Arm)   Pulse 85   Temp 98.1 F (36.7 C) (Oral)   Resp 20   Ht 5\' 10"  (1.778 m)   Wt 116.3 kg   SpO2 96%   BMI 36.79 kg/m   Physical Exam General: Alert and oriented x 3, NAD Cardiovascular: S1 S2 clear, RRR.  Respiratory: CTAB, no wheezing, rales or rhonchi Gastrointestinal: Soft, nontender, nondistended, NBS Ext: no pedal edema bilaterally Neuro: no new deficits Skin: No rashes Psych: Normal affect and demeanor, alert and oriented x3    Condition at discharge: fair  The results of significant diagnostics from this hospitalization (including imaging, microbiology, ancillary and laboratory) are listed below for reference.   Imaging Studies: CT ABDOMEN PELVIS W CONTRAST  Result Date: 05/31/2022 CLINICAL DATA:  Abdominal pain, acute (Ped 0-17y) EXAM: CT ABDOMEN AND PELVIS WITH CONTRAST TECHNIQUE: Multidetector CT imaging of the  abdomen and pelvis was performed using the standard protocol following bolus administration of intravenous contrast. RADIATION DOSE REDUCTION: This exam was performed according to the departmental dose-optimization program which includes automated exposure control, adjustment of the mA and/or kV according to patient size and/or use of iterative reconstruction technique. CONTRAST:  75mL OMNIPAQUE IOHEXOL 350 MG/ML SOLN COMPARISON:  04/05/2022 FINDINGS: Lower chest: No acute findings Hepatobiliary: Is Diffuse low-density throughout the liver compatible with fatty infiltration. No focal abnormality. Gallbladder unremarkable. Pancreas: Extensive inflammatory stranding noted around the pancreatic head, uncinate process and body compatible with acute pancreatitis. Spleen: Splenomegaly with craniocaudal length 14.3 cm. 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 Adrenals/Urinary Tract: No adrenal abnormality. No focal renal abnormality. No stones or hydronephrosis. Urinary bladder is unremarkable. Stomach/Bowel: Stomach, large and small bowel grossly unremarkable. Normal appendix. Vascular/Lymphatic: No evidence of aneurysm or adenopathy. Reproductive: No visible focal abnormality. Other: No free fluid or free air. Musculoskeletal: No acute bony abnormality. IMPRESSION: Extensive stranding around the pancreatic head, uncinate process and body compatible with acute pancreatitis.  Hepatic steatosis. 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 Splenomegaly. Electronically Signed   By: Charlett Nose M.D.   On: 05/31/2022 20:43    Microbiology: Results for orders placed or performed during the hospital encounter of 03/17/22  Body fluid culture w Gram Stain     Status: None   Collection Time: 03/17/22 12:04 PM   Specimen: PATH Cytology Misc. fluid; Body Fluid  Result Value Ref Range Status   Specimen Description   Final    FLUID Performed at [facility], [address], [place], [place] [address]    Special Requests PANCREATIC CYST FLUID  Final   Gram Stain   Final    CYTOSPIN SMEAR GRAM POSITIVE COCCI IN CHAINS WBC SEEN    Culture   Final    FEW STREPTOCOCCUS MITIS/ORALIS FEW STREPTOCOCCUS GROUP C Beta hemolytic streptococci are predictably susceptible to penicillin and other beta lactams. Susceptibility testing not routinely performed. RARE ROTHIA SPECIES Standardized susceptibility testing for this organism is not available. Performed at [facility], [address], [place], [place] [address]    Report Status 03/23/2022 FINAL  Final   Organism ID, Bacteria STREPTOCOCCUS MITIS/ORALIS  Final      Susceptibility   Streptococcus mitis/oralis - MIC*    TETRACYCLINE >=16 RESISTANT Resistant     VANCOMYCIN 0.5 SENSITIVE Sensitive     CLINDAMYCIN 0.5 INTERMEDIATE Intermediate     * FEW STREPTOCOCCUS MITIS/ORALIS  Anaerobic culture w Gram Stain     Status: None   Collection Time: 03/18/22 12:04 PM   Specimen: Fluid  Result Value Ref Range Status   Specimen Description FLUID  Final   Special Requests PANCREATIC CYST FLUID  Final   Gram Stain   Final    GRAM POSITIVE COCCI IN CHAINS CYTOSPIN SMEAR WBC SEEN    Culture   Final    NO ANAEROBES ISOLATED Performed at [facility], [address], [place], [place] [address]    Report Status 03/23/2022 FINAL  Final    Labs: CBC: Recent Labs  Lab 05/31/22 1649 05/31/22 1744 06/01/22 0650  WBC  --  12.9* 13.2*  NEUTROABS  --  10.7*  --   HGB 16.3 16.3 15.4  HCT 48.0 46.4 43.0  MCV  --  89.2 87.8  PLT  --  184 139*   Basic Metabolic Panel: Recent Labs  Lab 05/31/22 1649 05/31/22 1744 06/01/22 0650 06/03/22 0928 06/04/22 0409  NA 139 139 137 132* 133*  K 4.3 4.0 3.7 3.5 3.7  CL 107 105 106 103 103  CO2  --  21* 20* 17* 20*  GLUCOSE 125* 123* 138* 121* 108*  BUN 12 11 7 9 9   CREATININE 1.00 1.17 0.99 0.92 0.94  CALCIUM  --  9.6 8.7*  8.6* 8.5*  MG  --   --   --   --  2.0  PHOS  --   --   --   --  2.9   Liver Function Tests: Recent Labs  Lab 05/31/22 1744 06/01/22 0650 06/03/22 0928  AST 36 27 26  ALT 51* 41 32  ALKPHOS 119 114 88  BILITOT 1.2 1.2 8.0*  PROT 7.6 6.8 6.5  ALBUMIN 4.7 3.9 2.8*   CBG: Recent Labs  Lab 06/03/22 0458 06/03/22 1110 06/03/22 1738 06/03/22 2317 06/04/22 0608  GLUCAP 118* 121* 116* 101* 120*    Discharge time spent: greater than 30 minutes.  Signed: Guilianna Mckoy  Isidoro Donning, MD [facility] 06/04/2022

## 2022-06-06 ENCOUNTER — Telehealth: Payer: Self-pay

## 2022-06-06 NOTE — Transitions of Care (Post Inpatient/ED Visit) (Signed)
06/06/2022  Name: Eric Hodge MRN: [id number] DOB: [birth year]  Today's TOC FU Call Status: Today's TOC FU Call Status:: Successful TOC FU Call Competed TOC FU Call Complete Date: 06/06/22  Transition Care Management Follow-up Telephone Call Date of Discharge: 06/04/22 Discharge Facility: [facility]) Type of Discharge: Inpatient Admission Primary Inpatient Discharge Diagnosis:: alcohol induced pancreatitis How have you been since you were released from the hospital?: Better Any questions or concerns?: No  Items Reviewed: Did you receive and understand the discharge instructions provided?: Yes Medications obtained,verified, and reconciled?: Yes (Medications Reviewed) Any new allergies since your discharge?: No Dietary orders reviewed?: Yes Do you have support at home?: No  Medications Reviewed Today: Medications Reviewed Today     Reviewed by Karena Addison, LPN (Licensed Practical Nurse) on 06/06/22 at [id number]  Med List Status: <None>   Medication Order Taking? Sig Documenting Provider Last Dose Status Informant  atenolol (TENORMIN) 25 MG tablet [id number] Yes Take 1 tablet (25 mg total) by mouth daily. Rai, Delene Ruffini, MD Taking Active   folic acid (FOLVITE) 1 MG tablet [id number] Yes Take 1 tablet (1 mg total) by mouth daily. Rai, Delene Ruffini, MD Taking Active   montelukast (SINGULAIR) 10 MG tablet [id number] No TAKE 1 TABLET(10 MG) BY MOUTH DAILY AS NEEDED FOR ALLERGIES  Patient not taking: Reported on 01/19/2022   Donita Brooks, MD Not Taking Active Self  Multiple Vitamin (MULTIVITAMIN WITH MINERALS) TABS tablet [id number] Yes Take 1 tablet by mouth in the morning. [provider] Taking Active Self  omeprazole (PRILOSEC) 20 MG capsule [id number] Yes Take 1 capsule (20 mg total) by mouth daily.  Patient taking differently: Take 20 mg by mouth daily as needed (heart burn).   Mansouraty, Netty Starring., MD Taking Active Self  ondansetron [facility]) 4 MG tablet  [id number] Yes Take 1 tablet (4 mg total) by mouth every 8 (eight) hours as needed for nausea or vomiting. Cathren Harsh, MD Taking Active   oxyCODONE (OXY IR/ROXICODONE) 5 MG immediate release tablet [id number] Yes Take 1 tablet (5 mg total) by mouth every 6 (six) hours as needed for moderate pain or severe pain. Rai, Delene Ruffini, MD Taking Active   rosuvastatin (CRESTOR) 10 MG tablet [id number] Yes Take 1 tablet (10 mg total) by mouth daily.  Patient taking differently: Take 10 mg by mouth at bedtime.   Donita Brooks, MD Taking Active Self  testosterone cypionate (DEPOTESTOSTERONE CYPIONATE) 200 MG/ML injection [id number] Yes Inject 1 mL (200 mg total) into the muscle every 14 (fourteen) days. Donita Brooks, MD Taking Active Self  thiamine (VITAMIN B-1) 100 MG tablet [id number] Yes Take 1 tablet (100 mg total) by mouth daily. Cathren Harsh, MD Taking Active             Home Care and Equipment/Supplies: Were Home Health Services Ordered?: NA Any new equipment or medical supplies ordered?: NA  Functional Questionnaire: Do you need assistance with bathing/showering or dressing?: No Do you need assistance with meal preparation?: No Do you need assistance with eating?: No Do you have difficulty maintaining continence: No Do you need assistance with getting out of bed/getting out of a chair/moving?: No Do you have difficulty managing or taking your medications?: No  Follow up appointments reviewed: PCP Follow-up appointment confirmed?: Yes Date of PCP follow-up appointment?: 06/14/22 Follow-up Provider: [facility] Follow-up appointment confirmed?: NA Do you need transportation to your follow-up appointment?: No Do you understand care options if your condition(s) worsen?: Yes-patient  verbalized understanding    SIGNATURE Karena Addison, LPN [facility] Nurse Health Advisor Direct Dial [phone]

## 2022-06-09 ENCOUNTER — Telehealth: Payer: Self-pay

## 2022-06-09 NOTE — Telephone Encounter (Signed)
Pt called in to ask for a courtesy refill of this med oxyCODONE (OXY IR/ROXICODONE) 5 MG immediate release tablet [[id number]]. Pt has an OV scheduled with pcp on 06/14/22 and will be out of this med before then. Please advise  Cb#: [phone]  LOV: 12/13/21 UPCOMING 06/14/22 HFU  PHARMACY: [id number] [place], [place] - [address] AT [facility] [address] & [facility] [address] [place] [address] Phone: [phone]  Fax: [phone]

## 2022-06-10 ENCOUNTER — Other Ambulatory Visit: Payer: Self-pay | Admitting: Family Medicine

## 2022-06-10 MED ORDER — OXYCODONE HCL 5 MG PO TABS: 5.0000 mg | ORAL_TABLET | Freq: Four times a day (QID) | ORAL | 0 refills | Status: DC | PRN

## 2022-06-14 ENCOUNTER — Encounter: Payer: Self-pay | Admitting: Family Medicine

## 2022-06-14 ENCOUNTER — Ambulatory Visit (INDEPENDENT_AMBULATORY_CARE_PROVIDER_SITE_OTHER): Payer: 59 | Admitting: Family Medicine

## 2022-06-14 VITALS — BP 124/72 | HR 97 | Temp 98.5°F | Ht 70.0 in | Wt 243.8 lb

## 2022-06-14 DIAGNOSIS — M2390 Unspecified internal derangement of unspecified knee: Secondary | ICD-10-CM | POA: Insufficient documentation

## 2022-06-14 DIAGNOSIS — R809 Proteinuria, unspecified: Secondary | ICD-10-CM | POA: Diagnosis not present

## 2022-06-14 DIAGNOSIS — K861 Other chronic pancreatitis: Secondary | ICD-10-CM | POA: Diagnosis not present

## 2022-06-14 DIAGNOSIS — R7989 Other specified abnormal findings of blood chemistry: Secondary | ICD-10-CM | POA: Insufficient documentation

## 2022-06-14 DIAGNOSIS — M25519 Pain in unspecified shoulder: Secondary | ICD-10-CM | POA: Insufficient documentation

## 2022-06-14 DIAGNOSIS — K860 Alcohol-induced chronic pancreatitis: Secondary | ICD-10-CM

## 2022-06-14 DIAGNOSIS — Z7729 Contact with and (suspected ) exposure to other hazardous substances: Secondary | ICD-10-CM | POA: Insufficient documentation

## 2022-06-14 DIAGNOSIS — S43439A Superior glenoid labrum lesion of unspecified shoulder, initial encounter: Secondary | ICD-10-CM | POA: Insufficient documentation

## 2022-06-14 DIAGNOSIS — M25569 Pain in unspecified knee: Secondary | ICD-10-CM | POA: Insufficient documentation

## 2022-06-14 LAB — CBC WITH DIFFERENTIAL/PLATELET
Eosinophils Absolute: 172 cells/uL (ref 15–500)
Monocytes Relative: 4.7 %
Neutro Abs: 6068 cells/uL (ref 1500–7800)
Neutrophils Relative %: 77.8 %
RBC: 4.85 10*6/uL (ref 4.20–5.80)
RDW: 14.1 % (ref 11.0–15.0)

## 2022-06-14 MED ORDER — OXYCODONE HCL 5 MG PO TABS: 5.0000 mg | ORAL_TABLET | Freq: Four times a day (QID) | ORAL | 0 refills | Status: DC | PRN

## 2022-06-14 NOTE — Progress Notes (Signed)
Subjective:    Patient ID: Eric Hodge, male    DOB: 1985/04/11, 37 y.o.   MRN: [id number]  HPI  Charbel Mccleaf [id number] DOB: [birth year] DOA: 09/19/2021 I last saw the patient in November.  Was recently admitted to the hospital in May: Admit date:     05/31/2022  Discharge date: 06/04/22  Discharge Physician: Thad Ranger, MD     PCP: Donita Brooks, MD    Recommendations at discharge:    Counseled strongly on alcohol cessation   Discharge Diagnoses:   Acute alcoholic pancreatitis Alcohol use disorder Acute transaminitis Elevated BP readings Obesity     Hospital Course: Patient is a 37 year old male with alcohol use disorder, prior history of alcoholic pancreatitis, hyperlipidemia, GERD, ADHD, obesity presented with severe epigastric abdominal pain, hypertensive and slightly tachycardic on arrival. Patient states he had pancreatitis in September last year and had stopped drinking alcohol after that, however, about 3 to 4 weeks ago he started drinking heavily again.  He is drinking half a bottle of liquor a day, last drink was at night before the admission.   WBCs 12.9, hemoglobin 16.3, AST 36, ALT 51, alk phos 119, total bilirubin 1.2, lipase 126. CT abdomen pelvis with contrast showed extensive stranding around the pancreatic head, uncinate process and body compatible with acute pancreatitis.  Hepatic steatosis.  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 status, IV fluids, pain control, antiemetics -Lipase improved to 41, tolerating low-fat soft diet without any difficulty. -Counseled on alcohol cessation -Triglycerides 121       Alcohol use disorder -Patient was placed on Ativan with CIWA protocol  -Continue thiamine, folate -CIWA 0   Acute transaminitis Resolved - CT abdomen pelvis showed hepatic steatosis   Elevated BP  readings -Patient had consistently elevated BP readings with tachycardia, noted in the previous admissions also.   -Started on atenolol 25 mg daily     Obesity Estimated body mass index is 36.79 kg/m as calculated from the following:   Height as of this encounter: 5\' 10"  (1.778 m).   Weight as of this encounter: 116.3 kg.  Of note, chronic splenic vein thrombosis is listed on the CT.  This was first mentioned on CT from 3/24.  We discussed the splenic vein thrombosis today.  Patient was unaware.  However CT scan showed good collateral circulation therefore I do not believe that the benefit outweighs the risk of anticoagulation.  I explained this to the patient and he understands.  I did decide to stop the testosterone as I do not want any medication that will increase the risk of thrombosis.  Patient is in agreement with this.  He is primarily eating a clear liquid diet.  He has been out of the pain medication since last week.  He states that he is having abdominal pain even when he tries to drink.  He states that his urine has a dark color.  His urinalysis showed significant proteinuria greater than 300 as well as ketones however he was extremely dehydrated when that sample was obtained. Past Medical History:  Diagnosis Date   ADHD (attention deficit hyperactivity disorder)    Alcohol use disorder 09/2021   Elevated LFTs    Hyperlipidemia    Pancreatitis, alcoholic, acute 09/2021   Retained orthopedic hardware 02/2017   right distal radius   Past Surgical History:  Procedure Laterality Date   BALLOON DILATION  N/A 03/17/2022   Procedure: BALLOON DILATION;  Surgeon: Meridee Score Netty Starring., MD;  Location: [facility];  Service: Gastroenterology;  Laterality: N/A;   BIOPSY  02/17/2022   Procedure: BIOPSY;  Surgeon: Meridee Score Netty Starring., MD;  Location: [facility];  Service: Gastroenterology;;   Fidela Salisbury RELEASE Right 10/06/2016   Procedure: CARPAL TUNNEL RELEASE;  Surgeon: Betha Loa, MD;  Location: [facility];  Service: Orthopedics;  Laterality: Right;   CYST GASTROSTOMY  03/17/2022   Procedure: CYST GASTROSTOMY;  Surgeon: Meridee Score Netty Starring., MD;  Location: [facility];  Service: Gastroenterology;;   ESOPHAGOGASTRODUODENOSCOPY (EGD) WITH PROPOFOL N/A 02/17/2022   Procedure: ESOPHAGOGASTRODUODENOSCOPY (EGD) WITH PROPOFOL;  Surgeon: Lemar Lofty., MD;  Location: [facility];  Service: Gastroenterology;  Laterality: N/A;   ESOPHAGOGASTRODUODENOSCOPY (EGD) WITH PROPOFOL N/A 03/17/2022   Procedure: ESOPHAGOGASTRODUODENOSCOPY (EGD) WITH PROPOFOL;  Surgeon: Meridee Score Netty Starring., MD;  Location: [facility];  Service: Gastroenterology;  Laterality: N/A;   ESOPHAGOGASTRODUODENOSCOPY (EGD) WITH PROPOFOL N/A 04/28/2022   Procedure: ESOPHAGOGASTRODUODENOSCOPY (EGD) WITH PROPOFOL;  Surgeon: Meridee Score Netty Starring., MD;  Location: [facility];  Service: Gastroenterology;  Laterality: N/A;   EUS N/A 02/17/2022   Procedure: UPPER ENDOSCOPIC ULTRASOUND (EUS) RADIAL;  Surgeon: Lemar Lofty., MD;  Location: [facility];  Service: Gastroenterology;  Laterality: N/A;   EUS N/A 03/17/2022   Procedure: UPPER ENDOSCOPIC ULTRASOUND (EUS) RADIAL;  Surgeon: Lemar Lofty., MD;  Location: [facility];  Service: Gastroenterology;  Laterality: N/A;   FOREIGN BODY REMOVAL  09/08/2011   Procedure: REMOVAL FOREIGN BODY EXTREMITY;  Surgeon: Tami Ribas, MD;  Location: [facility];  Service: Orthopedics;  Laterality: Left;  FOREIGN BODY REMOVAL LEFT LONG FINGER   HARDWARE REMOVAL Right 03/10/2017   Procedure: HARDWARE REMOVAL RIGHT WRIST;  Surgeon: Betha Loa, MD;  Location: [facility];  Service: Orthopedics;  Laterality: Right;   MASS EXCISION Left 10/29/2015   Procedure: LEFT WRIST EXCISION MASS;  Surgeon: Betha Loa, MD;  Location: [facility];  Service: Orthopedics;  Laterality: Left;  LEFT WRIST EXCISION  MASS   OPEN REDUCTION INTERNAL FIXATION (ORIF) DISTAL RADIAL FRACTURE Right 10/06/2016   Procedure: OPEN REDUCTION INTERNAL FIXATION (ORIF) RIGHT DISTAL RADIUS;  Surgeon: Betha Loa, MD;  Location: [facility];  Service: Orthopedics;  Laterality: Right;   PANCREATIC STENT PLACEMENT  03/17/2022   Procedure: PANCREATIC STENT PLACEMENT;  Surgeon: Lemar Lofty., MD;  Location: [facility];  Service: Gastroenterology;;   Francine Graven REMOVAL  04/28/2022   Procedure: STENT REMOVAL;  Surgeon: Lemar Lofty., MD;  Location: [facility];  Service: Gastroenterology;;   WISDOM TOOTH EXTRACTION  2012   Current Outpatient Medications on File Prior to Visit  Medication Sig Dispense Refill   atenolol (TENORMIN) 25 MG tablet Take 1 tablet (25 mg total) by mouth daily. 30 tablet 3   folic acid (FOLVITE) 1 MG tablet Take 1 tablet (1 mg total) by mouth daily. 30 tablet 0   montelukast (SINGULAIR) 10 MG tablet TAKE 1 TABLET(10 MG) BY MOUTH DAILY AS NEEDED FOR ALLERGIES 30 tablet 2   Multiple Vitamin (MULTIVITAMIN WITH MINERALS) TABS tablet Take 1 tablet by mouth in the morning.     omeprazole (PRILOSEC) 20 MG capsule Take 1 capsule (20 mg total) by mouth daily. (Patient taking differently: Take 20 mg by mouth daily as needed (heart burn).) 30 capsule 11   ondansetron (ZOFRAN) 4 MG tablet Take 1 tablet (4 mg total) by mouth every 8 (eight) hours as needed  for nausea or vomiting. 30 tablet 0   oxyCODONE (OXY IR/ROXICODONE) 5 MG immediate release tablet Take 1 tablet (5 mg total) by mouth every 6 (six) hours as needed for moderate pain or severe pain. 20 tablet 0   rosuvastatin (CRESTOR) 10 MG tablet Take 1 tablet (10 mg total) by mouth daily. (Patient taking differently: Take 10 mg by mouth at bedtime.) 90 tablet 3   testosterone cypionate (DEPOTESTOSTERONE CYPIONATE) 200 MG/ML injection Inject 1 mL (200 mg total) into the muscle every 14 (fourteen) days. 6 mL 0   thiamine (VITAMIN B-1) 100  MG tablet Take 1 tablet (100 mg total) by mouth daily. (Patient not taking: Reported on 06/14/2022) 30 tablet 0   No current facility-administered medications on file prior to visit.   No Known Allergies Social History   Socioeconomic History   Marital status: Divorced    Spouse name: Not on file   Number of children: Not on file   Years of education: Not on file   Highest education level: Not on file  Occupational History   Not on file  Tobacco Use   Smoking status: Every Day    Packs/day: 0.50    Years: 10.00    Additional pack years: 0.00    Total pack years: 5.00    Types: Cigarettes   Smokeless tobacco: Never  Vaping Use   Vaping Use: Never used  Substance and Sexual Activity   Alcohol use: Yes    Alcohol/week: 6.0 standard drinks of alcohol    Types: 6 Shots of liquor per week    Comment: 4-5 days per week   Drug use: No   Sexual activity: Not Currently  Other Topics Concern   Not on file  Social History Narrative   Not on file   Social Determinants of Health   Financial Resource Strain: Not on file  Food Insecurity: Not on file  Transportation Needs: Not on file  Physical Activity: Not on file  Stress: Not on file  Social Connections: Not on file  Intimate Partner Violence: Not on file    Review of Systems     Objective:   Physical Exam Vitals reviewed.  Constitutional:      Appearance: Normal appearance. He is obese.  Cardiovascular:     Rate and Rhythm: Normal rate and regular rhythm.     Heart sounds: Normal heart sounds. No murmur heard.    No friction rub. No gallop.  Pulmonary:     Effort: Pulmonary effort is normal. No respiratory distress.     Breath sounds: Normal breath sounds. No wheezing, rhonchi or rales.  Chest:     Chest wall: No tenderness.  Abdominal:     General: Bowel sounds are normal. There is no distension.     Palpations: Abdomen is soft.     Tenderness: There is no abdominal tenderness. There is no guarding or rebound.   Musculoskeletal:     Right lower leg: No edema.     Left lower leg: No edema.  Neurological:     Mental Status: He is alert.           Assessment & Plan:  Chronic pancreatitis, unspecified pancreatitis type (HCC) - Plan: CBC with Differential/Platelet, COMPLETE METABOLIC PANEL WITH GFR  Proteinuria, unspecified type - Plan: Urinalysis, Routine w reflex microscopic, Protein / Creatinine Ratio, Urine We had a long discussion regarding pancreatitis.  Patient knows that he has to be abstinent from alcohol completely.  He has tried and failed  on several occasions.  At this point I respectfully told the patient that he needs outside assistance such as Alcoholics Anonymous.  However he continues to want to arrange this himself and declines to allow me to make a referral to an outpatient treatment program.  Slowly advance diet as tolerated based on pain control.  I did refill his oxycodone today.  In the past to use pancreatic enzyme supplementation however he states that he does not get nauseated or have diarrhea or bloating when he eats.  He was doing quite well up until 1 month ago when he started drinking again.  Therefore we will not refill pancreatic enzymes at the present time but he will slowly advance his diet.  I would like to follow-up his urinalysis and check a urine protein to creatinine ratio.  Hopefully this was an isolated incident based on dehydration

## 2022-06-15 ENCOUNTER — Other Ambulatory Visit: Payer: 59

## 2022-06-15 LAB — CBC WITH DIFFERENTIAL/PLATELET
Absolute Monocytes: 367 cells/uL (ref 200–950)
Basophils Relative: 0.8 %
Eosinophils Relative: 2.2 %
Lymphs Abs: 1131 cells/uL (ref 850–3900)
MCH: 31.5 pg (ref 27.0–33.0)
WBC: 7.8 10*3/uL (ref 3.8–10.8)

## 2022-06-15 LAB — COMPLETE METABOLIC PANEL WITH GFR
AG Ratio: 1.7 (calc) (ref 1.0–2.5)
Albumin: 4.5 g/dL (ref 3.6–5.1)
Alkaline phosphatase (APISO): 530 U/L — ABNORMAL HIGH (ref 36–130)
Sodium: 140 mmol/L (ref 135–146)
Total Protein: 7.1 g/dL (ref 6.1–8.1)

## 2022-06-15 LAB — TEST AUTHORIZATION

## 2022-06-16 LAB — CBC WITH DIFFERENTIAL/PLATELET
Basophils Absolute: 62 cells/uL (ref 0–200)
HCT: 45.1 % (ref 38.5–50.0)
Hemoglobin: 15.3 g/dL (ref 13.2–17.1)
MCHC: 33.9 g/dL (ref 32.0–36.0)
MCV: 93 fL (ref 80.0–100.0)
MPV: 11 fL (ref 7.5–12.5)
Platelets: 274 10*3/uL (ref 140–400)
Total Lymphocyte: 14.5 %

## 2022-06-16 LAB — COMPLETE METABOLIC PANEL WITH GFR
ALT: 400 U/L — ABNORMAL HIGH (ref 9–46)
AST: 133 U/L — ABNORMAL HIGH (ref 10–40)
BUN: 11 mg/dL (ref 7–25)
CO2: 20 mmol/L (ref 20–32)
Calcium: 9.7 mg/dL (ref 8.6–10.3)
Chloride: 105 mmol/L (ref 98–110)
Creat: 1.02 mg/dL (ref 0.60–1.26)
Globulin: 2.6 g/dL (calc) (ref 1.9–3.7)
Glucose, Bld: 183 mg/dL — ABNORMAL HIGH (ref 65–99)
Potassium: 4.4 mmol/L (ref 3.5–5.3)
Total Bilirubin: 6.5 mg/dL — ABNORMAL HIGH (ref 0.2–1.2)
eGFR: 99 mL/min/{1.73_m2} (ref >=60)

## 2022-06-16 LAB — PROTEIN / CREATININE RATIO, URINE
Creatinine, Urine: 199 mg/dL (ref 20–320)
Protein/Creat Ratio: 342 mg/g creat — ABNORMAL HIGH (ref 25–148)
Protein/Creatinine Ratio: 0.342 mg/mg creat — ABNORMAL HIGH (ref 0.025–0.148)
Total Protein, Urine: 68 mg/dL — ABNORMAL HIGH (ref 5–25)

## 2022-06-16 LAB — TEST AUTHORIZATION

## 2022-06-16 LAB — URINALYSIS, ROUTINE W REFLEX MICROSCOPIC
Bacteria, UA: NONE SEEN /HPF
Glucose, UA: NEGATIVE
Hgb urine dipstick: NEGATIVE
Hyaline Cast: NONE SEEN /LPF
Ketones, ur: NEGATIVE
Nitrite: NEGATIVE
RBC / HPF: NONE SEEN /HPF (ref 0–2)
Specific Gravity, Urine: 1.015 (ref 1.001–1.035)
WBC, UA: NONE SEEN /HPF (ref 0–5)
pH: 5.5 (ref 5.0–8.0)

## 2022-06-16 LAB — HEMOGLOBIN A1C W/OUT EAG: Hgb A1c MFr Bld: 5.9 % of total Hgb — ABNORMAL HIGH (ref <5.7)

## 2022-06-16 LAB — MICROSCOPIC MESSAGE

## 2022-06-22 ENCOUNTER — Other Ambulatory Visit: Payer: Self-pay | Admitting: Family Medicine

## 2022-10-21 ENCOUNTER — Encounter: Payer: Self-pay | Admitting: Family Medicine

## 2022-10-21 ENCOUNTER — Ambulatory Visit (INDEPENDENT_AMBULATORY_CARE_PROVIDER_SITE_OTHER): Payer: 59 | Admitting: Family Medicine

## 2022-10-21 VITALS — BP 120/62 | HR 74 | Temp 97.5°F | Resp 16 | Wt 257.4 lb

## 2022-10-21 DIAGNOSIS — R7303 Prediabetes: Secondary | ICD-10-CM | POA: Diagnosis not present

## 2022-10-21 DIAGNOSIS — F988 Other specified behavioral and emotional disorders with onset usually occurring in childhood and adolescence: Secondary | ICD-10-CM

## 2022-10-21 DIAGNOSIS — Z8719 Personal history of other diseases of the digestive system: Secondary | ICD-10-CM | POA: Diagnosis not present

## 2022-10-21 NOTE — Progress Notes (Signed)
Subjective:    Patient ID: Eric Hodge, male    DOB: 1985-04-29, 37 y.o.   MRN: [id number]  HPI  Eric Hodge [id number]:[id number] DOB: 12-Jan-1986 DOA: 09/19/2021 I last saw the patient in November.  Was recently admitted to the hospital in May: Admit date:     05/31/2022  Discharge date: 06/04/22  Discharge Physician: Thad Ranger, MD     PCP: Donita Brooks, MD    Recommendations at discharge:    Counseled strongly on alcohol cessation   Discharge Diagnoses:   Acute alcoholic pancreatitis Alcohol use disorder Acute transaminitis Elevated BP readings Obesity     Hospital Course: Patient is a 37 year old male with alcohol use disorder, prior history of alcoholic pancreatitis, hyperlipidemia, GERD, ADHD, obesity presented with severe epigastric abdominal pain, hypertensive and slightly tachycardic on arrival. Patient states he had pancreatitis in September last year and had stopped drinking alcohol after that, however, about 3 to 4 weeks ago he started drinking heavily again.  He is drinking half a bottle of liquor a day, last drink was at night before the admission.   WBCs 12.9, hemoglobin 16.3, AST 36, ALT 51, alk phos 119, total bilirubin 1.2, lipase 126. CT abdomen pelvis with contrast showed extensive stranding around the pancreatic head, uncinate process and body compatible with acute pancreatitis.  Hepatic steatosis.  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 status, IV fluids, pain control, antiemetics -Lipase improved to 41, tolerating low-fat soft diet without any difficulty. -Counseled on alcohol cessation -Triglycerides 121       Alcohol use disorder -Patient was placed on Ativan with CIWA protocol  -Continue thiamine, folate -CIWA 0   Acute transaminitis Resolved - CT abdomen pelvis showed hepatic steatosis   Elevated BP  readings -Patient had consistently elevated BP readings with tachycardia, noted in the previous admissions also.   -Started on atenolol 25 mg daily     Obesity Estimated body mass index is 36.79 kg/m as calculated from the following:   Height as of this encounter: 5\' 10"  (1.778 m).   Weight as of this encounter: 116.3 kg.  Of note, chronic splenic vein thrombosis is listed on the CT.  This was first mentioned on CT from 3/24.  We discussed the splenic vein thrombosis today.  Patient was unaware.  However CT scan showed good collateral circulation therefore I do not believe that the benefit outweighs the risk of anticoagulation.  I explained this to the patient and he understands.  I did decide to stop the testosterone as I do not want any medication that will increase the risk of thrombosis.  Patient is in agreement with this.  He is primarily eating a clear liquid diet.  He has been out of the pain medication since last week.  He states that he is having abdominal pain even when he tries to drink.  He states that his urine has a dark color.  His urinalysis showed significant proteinuria greater than 300 as well as ketones however he was extremely dehydrated when that sample was obtained.  At that time, my plan was: We had a long discussion regarding pancreatitis.  Patient knows that he has to be abstinent from alcohol completely.  He has tried and failed on several occasions.  At this point I respectfully told the patient that he needs outside assistance such as Alcoholics Anonymous.  However he continues to want  to arrange this himself and declines to allow me to make a referral to an outpatient treatment program.  Slowly advance diet as tolerated based on pain control.  I did refill his oxycodone today.  In the past, I did use pancreatic enzyme supplementation however he states that he does not get nauseated or have diarrhea or bloating when he eats.  He was doing quite well up until 1 month ago when  he started drinking again.  Therefore we will not refill pancreatic enzymes at the present time but he will slowly advance his diet.  I would like to follow-up his urinalysis and check a urine protein to creatinine ratio.  Hopefully this was an isolated incident based on dehydration  10/21/22 In the past I was concerned about chronic pancreatitis.  He had had a recurrent episdode of acute pancreatitis and was experiencing pain and nausea afterwards when he would eat.  I empirically tried him on Creon to see if that would help his symptoms.  He took one box (less than 1 month).  Saw no benefit.  Symptoms gradually improved with time as acute pancreatitis resolved.  Denies any abdominal pain, bloating, diarrhea, or nausea with food.  Does have mildly elevated sugars at last visit.  Due to recheck this. Would like to resume vyvanse for add.  States that his mind is "all over the place at work."  Has trouble concentrating.  Has trouble focusing.  Easily distracted.  Has a difficult time finishing his work due to his distractibility.   Past Medical History:  Diagnosis Date   ADHD (attention deficit hyperactivity disorder)    Alcohol use disorder 09/2021   Elevated LFTs    Hyperlipidemia    Pancreatitis, alcoholic, acute 09/2021   Retained orthopedic hardware 02/2017   right distal radius   Past Surgical History:  Procedure Laterality Date   BALLOON DILATION N/A 03/17/2022   Procedure: BALLOON DILATION;  Surgeon: Meridee Score Netty Starring., MD;  Location: [facility];  Service: Gastroenterology;  Laterality: N/A;   BIOPSY  02/17/2022   Procedure: BIOPSY;  Surgeon: Meridee Score Netty Starring., MD;  Location: [facility];  Service: Gastroenterology;;   Fidela Salisbury RELEASE Right 10/06/2016   Procedure: CARPAL TUNNEL RELEASE;  Surgeon: Betha Loa, MD;  Location: [facility];  Service: Orthopedics;  Laterality: Right;   CYST GASTROSTOMY  03/17/2022   Procedure: CYST GASTROSTOMY;  Surgeon:  Meridee Score Netty Starring., MD;  Location: [facility];  Service: Gastroenterology;;   ESOPHAGOGASTRODUODENOSCOPY (EGD) WITH PROPOFOL N/A 02/17/2022   Procedure: ESOPHAGOGASTRODUODENOSCOPY (EGD) WITH PROPOFOL;  Surgeon: Lemar Lofty., MD;  Location: [facility];  Service: Gastroenterology;  Laterality: N/A;   ESOPHAGOGASTRODUODENOSCOPY (EGD) WITH PROPOFOL N/A 03/17/2022   Procedure: ESOPHAGOGASTRODUODENOSCOPY (EGD) WITH PROPOFOL;  Surgeon: Meridee Score Netty Starring., MD;  Location: [facility];  Service: Gastroenterology;  Laterality: N/A;   ESOPHAGOGASTRODUODENOSCOPY (EGD) WITH PROPOFOL N/A 04/28/2022   Procedure: ESOPHAGOGASTRODUODENOSCOPY (EGD) WITH PROPOFOL;  Surgeon: Meridee Score Netty Starring., MD;  Location: [facility];  Service: Gastroenterology;  Laterality: N/A;   EUS N/A 02/17/2022   Procedure: UPPER ENDOSCOPIC ULTRASOUND (EUS) RADIAL;  Surgeon: Lemar Lofty., MD;  Location: [facility];  Service: Gastroenterology;  Laterality: N/A;   EUS N/A 03/17/2022   Procedure: UPPER ENDOSCOPIC ULTRASOUND (EUS) RADIAL;  Surgeon: Lemar Lofty., MD;  Location: [facility];  Service: Gastroenterology;  Laterality: N/A;   FOREIGN BODY REMOVAL  09/08/2011   Procedure: REMOVAL FOREIGN BODY EXTREMITY;  Surgeon: Tami Ribas, MD;  Location: [facility];  Service: Orthopedics;  Laterality: Left;  FOREIGN BODY REMOVAL LEFT LONG FINGER   HARDWARE REMOVAL Right 03/10/2017   Procedure: HARDWARE REMOVAL RIGHT WRIST;  Surgeon: Betha Loa, MD;  Location: [facility];  Service: Orthopedics;  Laterality: Right;   MASS EXCISION Left 10/29/2015   Procedure: LEFT WRIST EXCISION MASS;  Surgeon: Betha Loa, MD;  Location: [facility];  Service: Orthopedics;  Laterality: Left;  LEFT WRIST EXCISION MASS   OPEN REDUCTION INTERNAL FIXATION (ORIF) DISTAL RADIAL FRACTURE Right 10/06/2016   Procedure: OPEN REDUCTION INTERNAL FIXATION (ORIF) RIGHT DISTAL RADIUS;   Surgeon: Betha Loa, MD;  Location: [facility];  Service: Orthopedics;  Laterality: Right;   PANCREATIC STENT PLACEMENT  03/17/2022   Procedure: PANCREATIC STENT PLACEMENT;  Surgeon: Lemar Lofty., MD;  Location: [facility];  Service: Gastroenterology;;   Francine Graven REMOVAL  04/28/2022   Procedure: STENT REMOVAL;  Surgeon: Lemar Lofty., MD;  Location: [facility];  Service: Gastroenterology;;   WISDOM TOOTH EXTRACTION  2012   Current Outpatient Medications on File Prior to Visit  Medication Sig Dispense Refill   atenolol (TENORMIN) 25 MG tablet Take 1 tablet (25 mg total) by mouth daily. 30 tablet 3   folic acid (FOLVITE) 1 MG tablet Take 1 tablet (1 mg total) by mouth daily. 30 tablet 0   montelukast (SINGULAIR) 10 MG tablet TAKE 1 TABLET(10 MG) BY MOUTH DAILY AS NEEDED FOR ALLERGIES 30 tablet 2   Multiple Vitamin (MULTIVITAMIN WITH MINERALS) TABS tablet Take 1 tablet by mouth in the morning.     omeprazole (PRILOSEC) 20 MG capsule Take 1 capsule (20 mg total) by mouth daily. (Patient taking differently: Take 20 mg by mouth daily as needed (heart burn).) 30 capsule 11   ondansetron (ZOFRAN) 4 MG tablet Take 1 tablet (4 mg total) by mouth every 8 (eight) hours as needed for nausea or vomiting. 30 tablet 0   oxyCODONE (OXY IR/ROXICODONE) 5 MG immediate release tablet Take 1 tablet (5 mg total) by mouth every 6 (six) hours as needed for moderate pain or severe pain. 30 tablet 0   rosuvastatin (CRESTOR) 10 MG tablet Take 1 tablet (10 mg total) by mouth daily. (Patient taking differently: Take 10 mg by mouth at bedtime.) 90 tablet 3   testosterone cypionate (DEPOTESTOSTERONE CYPIONATE) 200 MG/ML injection Inject 1 mL (200 mg total) into the muscle every 14 (fourteen) days. 6 mL 0   thiamine (VITAMIN B-1) 100 MG tablet Take 1 tablet (100 mg total) by mouth daily. (Patient not taking: Reported on 06/14/2022) 30 tablet 0   No current facility-administered medications on  file prior to visit.   No Known Allergies Social History   Socioeconomic History   Marital status: Divorced    Spouse name: Not on file   Number of children: Not on file   Years of education: Not on file   Highest education level: Not on file  Occupational History   Not on file  Tobacco Use   Smoking status: Every Day    Current packs/day: 0.50    Average packs/day: 0.5 packs/day for 10.0 years (5.0 ttl pk-yrs)    Types: Cigarettes   Smokeless tobacco: Never  Vaping Use   Vaping status: Never Used  Substance and Sexual Activity   Alcohol use: Yes    Alcohol/week: 6.0 standard drinks of alcohol    Types: 6 Shots of liquor per week    Comment: 4-5 days per week   Drug use: No  Sexual activity: Not Currently  Other Topics Concern   Not on file  Social History Narrative   Not on file   Social Determinants of Health   Financial Resource Strain: Not on file  Food Insecurity: Not on file  Transportation Needs: Not on file  Physical Activity: Not on file  Stress: Not on file  Social Connections: Not on file  Intimate Partner Violence: Not on file    Review of Systems     Objective:   Physical Exam Vitals reviewed.  Constitutional:      Appearance: Normal appearance. He is obese.  Cardiovascular:     Rate and Rhythm: Normal rate and regular rhythm.     Heart sounds: Normal heart sounds. No murmur heard.    No friction rub. No gallop.  Pulmonary:     Effort: Pulmonary effort is normal. No respiratory distress.     Breath sounds: Normal breath sounds. No wheezing, rhonchi or rales.  Chest:     Chest wall: No tenderness.  Abdominal:     General: Bowel sounds are normal. There is no distension.     Palpations: Abdomen is soft.     Tenderness: There is no abdominal tenderness. There is no guarding or rebound.  Musculoskeletal:     Right lower leg: No edema.     Left lower leg: No edema.  Neurological:     Mental Status: He is alert.           Assessment  & Plan:  History of pancreatitis - Plan: CBC with Differential/Platelet, Lipid panel, COMPLETE METABOLIC PANEL WITH GFR, Hemoglobin A1c  Prediabetes - Plan: CBC with Differential/Platelet, Lipid panel, COMPLETE METABOLIC PANEL WITH GFR, Hemoglobin A1c  Attention deficit disorder, unspecified type Continue to encourage abstinence from alcohol.  My original assessment of possible chronic pancreatitis was wrong.  He DOES NOT have chronic pancreatitis.  His symptoms then were due to a prolonged episode of acute pancreatitis.  Check cbc, cmp, hga1c, flp.  Resume vyvanse 30 mg poqday.

## 2022-10-22 LAB — HEMOGLOBIN A1C
Hgb A1c MFr Bld: 5.9 %{Hb} — ABNORMAL HIGH (ref <5.7)
Mean Plasma Glucose: 123 mg/dL
eAG (mmol/L): 6.8 mmol/L

## 2022-10-22 LAB — LIPID PANEL
Cholesterol: 246 mg/dL — ABNORMAL HIGH (ref <200)
HDL: 39 mg/dL — ABNORMAL LOW (ref >=40)
LDL Cholesterol (Calc): 162 mg/dL — ABNORMAL HIGH
Non-HDL Cholesterol (Calc): 207 mg/dL — ABNORMAL HIGH (ref <130)
Total CHOL/HDL Ratio: 6.3 (calc) — ABNORMAL HIGH (ref <5.0)
Triglycerides: 277 mg/dL — ABNORMAL HIGH (ref <150)

## 2022-10-22 LAB — CBC WITH DIFFERENTIAL/PLATELET
Absolute Monocytes: 412 {cells}/uL (ref 200–950)
Basophils Absolute: 52 {cells}/uL (ref 0–200)
Basophils Relative: 0.9 %
Eosinophils Absolute: 110 {cells}/uL (ref 15–500)
Eosinophils Relative: 1.9 %
HCT: 47.4 % (ref 38.5–50.0)
Hemoglobin: 16.4 g/dL (ref 13.2–17.1)
Lymphs Abs: 1375 {cells}/uL (ref 850–3900)
MCH: 32.2 pg (ref 27.0–33.0)
MCHC: 34.6 g/dL (ref 32.0–36.0)
MCV: 93.1 fL (ref 80.0–100.0)
MPV: 10.8 fL (ref 7.5–12.5)
Monocytes Relative: 7.1 %
Neutro Abs: 3851 {cells}/uL (ref 1500–7800)
Neutrophils Relative %: 66.4 %
Platelets: 112 10*3/uL — ABNORMAL LOW (ref 140–400)
RBC: 5.09 10*6/uL (ref 4.20–5.80)
RDW: 13 % (ref 11.0–15.0)
Total Lymphocyte: 23.7 %
WBC: 5.8 10*3/uL (ref 3.8–10.8)

## 2022-10-22 LAB — COMPLETE METABOLIC PANEL WITH GFR
AG Ratio: 2 (calc) (ref 1.0–2.5)
ALT: 27 U/L (ref 9–46)
AST: 17 U/L (ref 10–40)
Albumin: 4.7 g/dL (ref 3.6–5.1)
Alkaline phosphatase (APISO): 98 U/L (ref 36–130)
BUN: 18 mg/dL (ref 7–25)
CO2: 23 mmol/L (ref 20–32)
Calcium: 9.4 mg/dL (ref 8.6–10.3)
Chloride: 106 mmol/L (ref 98–110)
Creat: 1.06 mg/dL (ref 0.60–1.26)
Globulin: 2.4 g/dL (ref 1.9–3.7)
Glucose, Bld: 110 mg/dL — ABNORMAL HIGH (ref 65–99)
Potassium: 4.3 mmol/L (ref 3.5–5.3)
Sodium: 138 mmol/L (ref 135–146)
Total Bilirubin: 0.5 mg/dL (ref 0.2–1.2)
Total Protein: 7.1 g/dL (ref 6.1–8.1)
eGFR: 94 mL/min/{1.73_m2} (ref >=60)

## 2022-10-25 ENCOUNTER — Other Ambulatory Visit: Payer: Self-pay

## 2022-10-25 DIAGNOSIS — E785 Hyperlipidemia, unspecified: Secondary | ICD-10-CM

## 2022-10-25 MED ORDER — ROSUVASTATIN CALCIUM 10 MG PO TABS
10.0000 mg | ORAL_TABLET | Freq: Every day | ORAL | 3 refills | Status: AC
Start: 2022-10-25 — End: ?

## 2022-11-21 ENCOUNTER — Telehealth: Payer: Self-pay

## 2022-11-21 ENCOUNTER — Other Ambulatory Visit: Payer: Self-pay | Admitting: Family Medicine

## 2022-11-21 MED ORDER — LISDEXAMFETAMINE DIMESYLATE 30 MG PO CAPS
30.0000 mg | ORAL_CAPSULE | Freq: Every day | ORAL | 0 refills | Status: DC
Start: 2022-11-21 — End: 2022-12-19

## 2022-11-21 NOTE — Telephone Encounter (Signed)
Pt called in to get a refill of this med lisdexamfetamine (VYVANSE) 70 MG capsule [[id number]]  DISCONTINUED. Pt stated at his last OV the system was down at office and pt had to get a paper script of this med. Pt would like for this med to be sent to this pharmacy please.   PHARMACY: [id number] [place], [place] - [address] AT [facility] [address] [facility] [place], [place] [address] Phone: [phone]  Fax: [phone]

## 2022-12-19 ENCOUNTER — Ambulatory Visit (INDEPENDENT_AMBULATORY_CARE_PROVIDER_SITE_OTHER): Payer: 59 | Admitting: Family Medicine

## 2022-12-19 ENCOUNTER — Encounter: Payer: Self-pay | Admitting: Family Medicine

## 2022-12-19 VITALS — BP 124/82 | HR 96 | Temp 98.6°F | Ht 70.0 in | Wt 244.2 lb

## 2022-12-19 DIAGNOSIS — Z0001 Encounter for general adult medical examination with abnormal findings: Secondary | ICD-10-CM | POA: Diagnosis not present

## 2022-12-19 DIAGNOSIS — E785 Hyperlipidemia, unspecified: Secondary | ICD-10-CM

## 2022-12-19 DIAGNOSIS — Z8719 Personal history of other diseases of the digestive system: Secondary | ICD-10-CM | POA: Diagnosis not present

## 2022-12-19 DIAGNOSIS — Z0000 Encounter for general adult medical examination without abnormal findings: Secondary | ICD-10-CM

## 2022-12-19 DIAGNOSIS — F988 Other specified behavioral and emotional disorders with onset usually occurring in childhood and adolescence: Secondary | ICD-10-CM

## 2022-12-19 DIAGNOSIS — E291 Testicular hypofunction: Secondary | ICD-10-CM

## 2022-12-19 DIAGNOSIS — R7303 Prediabetes: Secondary | ICD-10-CM

## 2022-12-19 MED ORDER — NIZORAL 1 % EX SHAM: 1.0000 | MEDICATED_SHAMPOO | CUTANEOUS | 11 refills | Status: DC

## 2022-12-19 MED ORDER — LISDEXAMFETAMINE DIMESYLATE 30 MG PO CAPS: 30.0000 mg | ORAL_CAPSULE | Freq: Every day | ORAL | 0 refills | Status: DC

## 2022-12-19 NOTE — Progress Notes (Signed)
Subjective:    Patient ID: Eric Hodge, male    DOB: 12/13/1985, 37 y.o.   MRN: [id number]  HPI  Patient is a very pleasant 37 year old Caucasian gentleman who presents today for for complete physical exam.  Past medical history significant for alcohol induced pancreatitis as well as alcoholism.  The patient is abstaining from alcohol.  His liver function test had normalized as of October.  However he still battles ADD.  He is currently using Vyvanse which works well for him.  He denies any chest pain shortness of breath or palpitations on Vyvanse.  He does continue to report fatigue and lack of energy.  He previously was diagnosed with hypergonadism with a testosterone level well below 200.  We have not resume the testosterone.  He would like to get this started back as he felt significantly better when he took the testosterone.  His flu shot is up-to-date.  His tetanus shot is up-to-date.  He denies any abdominal pain.  Recent lab work revealed significant elevations in his cholesterol.  He started the rosuvastatin. Past Medical History:  Diagnosis Date   ADHD (attention deficit hyperactivity disorder)    Alcohol use disorder 09/2021   Elevated LFTs    Hyperlipidemia    Pancreatitis, alcoholic, acute 09/2021   Retained orthopedic hardware 02/2017   right distal radius   Past Surgical History:  Procedure Laterality Date   BALLOON DILATION N/A 03/17/2022   Procedure: BALLOON DILATION;  Surgeon: Meridee Score Netty Starring., MD;  Location: [facility];  Service: Gastroenterology;  Laterality: N/A;   BIOPSY  02/17/2022   Procedure: BIOPSY;  Surgeon: Meridee Score Netty Starring., MD;  Location: [facility];  Service: Gastroenterology;;   Fidela Salisbury RELEASE Right 10/06/2016   Procedure: CARPAL TUNNEL RELEASE;  Surgeon: Betha Loa, MD;  Location: [facility];  Service: Orthopedics;  Laterality: Right;   CYST GASTROSTOMY  03/17/2022   Procedure: CYST GASTROSTOMY;  Surgeon: Meridee Score  Netty Starring., MD;  Location: [facility];  Service: Gastroenterology;;   ESOPHAGOGASTRODUODENOSCOPY (EGD) WITH PROPOFOL N/A 02/17/2022   Procedure: ESOPHAGOGASTRODUODENOSCOPY (EGD) WITH PROPOFOL;  Surgeon: Lemar Lofty., MD;  Location: [facility];  Service: Gastroenterology;  Laterality: N/A;   ESOPHAGOGASTRODUODENOSCOPY (EGD) WITH PROPOFOL N/A 03/17/2022   Procedure: ESOPHAGOGASTRODUODENOSCOPY (EGD) WITH PROPOFOL;  Surgeon: Meridee Score Netty Starring., MD;  [facility];  Service: Gastroenterology;  Laterality: N/A;   ESOPHAGOGASTRODUODENOSCOPY (EGD) WITH PROPOFOL N/A 04/28/2022   Procedure: ESOPHAGOGASTRODUODENOSCOPY (EGD) WITH PROPOFOL;  Surgeon: Meridee Score Netty Starring., MD;  [facility];  Service: Gastroenterology;  Laterality: N/A;   EUS N/A 02/17/2022   Procedure: UPPER ENDOSCOPIC ULTRASOUND (EUS) RADIAL;  Surgeon: Lemar Lofty., MD;  [facility];  Service: Gastroenterology;  Laterality: N/A;   EUS N/A 03/17/2022   Procedure: UPPER ENDOSCOPIC ULTRASOUND (EUS) RADIAL;  Surgeon: Lemar Lofty., MD;  [facility];  Service: Gastroenterology;  Laterality: N/A;   FOREIGN BODY REMOVAL  09/08/2011   Procedure: REMOVAL FOREIGN BODY EXTREMITY;  Surgeon: Tami Ribas, MD;  Location: [facility];  Service: Orthopedics;  Laterality: Left;  FOREIGN BODY REMOVAL LEFT LONG FINGER   HARDWARE REMOVAL Right 03/10/2017   Procedure: HARDWARE REMOVAL RIGHT WRIST;  Surgeon: Betha Loa, MD;  Location: [facility];  Service: Orthopedics;  Laterality: Right;   MASS EXCISION Left 10/29/2015   Procedure: LEFT WRIST EXCISION MASS;  Surgeon: Betha Loa, MD;  Location: [facility];  Service: Orthopedics;  Laterality: Left;  LEFT WRIST EXCISION MASS  OPEN REDUCTION INTERNAL FIXATION (ORIF) DISTAL RADIAL FRACTURE Right 10/06/2016   Procedure: OPEN REDUCTION INTERNAL FIXATION (ORIF) RIGHT DISTAL RADIUS;  Surgeon: Betha Loa, MD;  Location: [facility];  Service: Orthopedics;  Laterality: Right;   PANCREATIC STENT PLACEMENT  03/17/2022   Procedure: PANCREATIC STENT PLACEMENT;  Surgeon: Lemar Lofty., MD;  Location: [facility];  Service: Gastroenterology;;   Francine Graven REMOVAL  04/28/2022   Procedure: STENT REMOVAL;  Surgeon: Lemar Lofty., MD;  [facility];  Service: Gastroenterology;;   WISDOM TOOTH EXTRACTION  2012   Current Outpatient Medications on File Prior to Visit  Medication Sig Dispense Refill   Multiple Vitamin (MULTIVITAMIN WITH MINERALS) TABS tablet Take 1 tablet by mouth in the morning.     rosuvastatin (CRESTOR) 10 MG tablet Take 1 tablet (10 mg total) by mouth daily. 90 tablet 3   testosterone cypionate (DEPOTESTOSTERONE CYPIONATE) 200 MG/ML injection Inject 1 mL (200 mg total) into the muscle every 14 (fourteen) days. (Patient not taking: Reported on 12/19/2022) 6 mL 0   No current facility-administered medications on file prior to visit.   No Known Allergies Social History   Socioeconomic History   Marital status: Divorced    Spouse name: Not on file   Number of children: Not on file   Years of education: Not on file   Highest education level: Not on file  Occupational History   Not on file  Tobacco Use   Smoking status: Every Day    Current packs/day: 0.50    Average packs/day: 0.5 packs/day for 10.0 years (5.0 ttl pk-yrs)    Types: Cigarettes   Smokeless tobacco: Never  Vaping Use   Vaping status: Never Used  Substance and Sexual Activity   Alcohol use: Yes    Alcohol/week: 6.0 standard drinks of alcohol    Types: 6 Shots of liquor per week    Comment: 4-5 days per week   Drug use: No   Sexual activity: Not Currently  Other Topics Concern   Not on file  Social History Narrative   Not on file   Social Determinants of Health   Financial Resource Strain: Not on file  Food Insecurity: Not on file  Transportation Needs: Not on  file  Physical Activity: Not on file  Stress: Not on file  Social Connections: Not on file  Intimate Partner Violence: Not on file    Review of Systems     Objective:   Physical Exam Vitals reviewed.  Constitutional:      Appearance: Normal appearance. He is obese.  Cardiovascular:     Rate and Rhythm: Normal rate and regular rhythm.     Heart sounds: Normal heart sounds. No murmur heard.    No friction rub. No gallop.  Pulmonary:     Effort: Pulmonary effort is normal. No respiratory distress.     Breath sounds: Normal breath sounds. No wheezing, rhonchi or rales.  Chest:     Chest wall: No tenderness.  Abdominal:     General: Bowel sounds are normal. There is no distension.     Palpations: Abdomen is soft.     Tenderness: There is no abdominal tenderness. There is no guarding or rebound.  Musculoskeletal:     Right lower leg: No edema.     Left lower leg: No edema.  Neurological:     Mental Status: He is alert.           Assessment & Plan:  Hypogonadism in male -  Plan: Testosterone Total,Free,Bio, Males, PSA  History of pancreatitis  Prediabetes  Attention deficit disorder, unspecified type  Hyperlipidemia, unspecified hyperlipidemia type  General medical exam Continue to encourage abstinence from alcohol.  Liver function test normalized.  Recommended repeating cholesterol in 3 months once the patient has been on the Crestor.  Check baseline PSA and testosterone level today.  If testosterone remains low, he should on testosterone cypionate 200 mg every 2 weeks IM.  Immunization are up-to-date.  Cancer screening is not yet due.  Refilled his Vyvanse.

## 2022-12-20 LAB — TESTOSTERONE TOTAL,FREE,BIO, MALES
Albumin: 5.1 g/dL (ref 3.6–5.1)
Sex Hormone Binding: 18 nmol/L (ref 10–50)
Testosterone, Bioavailable: 140.6 ng/dL (ref 110.0–575.0)
Testosterone, Free: 60.7 pg/mL (ref 46.0–224.0)
Testosterone: 321 ng/dL (ref 250–827)

## 2022-12-20 LAB — PSA: PSA: 0.6 ng/mL (ref <=4.00)

## 2023-01-20 ENCOUNTER — Other Ambulatory Visit: Payer: Self-pay | Admitting: Family Medicine

## 2023-01-20 MED ORDER — LISDEXAMFETAMINE DIMESYLATE 30 MG PO CAPS: 30.0000 mg | ORAL_CAPSULE | Freq: Every day | ORAL | 0 refills | Status: DC

## 2023-02-20 ENCOUNTER — Other Ambulatory Visit: Payer: Self-pay | Admitting: Family Medicine

## 2023-02-20 MED ORDER — LISDEXAMFETAMINE DIMESYLATE 30 MG PO CAPS: 30.0000 mg | ORAL_CAPSULE | Freq: Every day | ORAL | 0 refills | Status: DC

## 2023-03-28 ENCOUNTER — Other Ambulatory Visit: Payer: Self-pay | Admitting: Family Medicine

## 2023-03-28 MED ORDER — LISDEXAMFETAMINE DIMESYLATE 30 MG PO CAPS: 30.0000 mg | ORAL_CAPSULE | Freq: Every day | ORAL | 0 refills | Status: DC

## 2023-04-25 ENCOUNTER — Encounter: Payer: Self-pay | Admitting: Family Medicine

## 2023-04-25 ENCOUNTER — Ambulatory Visit (INDEPENDENT_AMBULATORY_CARE_PROVIDER_SITE_OTHER): Payer: 59 | Admitting: Family Medicine

## 2023-04-25 VITALS — BP 118/62 | HR 96 | Temp 97.5°F | Ht 70.0 in | Wt 227.0 lb

## 2023-04-25 DIAGNOSIS — L219 Seborrheic dermatitis, unspecified: Secondary | ICD-10-CM

## 2023-04-25 DIAGNOSIS — E7800 Pure hypercholesterolemia, unspecified: Secondary | ICD-10-CM | POA: Diagnosis not present

## 2023-04-25 DIAGNOSIS — R7303 Prediabetes: Secondary | ICD-10-CM | POA: Diagnosis not present

## 2023-04-25 MED ORDER — DOXYCYCLINE HYCLATE 100 MG PO TABS: 100.0000 mg | ORAL_TABLET | Freq: Two times a day (BID) | ORAL | 0 refills | Status: AC

## 2023-04-25 MED ORDER — CLOTRIMAZOLE 1 % EX CREA: 1.0000 | TOPICAL_CREAM | Freq: Two times a day (BID) | CUTANEOUS | 0 refills | Status: AC

## 2023-04-25 MED ORDER — PREDNISONE 20 MG PO TABS: ORAL_TABLET | ORAL | 0 refills | Status: AC

## 2023-04-25 NOTE — Progress Notes (Signed)
 Subjective:    Patient ID: Eric Hodge, male    DOB: 10-Aug-1985, 38 y.o.   MRN: [id number]  HPI  Patient is a very pleasant 38 year old Caucasian gentleman who presents today for follow-up of his hyperlipidemia.  He is currently on rosuvastatin.  He denies any myalgias.  We are monitoring his liver function test closely due to his history of alcohol induced hepatitis.  He also has a history of alcohol induced pancreatitis.  His last attack was in May 2024.  He states that he is abstaining from alcohol.  He denies any abdominal pain.  He denies any bloating nausea vomiting or diarrhea with food.  We are due to recheck his blood sugar as well.  He had hyperglycemia related to his pancreatitis.  His most recent blood sugar fasting was 110.  Therefore he has mild prediabetes.  Patient does have an erythematous rash.  He primarily is on his cheeks out his nose also above his eyebrows and his scalp adjacent to the hairline.  I have treated the patient for seborrheic dermatitis with Nizoral shampoo and hydrocortisone cream.  Nothing has helped.  This raises the concern hospitalization.  He would like to see a dermatologist for this. Past Medical History:  Diagnosis Date  . ADHD (attention deficit hyperactivity disorder)   . Alcohol use disorder 09/2021  . Elevated LFTs   . Hyperlipidemia   . Pancreatitis, alcoholic, acute 09/2021  . Retained orthopedic hardware 02/2017   right distal radius   Past Surgical History:  Procedure Laterality Date  . BALLOON DILATION N/A 03/17/2022   Procedure: BALLOON DILATION;  Surgeon: Meridee Score Netty Starring., MD;  Location: [facility];  Service: Gastroenterology;  Laterality: N/A;  . BIOPSY  02/17/2022   Procedure: BIOPSY;  Surgeon: Meridee Score Netty Starring., MD;  Location: [facility];  Service: Gastroenterology;;  . Fidela Salisbury RELEASE Right 10/06/2016   Procedure: CARPAL TUNNEL RELEASE;  Surgeon: Betha Loa, MD;  Location: [facility];  Service:  Orthopedics;  Laterality: Right;  . CYST GASTROSTOMY  03/17/2022   Procedure: CYST GASTROSTOMY;  Surgeon: Meridee Score Netty Starring., MD;  Location: [facility];  Service: Gastroenterology;;  . ESOPHAGOGASTRODUODENOSCOPY (EGD) WITH PROPOFOL N/A 02/17/2022   Procedure: ESOPHAGOGASTRODUODENOSCOPY (EGD) WITH PROPOFOL;  Surgeon: Lemar Lofty., MD;  Location: [facility];  Service: Gastroenterology;  Laterality: N/A;  . ESOPHAGOGASTRODUODENOSCOPY (EGD) WITH PROPOFOL N/A 03/17/2022   Procedure: ESOPHAGOGASTRODUODENOSCOPY (EGD) WITH PROPOFOL;  Surgeon: Meridee Score Netty Starring., MD;  [facility];  Service: Gastroenterology;  Laterality: N/A;  . ESOPHAGOGASTRODUODENOSCOPY (EGD) WITH PROPOFOL N/A 04/28/2022   Procedure: ESOPHAGOGASTRODUODENOSCOPY (EGD) WITH PROPOFOL;  Surgeon: Meridee Score Netty Starring., MD;  [facility];  Service: Gastroenterology;  Laterality: N/A;  . EUS N/A 02/17/2022   Procedure: UPPER ENDOSCOPIC ULTRASOUND (EUS) RADIAL;  Surgeon: Meridee Score Netty Starring., MD;  Location: [facility];  Service: Gastroenterology;  Laterality: N/A;  . EUS N/A 03/17/2022   Procedure: UPPER ENDOSCOPIC ULTRASOUND (EUS) RADIAL;  Surgeon: Meridee Score Netty Starring., MD;  Location: [facility];  Service: Gastroenterology;  Laterality: N/A;  . FOREIGN BODY REMOVAL  09/08/2011   Procedure: REMOVAL FOREIGN BODY EXTREMITY;  Surgeon: Tami Ribas, MD;  Location: [facility];  Service: Orthopedics;  Laterality: Left;  FOREIGN BODY REMOVAL LEFT LONG FINGER  . HARDWARE REMOVAL Right 03/10/2017   Procedure: HARDWARE REMOVAL RIGHT WRIST;  Surgeon: Betha Loa, MD;  Location: [facility];  Service: Orthopedics;  Laterality: Right;  . MASS EXCISION Left 10/29/2015   Procedure: LEFT WRIST EXCISION  MASS;  Surgeon: Betha Loa, MD;  Location: [facility];  Service: Orthopedics;  Laterality: Left;  LEFT WRIST EXCISION MASS  . OPEN REDUCTION INTERNAL FIXATION (ORIF)  DISTAL RADIAL FRACTURE Right 10/06/2016   Procedure: OPEN REDUCTION INTERNAL FIXATION (ORIF) RIGHT DISTAL RADIUS;  Surgeon: Betha Loa, MD;  Location: [facility];  Service: Orthopedics;  Laterality: Right;  . PANCREATIC STENT PLACEMENT  03/17/2022   Procedure: PANCREATIC STENT PLACEMENT;  Surgeon: Meridee Score Netty Starring., MD;  Location: [facility];  Service: Gastroenterology;;  . Francine Graven REMOVAL  04/28/2022   Procedure: STENT REMOVAL;  Surgeon: Lemar Lofty., MD;  Location: [facility];  Service: Gastroenterology;;  . [place] TOOTH EXTRACTION  2012   Current Outpatient Medications on File Prior to Visit  Medication Sig Dispense Refill  . KETOCONAZOLE, TOPICAL, (NIZORAL) 1 % SHAM Apply 1 Application topically 2 (two) times a week. 200 mL 11  . lisdexamfetamine (VYVANSE) 30 MG capsule Take 1 capsule (30 mg total) by mouth daily. 30 capsule 0  . Multiple Vitamin (MULTIVITAMIN WITH MINERALS) TABS tablet Take 1 tablet by mouth in the morning.    . rosuvastatin (CRESTOR) 10 MG tablet Take 1 tablet (10 mg total) by mouth daily. 90 tablet 3   No current facility-administered medications on file prior to visit.   No Known Allergies Social History   Socioeconomic History  . Marital status: Divorced    Spouse name: Not on file  . Number of children: Not on file  . Years of education: Not on file  . Highest education level: Some college, no degree  Occupational History  . Not on file  Tobacco Use  . Smoking status: Every Day    Current packs/day: 0.50    Average packs/day: 0.5 packs/day for 10.0 years (5.0 ttl pk-yrs)    Types: Cigarettes  . Smokeless tobacco: Never  Vaping Use  . Vaping status: Never Used  Substance and Sexual Activity  . Alcohol use: Yes    Alcohol/week: 6.0 standard drinks of alcohol    Types: 6 Shots of liquor per week    Comment: 4-5 days per week  . Drug use: No  . Sexual activity: Not Currently  Other Topics Concern  . Not on file   Social History Narrative  . Not on file   Social Drivers of Health   Financial Resource Strain: Low Risk  (04/24/2023)   Overall Financial Resource Strain (CARDIA)   . Difficulty of Paying Living Expenses: Not very hard  Food Insecurity: No Food Insecurity (04/24/2023)   Hunger Vital Sign   . Worried About Programme researcher, broadcasting/film/video in the Last Year: Never true   . Ran Out of Food in the Last Year: Never true  Transportation Needs: No Transportation Needs (04/24/2023)   PRAPARE - Transportation   . Lack of Transportation (Medical): No   . Lack of Transportation (Non-Medical): No  Physical Activity: Sufficiently Active (04/24/2023)   Exercise Vital Sign   . Days of Exercise per Week: 4 days   . Minutes of Exercise per Session: 40 min  Stress: No Stress Concern Present (04/24/2023)   [employer] of Occupational Health - Occupational Stress Questionnaire   . Feeling of Stress : Only a little  Social Connections: Socially Isolated (04/24/2023)   Social Connection and Isolation Panel [NHANES]   . Frequency of Communication with Friends and Family: More than three times a week   . Frequency of Social Gatherings with Friends and Family: Once a week   .  Attends Religious Services: Never   . Active Member of Clubs or Organizations: No   . Attends Banker Meetings: Not on file   . Marital Status: Divorced  Catering manager Violence: Not on file    Review of Systems     Objective:   Physical Exam Vitals reviewed.  Constitutional:      Appearance: Normal appearance. He is obese.  HENT:     Head:   Cardiovascular:     Rate and Rhythm: Normal rate and regular rhythm.     Heart sounds: Normal heart sounds. No murmur heard.    No friction rub. No gallop.  Pulmonary:     Effort: Pulmonary effort is normal. No respiratory distress.     Breath sounds: Normal breath sounds. No wheezing, rhonchi or rales.  Chest:     Chest wall: No tenderness.  Abdominal:     General: There is no  distension.     Tenderness: There is no abdominal tenderness.  Musculoskeletal:     Right lower leg: No edema.     Left lower leg: No edema.  Skin:    Findings: Erythema and rash present. Rash is macular and papular.  Neurological:     Mental Status: He is alert.          Assessment & Plan:  Pure hypercholesterolemia - Plan: Hemoglobin A1c, CBC with Differential/Platelet, COMPLETE METABOLIC PANEL WITHOUT GFR, Lipid panel  Prediabetes - Plan: Hemoglobin A1c, CBC with Differential/Platelet, COMPLETE METABOLIC PANEL WITHOUT GFR, Lipid panel  Seborrheic dermatitis - Plan: Ambulatory referral to Dermatology Continue to encourage abstinence from alcohol.  Recheck cholesterol.  Goal of the alcohol is less than 100.  Monitor blood sugar closely.  Consult dermatology.  Treatment for seborrheic dermatitis has been unsuccessful.  Will try the patient empirically on doxycycline 100 mg twice daily for possible rosacea in case my initial diagnosis was incorrect.

## 2023-04-26 LAB — COMPLETE METABOLIC PANEL WITHOUT GFR
AG Ratio: 2.2 (calc) (ref 1.0–2.5)
ALT: 79 U/L — ABNORMAL HIGH (ref 9–46)
AST: 55 U/L — ABNORMAL HIGH (ref 10–40)
Albumin: 4.8 g/dL (ref 3.6–5.1)
Alkaline phosphatase (APISO): 80 U/L (ref 36–130)
BUN: 14 mg/dL (ref 7–25)
CO2: 25 mmol/L (ref 20–32)
Calcium: 9.6 mg/dL (ref 8.6–10.3)
Chloride: 108 mmol/L (ref 98–110)
Creat: 1.08 mg/dL (ref 0.60–1.26)
Globulin: 2.2 g/dL (ref 1.9–3.7)
Glucose, Bld: 104 mg/dL — ABNORMAL HIGH (ref 65–99)
Potassium: 4.4 mmol/L (ref 3.5–5.3)
Sodium: 143 mmol/L (ref 135–146)
Total Bilirubin: 0.6 mg/dL (ref 0.2–1.2)
Total Protein: 7 g/dL (ref 6.1–8.1)

## 2023-04-26 LAB — HEMOGLOBIN A1C
Hgb A1c MFr Bld: 5.9 %{Hb} — ABNORMAL HIGH (ref <5.7)
Mean Plasma Glucose: 123 mg/dL
eAG (mmol/L): 6.8 mmol/L

## 2023-04-26 LAB — CBC WITH DIFFERENTIAL/PLATELET
Absolute Lymphocytes: 1410 {cells}/uL (ref 850–3900)
Absolute Monocytes: 360 {cells}/uL (ref 200–950)
Basophils Absolute: 59 {cells}/uL (ref 0–200)
Basophils Relative: 1 %
Eosinophils Absolute: 159 {cells}/uL (ref 15–500)
Eosinophils Relative: 2.7 %
HCT: 52.8 % — ABNORMAL HIGH (ref 38.5–50.0)
Hemoglobin: 18.1 g/dL — ABNORMAL HIGH (ref 13.2–17.1)
MCH: 34 pg — ABNORMAL HIGH (ref 27.0–33.0)
MCHC: 34.3 g/dL (ref 32.0–36.0)
MCV: 99.2 fL (ref 80.0–100.0)
MPV: 10.9 fL (ref 7.5–12.5)
Monocytes Relative: 6.1 %
Neutro Abs: 3912 {cells}/uL (ref 1500–7800)
Neutrophils Relative %: 66.3 %
Platelets: 182 10*3/uL (ref 140–400)
RBC: 5.32 10*6/uL (ref 4.20–5.80)
RDW: 12.7 % (ref 11.0–15.0)
Total Lymphocyte: 23.9 %
WBC: 5.9 10*3/uL (ref 3.8–10.8)

## 2023-04-26 LAB — LIPID PANEL
Cholesterol: 136 mg/dL (ref <200)
HDL: 46 mg/dL (ref >=40)
LDL Cholesterol (Calc): 68 mg/dL
Non-HDL Cholesterol (Calc): 90 mg/dL (ref <130)
Total CHOL/HDL Ratio: 3 (calc) (ref <5.0)
Triglycerides: 138 mg/dL (ref <150)

## 2023-05-02 ENCOUNTER — Other Ambulatory Visit: Payer: Self-pay | Admitting: Family Medicine

## 2023-05-02 MED ORDER — LISDEXAMFETAMINE DIMESYLATE 30 MG PO CAPS: 30.0000 mg | ORAL_CAPSULE | Freq: Every day | ORAL | 0 refills | Status: DC

## 2023-05-15 ENCOUNTER — Encounter: Payer: Self-pay | Admitting: Family Medicine

## 2023-05-30 ENCOUNTER — Other Ambulatory Visit: Payer: Self-pay | Admitting: Family Medicine

## 2023-06-01 MED ORDER — LISDEXAMFETAMINE DIMESYLATE 30 MG PO CAPS: 30.0000 mg | ORAL_CAPSULE | Freq: Every day | ORAL | 0 refills | Status: DC

## 2023-07-10 ENCOUNTER — Other Ambulatory Visit: Payer: Self-pay | Admitting: Family Medicine

## 2023-07-10 NOTE — Telephone Encounter (Unsigned)
 Copied from CRM [id number]. Topic: Clinical - Medication Refill >> Jul 10, 2023 11:14 AM Selinda RAMAN wrote: Medication: lisdexamfetamine (VYVANSE ) 30 MG capsule  Has the patient contacted their pharmacy? No   This is the patient's preferred pharmacy:  [id number] [place], [place] - [address] [facility] [address] [facility] [address] [place] [address] Phone: [phone] Fax: [phone]  Is this the correct pharmacy for this prescription? Yes If no, delete pharmacy and type the correct one.   Has the prescription been filled recently? No  Is the patient out of the medication? No  Has the patient been seen for an appointment in the last year OR does the patient have an upcoming appointment? Yes  Can we respond through MyChart? Yes  Please assist patient appointment

## 2023-07-11 NOTE — Telephone Encounter (Signed)
 Requested medications are due for refill today.  yes  Requested medications are on the active medications list.  yes  Last refill. 06/01/2023 #30 0 rf  Future visit scheduled.   yes  Notes to clinic.  Refill not delegated.    Requested Prescriptions  Pending Prescriptions Disp Refills   lisdexamfetamine (VYVANSE ) 30 MG capsule 30 capsule 0    Sig: Take 1 capsule (30 mg total) by mouth daily.     Not Delegated - Psychiatry:  Stimulants/ADHD Failed - 07/11/2023  5:52 PM      Failed - This refill cannot be delegated      Failed - Urine Drug Screen completed in last 360 days      Passed - Last BP in normal range    BP Readings from Last 1 Encounters:  04/25/23 118/62         Passed - Last Heart Rate in normal range    Pulse Readings from Last 1 Encounters:  04/25/23 96         Passed - Valid encounter within last 6 months    Recent Outpatient Visits           2 months ago Pure hypercholesterolemia   [place] Duanne, Butler DASEN, MD   6 months ago Hypogonadism in male   [facility] Duanne, Butler DASEN, MD   8 months ago History of pancreatitis   [place] [facility] Duanne Butler DASEN, MD   1 year ago Chronic pancreatitis, unspecified pancreatitis type [facility])   [place] [facility] Duanne Butler DASEN, MD   1 year ago General medical exam   [place] [facility] Duanne Butler DASEN, MD       Future Appointments             In 5 months Pickard, Butler DASEN, MD [facility], [facility]

## 2023-07-13 MED ORDER — LISDEXAMFETAMINE DIMESYLATE 30 MG PO CAPS: 30.0000 mg | ORAL_CAPSULE | Freq: Every day | ORAL | 0 refills | Status: DC

## 2023-07-25 ENCOUNTER — Ambulatory Visit (INDEPENDENT_AMBULATORY_CARE_PROVIDER_SITE_OTHER): Admitting: Family Medicine

## 2023-07-25 VITALS — BP 130/80 | HR 119 | Temp 98.5°F | Ht 70.0 in | Wt 217.2 lb

## 2023-07-25 DIAGNOSIS — L219 Seborrheic dermatitis, unspecified: Secondary | ICD-10-CM

## 2023-07-25 DIAGNOSIS — E7800 Pure hypercholesterolemia, unspecified: Secondary | ICD-10-CM | POA: Diagnosis not present

## 2023-07-25 DIAGNOSIS — F988 Other specified behavioral and emotional disorders with onset usually occurring in childhood and adolescence: Secondary | ICD-10-CM | POA: Diagnosis not present

## 2023-07-25 MED ORDER — ZORYVE 0.3 % EX FOAM: 1.0000 | Freq: Every day | CUTANEOUS | 11 refills | Status: AC

## 2023-07-25 MED ORDER — LISDEXAMFETAMINE DIMESYLATE 30 MG PO CAPS: 30.0000 mg | ORAL_CAPSULE | Freq: Every day | ORAL | 0 refills | Status: DC

## 2023-07-25 NOTE — Progress Notes (Signed)
 Subjective:    Patient ID: Eric Hodge, male    DOB: Jul 08, 1985, 38 y.o.   MRN: [id number]  HPI  Patient is a very pleasant 38 year old Caucasian gentleman who presents today for follow-up of his ADD.  He is currently on Vyvanse  30 mg daily.  The current dose that he is taking is working sufficiently for him.  He denies any chest pain shortness of breath or dyspnea on exertion.  He denies palpitations.  Unfortunately he is making long-haul him.  Currently on statin therapy hyperlipidemia.  He denies any right upper quadrant pain lab work in April, there was a mild elevation in his liver function test.  He is due to recheck that.  Also on his labs in April, his hemoglobin was elevated to greater than 18.  Patient has discontinued testosterone  therapy.  He is due to recheck his hemoglobin.  However he would like to check his blood work in 1 month.  He states that he is been drinking recently and he would like to abstain from alcohol prior to checking his lab work. Past Medical History:  Diagnosis Date   ADHD (attention deficit hyperactivity disorder)    Alcohol use disorder 09/2021   Elevated LFTs    Hyperlipidemia    Pancreatitis, alcoholic, acute 09/2021   Retained orthopedic hardware 02/2017   right distal radius   Past Surgical History:  Procedure Laterality Date   BALLOON DILATION N/A 03/17/2022   Procedure: BALLOON DILATION;  Surgeon: Wilhelmenia Aloha Raddle., MD;  Location: [facility];  Service: Gastroenterology;  Laterality: N/A;   BIOPSY  02/17/2022   Procedure: BIOPSY;  Surgeon: Wilhelmenia Aloha Raddle., MD;  Location: [facility];  Service: Gastroenterology;;   ORIN MEDIATE RELEASE Right 10/06/2016   Procedure: CARPAL TUNNEL RELEASE;  Surgeon: Murrell Drivers, MD;  Location: [facility];  Service: Orthopedics;  Laterality: Right;   CYST GASTROSTOMY  03/17/2022   Procedure: CYST GASTROSTOMY;  Surgeon: Wilhelmenia Aloha Raddle., MD;  Location: [facility];  Service:  Gastroenterology;;   ESOPHAGOGASTRODUODENOSCOPY (EGD) WITH PROPOFOL  N/A 02/17/2022   Procedure: ESOPHAGOGASTRODUODENOSCOPY (EGD) WITH PROPOFOL ;  Surgeon: Wilhelmenia Aloha Raddle., MD;  Location: [facility];  Service: Gastroenterology;  Laterality: N/A;   ESOPHAGOGASTRODUODENOSCOPY (EGD) WITH PROPOFOL  N/A 03/17/2022   Procedure: ESOPHAGOGASTRODUODENOSCOPY (EGD) WITH PROPOFOL ;  Surgeon: Wilhelmenia Aloha Raddle., MD;  Location: [facility];  Service: Gastroenterology;  Laterality: N/A;   ESOPHAGOGASTRODUODENOSCOPY (EGD) WITH PROPOFOL  N/A 04/28/2022   Procedure: ESOPHAGOGASTRODUODENOSCOPY (EGD) WITH PROPOFOL ;  Surgeon: Wilhelmenia Aloha Raddle., MD;  Location: [facility];  Service: Gastroenterology;  Laterality: N/A;   EUS N/A 02/17/2022   Procedure: UPPER ENDOSCOPIC ULTRASOUND (EUS) RADIAL;  Surgeon: Wilhelmenia Aloha Raddle., MD;  Location: [facility];  Service: Gastroenterology;  Laterality: N/A;   EUS N/A 03/17/2022   Procedure: UPPER ENDOSCOPIC ULTRASOUND (EUS) RADIAL;  Surgeon: Wilhelmenia Aloha Raddle., MD;  Location: [facility];  Service: Gastroenterology;  Laterality: N/A;   FOREIGN BODY REMOVAL  09/08/2011   Procedure: REMOVAL FOREIGN BODY EXTREMITY;  Surgeon: Drivers JONELLE Murrell, MD;  Location: [facility];  Service: Orthopedics;  Laterality: Left;  FOREIGN BODY REMOVAL LEFT LONG FINGER   HARDWARE REMOVAL Right 03/10/2017   Procedure: HARDWARE REMOVAL RIGHT WRIST;  Surgeon: Murrell Drivers, MD;  Location: [facility];  Service: Orthopedics;  Laterality: Right;   MASS EXCISION Left 10/29/2015   Procedure: LEFT WRIST EXCISION MASS;  Surgeon: Drivers Murrell, MD;  Location: [facility];  Service: Orthopedics;  Laterality: Left;  LEFT WRIST EXCISION MASS  OPEN REDUCTION INTERNAL FIXATION (ORIF) DISTAL RADIAL FRACTURE Right 10/06/2016   Procedure: OPEN REDUCTION INTERNAL FIXATION (ORIF) RIGHT DISTAL RADIUS;  Surgeon: Murrell Drivers, MD;  Location: [facility];   Service: Orthopedics;  Laterality: Right;   PANCREATIC STENT PLACEMENT  03/17/2022   Procedure: PANCREATIC STENT PLACEMENT;  Surgeon: Wilhelmenia Aloha Raddle., MD;  Location: [facility];  Service: Gastroenterology;;   CLEDA REMOVAL  04/28/2022   Procedure: STENT REMOVAL;  Surgeon: Wilhelmenia Aloha Raddle., MD;  Location: [facility];  Service: Gastroenterology;;   WISDOM TOOTH EXTRACTION  2012   Current Outpatient Medications on File Prior to Visit  Medication Sig Dispense Refill   clotrimazole  (LOTRIMIN ) 1 % cream Apply 1 Application topically 2 (two) times daily. 30 g 0   doxycycline  (VIBRA -TABS) 100 MG tablet Take 1 tablet (100 mg total) by mouth 2 (two) times daily. 60 tablet 0   KETOCONAZOLE , TOPICAL, (NIZORAL ) 1 % SHAM Apply 1 Application topically 2 (two) times a week. 200 mL 11   Multiple Vitamin (MULTIVITAMIN WITH MINERALS) TABS tablet Take 1 tablet by mouth in the morning.     predniSONE  (DELTASONE ) 20 MG tablet 3 tabs poqday 1-2, 2 tabs poqday 3-4, 1 tab poqday 5-6 12 tablet 0   rosuvastatin  (CRESTOR ) 10 MG tablet Take 1 tablet (10 mg total) by mouth daily. 90 tablet 3   No current facility-administered medications on file prior to visit.   No Known Allergies Social History   Socioeconomic History   Marital status: Divorced    Spouse name: Not on file   Number of children: Not on file   Years of education: Not on file   Highest education level: 12th grade  Occupational History   Not on file  Tobacco Use   Smoking status: Every Day    Current packs/day: 0.50    Average packs/day: 0.5 packs/day for 10.0 years (5.0 ttl pk-yrs)    Types: Cigarettes   Smokeless tobacco: Never  Vaping Use   Vaping status: Never Used  Substance and Sexual Activity   Alcohol use: Yes    Alcohol/week: 6.0 standard drinks of alcohol    Types: 6 Shots of liquor per week    Comment: 4-5 days per week   Drug use: No   Sexual activity: Not Currently  Other Topics Concern   Not on file   Social History Narrative   Not on file   Social Drivers of Health   Financial Resource Strain: Low Risk  (07/25/2023)   Overall Financial Resource Strain (CARDIA)    Difficulty of Paying Living Expenses: Not hard at all  Food Insecurity: No Food Insecurity (07/25/2023)   Hunger Vital Sign    Worried About Running Out of Food in the Last Year: Never true    Ran Out of Food in the Last Year: Never true  Transportation Needs: No Transportation Needs (07/25/2023)   PRAPARE - Administrator, Civil Service (Medical): No    Lack of Transportation (Non-Medical): No  Physical Activity: Sufficiently Active (07/25/2023)   Exercise Vital Sign    Days of Exercise per Week: 3 days    Minutes of Exercise per Session: 50 min  Stress: Stress Concern Present (07/25/2023)   [employer] of Occupational Health - Occupational Stress Questionnaire    Feeling of Stress: To some extent  Social Connections: Socially Isolated (07/25/2023)   Social Connection and Isolation Panel    Frequency of Communication with Friends and Family: More than three times a week  Frequency of Social Gatherings with Friends and Family: Once a week    Attends Religious Services: Never    Database administrator or Organizations: No    Attends Engineer, structural: Not on file    Marital Status: Divorced  Intimate Partner Violence: Not on file    Review of Systems     Objective:   Physical Exam Vitals reviewed.  Constitutional:      Appearance: Normal appearance. He is obese.  HENT:     Head:   Cardiovascular:     Rate and Rhythm: Normal rate and regular rhythm.     Heart sounds: Normal heart sounds. No murmur heard.    No friction rub. No gallop.  Pulmonary:     Effort: Pulmonary effort is normal. No respiratory distress.     Breath sounds: Normal breath sounds. No wheezing, rhonchi or rales.  Chest:     Chest wall: No tenderness.  Abdominal:     General: There is no distension.      Tenderness: There is no abdominal tenderness.  Musculoskeletal:     Right lower leg: No edema.     Left lower leg: No edema.  Skin:    Findings: Erythema and rash present. Rash is macular and papular.  Neurological:     Mental Status: He is alert.           Assessment & Plan:  Attention deficit disorder, unspecified type  Pure hypercholesterolemia - Plan: CBC with Differential/Platelet, Lipid panel, Comprehensive metabolic panel with GFR  Seborrheic dermatitis Patient has ADD.  He continues to do well on Vyvanse  30 mg daily.  I will refill this medication for him.  I recommended returning fasting in 1 month to check a CBC a CMP and a fasting lipid panel.  Recommended abstinence from alcohol.  Patient continues to suffer with severe seborrheic dermatitis.  He has tried and failed ketoconazole , hydrocortisone cream, and doxycycline .  He has seen dermatology and their medicine is not helping.  I will try the patient on zoryve  once daily foam.

## 2023-07-26 ENCOUNTER — Other Ambulatory Visit (HOSPITAL_COMMUNITY): Payer: Self-pay

## 2023-07-26 ENCOUNTER — Telehealth: Payer: Self-pay | Admitting: Pharmacy Technician

## 2023-07-26 NOTE — Telephone Encounter (Signed)
 Pharmacy Patient Advocate Encounter  Received notification from [employer] that Prior Authorization for Zoryve  0.3% foam has been APPROVED from 07/26/23 to 07/25/24. Ran test claim, Copay is $0.00. This test claim was processed through [facility]- copay amounts may vary at other pharmacies due to pharmacy/plan contracts, or as the patient moves through the different stages of their insurance plan.   PA #/Case ID/Reference #: [id number]

## 2023-07-26 NOTE — Telephone Encounter (Signed)
 Pharmacy Patient Advocate Encounter   Received notification from [employer] that prior authorization for Zoryve  0.3% foam is required/requested.   Insurance verification completed.   The patient is insured through [facility] .   Per test claim: PA required; PA submitted to above mentioned insurance via CoverMyMeds Key/confirmation #/EOC [id number] Status is pending

## 2023-08-14 ENCOUNTER — Other Ambulatory Visit

## 2023-08-14 ENCOUNTER — Telehealth: Payer: Self-pay | Admitting: Family Medicine

## 2023-08-14 DIAGNOSIS — E7800 Pure hypercholesterolemia, unspecified: Secondary | ICD-10-CM

## 2023-08-14 NOTE — Telephone Encounter (Signed)
 Prescription Request  08/14/2023  LOV: 07/25/2023  What is the name of the medication or equipment?   lisdexamfetamine (VYVANSE ) 30 MG capsule  **patient stated he's out of pills**  Have you contacted your pharmacy to request a refill? Yes   Which pharmacy would you like this sent to?  [id number] [address], [place] - [address] AT [facility] [address] [facility] [address] [place] [address] Phone: [phone] Fax: [phone]    Patient notified that their request is being sent to the clinical staff for review and that they should receive a response within 2 business days.   Please advise pharmacist.

## 2023-08-15 ENCOUNTER — Ambulatory Visit: Payer: Self-pay | Admitting: Family Medicine

## 2023-08-15 ENCOUNTER — Other Ambulatory Visit: Payer: Self-pay | Admitting: Family Medicine

## 2023-08-15 LAB — CBC WITH DIFFERENTIAL/PLATELET
Absolute Lymphocytes: 1218 {cells}/uL (ref 850–3900)
Absolute Monocytes: 516 {cells}/uL (ref 200–950)
Basophils Absolute: 29 {cells}/uL (ref 0–200)
Basophils Relative: 0.5 %
Eosinophils Absolute: 139 {cells}/uL (ref 15–500)
Eosinophils Relative: 2.4 %
HCT: 48.1 % (ref 38.5–50.0)
Hemoglobin: 16.6 g/dL (ref 13.2–17.1)
MCH: 34.3 pg — ABNORMAL HIGH (ref 27.0–33.0)
MCHC: 34.5 g/dL (ref 32.0–36.0)
MCV: 99.4 fL (ref 80.0–100.0)
MPV: 10.9 fL (ref 7.5–12.5)
Monocytes Relative: 8.9 %
Neutro Abs: 3898 {cells}/uL (ref 1500–7800)
Neutrophils Relative %: 67.2 %
Platelets: 174 Thousand/uL (ref 140–400)
RBC: 4.84 Million/uL (ref 4.20–5.80)
RDW: 12.7 % (ref 11.0–15.0)
Total Lymphocyte: 21 %
WBC: 5.8 Thousand/uL (ref 3.8–10.8)

## 2023-08-15 LAB — COMPREHENSIVE METABOLIC PANEL WITH GFR
AG Ratio: 2.1 (calc) (ref 1.0–2.5)
ALT: 76 U/L — ABNORMAL HIGH (ref 9–46)
AST: 32 U/L (ref 10–40)
Albumin: 4.6 g/dL (ref 3.6–5.1)
Alkaline phosphatase (APISO): 89 U/L (ref 36–130)
BUN: 17 mg/dL (ref 7–25)
CO2: 28 mmol/L (ref 20–32)
Calcium: 9.9 mg/dL (ref 8.6–10.3)
Chloride: 106 mmol/L (ref 98–110)
Creat: 1.04 mg/dL (ref 0.60–1.26)
Globulin: 2.2 g/dL (ref 1.9–3.7)
Glucose, Bld: 73 mg/dL (ref 65–99)
Potassium: 4.6 mmol/L (ref 3.5–5.3)
Sodium: 141 mmol/L (ref 135–146)
Total Bilirubin: 0.4 mg/dL (ref 0.2–1.2)
Total Protein: 6.8 g/dL (ref 6.1–8.1)
eGFR: 96 mL/min/1.73m2 (ref >=60)

## 2023-08-15 LAB — LIPID PANEL
Cholesterol: 212 mg/dL — ABNORMAL HIGH (ref <200)
HDL: 56 mg/dL (ref >=40)
LDL Cholesterol (Calc): 132 mg/dL — ABNORMAL HIGH
Non-HDL Cholesterol (Calc): 156 mg/dL — ABNORMAL HIGH (ref <130)
Total CHOL/HDL Ratio: 3.8 (calc) (ref <5.0)
Triglycerides: 129 mg/dL (ref <150)

## 2023-08-15 MED ORDER — LISDEXAMFETAMINE DIMESYLATE 30 MG PO CAPS: 30.0000 mg | ORAL_CAPSULE | Freq: Every day | ORAL | 0 refills | Status: DC

## 2023-11-13 ENCOUNTER — Ambulatory Visit: Admitting: Physician Assistant

## 2023-12-21 ENCOUNTER — Ambulatory Visit: Payer: 59 | Admitting: Family Medicine

## 2023-12-21 ENCOUNTER — Encounter: Payer: Self-pay | Admitting: Family Medicine

## 2023-12-21 VITALS — BP 138/82 | HR 65 | Ht 70.0 in | Wt 237.6 lb

## 2023-12-21 DIAGNOSIS — F988 Other specified behavioral and emotional disorders with onset usually occurring in childhood and adolescence: Secondary | ICD-10-CM

## 2023-12-21 DIAGNOSIS — H9313 Tinnitus, bilateral: Secondary | ICD-10-CM | POA: Insufficient documentation

## 2023-12-21 DIAGNOSIS — Z719 Counseling, unspecified: Secondary | ICD-10-CM | POA: Insufficient documentation

## 2023-12-21 DIAGNOSIS — E7800 Pure hypercholesterolemia, unspecified: Secondary | ICD-10-CM

## 2023-12-21 DIAGNOSIS — M9901 Segmental and somatic dysfunction of cervical region: Secondary | ICD-10-CM | POA: Insufficient documentation

## 2023-12-21 DIAGNOSIS — Z0000 Encounter for general adult medical examination without abnormal findings: Secondary | ICD-10-CM

## 2023-12-21 DIAGNOSIS — Z0110 Encounter for examination of ears and hearing without abnormal findings: Secondary | ICD-10-CM | POA: Insufficient documentation

## 2023-12-21 DIAGNOSIS — Z23 Encounter for immunization: Secondary | ICD-10-CM

## 2023-12-21 DIAGNOSIS — E291 Testicular hypofunction: Secondary | ICD-10-CM

## 2023-12-21 MED ORDER — LISDEXAMFETAMINE DIMESYLATE 30 MG PO CAPS: 30.0000 mg | ORAL_CAPSULE | Freq: Every day | ORAL | 0 refills | Status: DC

## 2023-12-21 NOTE — Addendum Note (Signed)
 Addended by: ANGELENA RONAL BRADLEY K on: 12/21/2023 10:27 AM   Modules accepted: Orders

## 2023-12-21 NOTE — Progress Notes (Signed)
 Subjective:    Patient ID: Eric Hodge, male    DOB: 1985/06/01, 38 y.o.   MRN: [id number]  HPI  Patient is a very pleasant 38 year old Caucasian gentleman who presents today for for complete physical exam.  Past medical history significant for alcohol induced pancreatitis as well as alcoholism.  The patient is abstaining from alcohol.  He would like to resume Vyvanse .  He states that he is having a difficult time at work maintaining focus.  He states that he is easily distracted.  He will start 1 job and then moved to another job without completing the first.  With the medication he is able to focus.  He has a history of hypogonadism.  He has been going to a another clinic where he has been receiving testosterone  cypionate 160 mg every week.  He is also been on an estrogen blocker.  He is due to recheck lab work today.  He is interested in me continuing the prescription for the testosterone  replacement.  He does have a history of hypogonadism however I am concerned that they may be giving him excessive amounts of testosterone .  I explained to the patient that I do not want to see his hemoglobin above 19 and I would like to try to keep his testosterone  level between 400 and 700.  I believe if his testosterone  level is between 400 and 700 he would not have to take an estrogen blocker.  Patient is comfortable with that.  His flu shot is up-to-date.  His tetanus shot is up-to-date.  We discussed HPV vaccination and he would like to receive that Past Medical History:  Diagnosis Date   ADHD (attention deficit hyperactivity disorder)    Alcohol use disorder 09/2021   Elevated LFTs    Hyperlipidemia    Pancreatitis, alcoholic, acute 09/2021   Retained orthopedic hardware 02/2017   right distal radius   Past Surgical History:  Procedure Laterality Date   BALLOON DILATION N/A 03/17/2022   Procedure: BALLOON DILATION;  Surgeon: Wilhelmenia Aloha Raddle., MD;  Location: [facility];  Service:  Gastroenterology;  Laterality: N/A;   BIOPSY  02/17/2022   Procedure: BIOPSY;  Surgeon: Wilhelmenia Aloha Raddle., MD;  Location: [facility];  Service: Gastroenterology;;   ORIN MEDIATE RELEASE Right 10/06/2016   Procedure: CARPAL TUNNEL RELEASE;  Surgeon: Murrell Drivers, MD;  Location: [facility];  Service: Orthopedics;  Laterality: Right;   CYST GASTROSTOMY  03/17/2022   Procedure: CYST GASTROSTOMY;  Surgeon: Wilhelmenia Aloha Raddle., MD;  Location: [facility];  Service: Gastroenterology;;   ESOPHAGOGASTRODUODENOSCOPY (EGD) WITH PROPOFOL  N/A 02/17/2022   Procedure: ESOPHAGOGASTRODUODENOSCOPY (EGD) WITH PROPOFOL ;  Surgeon: Wilhelmenia Aloha Raddle., MD;  Location: [facility];  Service: Gastroenterology;  Laterality: N/A;   ESOPHAGOGASTRODUODENOSCOPY (EGD) WITH PROPOFOL  N/A 03/17/2022   Procedure: ESOPHAGOGASTRODUODENOSCOPY (EGD) WITH PROPOFOL ;  Surgeon: Wilhelmenia Aloha Raddle., MD;  Location: [facility];  Service: Gastroenterology;  Laterality: N/A;   ESOPHAGOGASTRODUODENOSCOPY (EGD) WITH PROPOFOL  N/A 04/28/2022   Procedure: ESOPHAGOGASTRODUODENOSCOPY (EGD) WITH PROPOFOL ;  Surgeon: Wilhelmenia Aloha Raddle., MD;  Location: [facility];  Service: Gastroenterology;  Laterality: N/A;   EUS N/A 02/17/2022   Procedure: UPPER ENDOSCOPIC ULTRASOUND (EUS) RADIAL;  Surgeon: Wilhelmenia Aloha Raddle., MD;  Location: [facility];  Service: Gastroenterology;  Laterality: N/A;   EUS N/A 03/17/2022   Procedure: UPPER ENDOSCOPIC ULTRASOUND (EUS) RADIAL;  Surgeon: Wilhelmenia Aloha Raddle., MD;  Location: [facility];  Service: Gastroenterology;  Laterality: N/A;   FOREIGN BODY REMOVAL  09/08/2011   Procedure: REMOVAL FOREIGN BODY  EXTREMITY;  Surgeon: Franky JONELLE Curia, MD;  Location: [facility];  Service: Orthopedics;  Laterality: Left;  FOREIGN BODY REMOVAL LEFT LONG FINGER   HARDWARE REMOVAL Right 03/10/2017   Procedure: HARDWARE REMOVAL RIGHT WRIST;  Surgeon: Curia Franky, MD;  Location: [facility];  Service: Orthopedics;  Laterality: Right;   MASS EXCISION Left 10/29/2015   Procedure: LEFT WRIST EXCISION MASS;  Surgeon: Franky Curia, MD;  Location: [facility];  Service: Orthopedics;  Laterality: Left;  LEFT WRIST EXCISION MASS   OPEN REDUCTION INTERNAL FIXATION (ORIF) DISTAL RADIAL FRACTURE Right 10/06/2016   Procedure: OPEN REDUCTION INTERNAL FIXATION (ORIF) RIGHT DISTAL RADIUS;  Surgeon: Curia Franky, MD;  Location: [facility];  Service: Orthopedics;  Laterality: Right;   PANCREATIC STENT PLACEMENT  03/17/2022   Procedure: PANCREATIC STENT PLACEMENT;  Surgeon: Wilhelmenia Aloha Raddle., MD;  Location: [facility];  Service: Gastroenterology;;   CLEDA REMOVAL  04/28/2022   Procedure: STENT REMOVAL;  Surgeon: Wilhelmenia Aloha Raddle., MD;  Location: [facility];  Service: Gastroenterology;;   WISDOM TOOTH EXTRACTION  2012   Current Outpatient Medications on File Prior to Visit  Medication Sig Dispense Refill   clotrimazole  (LOTRIMIN ) 1 % cream Apply 1 Application topically 2 (two) times daily. 30 g 0   doxycycline  (VIBRA -TABS) 100 MG tablet Take 1 tablet (100 mg total) by mouth 2 (two) times daily. 60 tablet 0   KETOCONAZOLE , TOPICAL, (NIZORAL ) 1 % SHAM Apply 1 Application topically 2 (two) times a week. 200 mL 11   Multiple Vitamin (MULTIVITAMIN WITH MINERALS) TABS tablet Take 1 tablet by mouth in the morning.     predniSONE  (DELTASONE ) 20 MG tablet 3 tabs poqday 1-2, 2 tabs poqday 3-4, 1 tab poqday 5-6 12 tablet 0   Roflumilast  (ZORYVE ) 0.3 % FOAM Apply 1 Application topically daily. 30 g 11   rosuvastatin  (CRESTOR ) 10 MG tablet Take 1 tablet (10 mg total) by mouth daily. 90 tablet 3   No current facility-administered medications on file prior to visit.   No Known Allergies Social History   Socioeconomic History   Marital status: Divorced    Spouse name: Not on file   Number of children: Not on file   Years of education: Not on file    Highest education level: Some college, no degree  Occupational History   Not on file  Tobacco Use   Smoking status: Every Day    Current packs/day: 0.50    Average packs/day: 0.5 packs/day for 10.0 years (5.0 ttl pk-yrs)    Types: Cigarettes   Smokeless tobacco: Never  Vaping Use   Vaping status: Never Used  Substance and Sexual Activity   Alcohol use: Yes    Alcohol/week: 6.0 standard drinks of alcohol    Types: 6 Shots of liquor per week    Comment: 4-5 days per week   Drug use: No   Sexual activity: Not Currently  Other Topics Concern   Not on file  Social History Narrative   Not on file   Social Drivers of Health   Financial Resource Strain: Low Risk  (12/17/2023)   Overall Financial Resource Strain (CARDIA)    Difficulty of Paying Living Expenses: Not hard at all  Food Insecurity: No Food Insecurity (12/17/2023)   Hunger Vital Sign    Worried About Running Out of Food in the Last Year: Never true    Ran Out of Food in the Last Year: Never true  Transportation Needs: No Transportation  Needs (12/17/2023)   PRAPARE - Administrator, Civil Service (Medical): No    Lack of Transportation (Non-Medical): No  Physical Activity: Insufficiently Active (12/17/2023)   Exercise Vital Sign    Days of Exercise per Week: 1 day    Minutes of Exercise per Session: 20 min  Stress: No Stress Concern Present (12/17/2023)   [employer] of Occupational Health - Occupational Stress Questionnaire    Feeling of Stress: Not at all  Social Connections: Unknown (12/17/2023)   Social Connection and Isolation Panel    Frequency of Communication with Friends and Family: Patient declined    Frequency of Social Gatherings with Friends and Family: Patient declined    Attends Religious Services: Patient declined    Database Administrator or Organizations: Patient declined    Attends Banker Meetings: Not on file    Marital Status: Patient declined  Intimate  Partner Violence: Not on file    Review of Systems     Objective:   Physical Exam Vitals reviewed.  Constitutional:      Appearance: Normal appearance. He is obese.  Cardiovascular:     Rate and Rhythm: Normal rate and regular rhythm.     Heart sounds: Normal heart sounds. No murmur heard.    No friction rub. No gallop.  Pulmonary:     Effort: Pulmonary effort is normal. No respiratory distress.     Breath sounds: Normal breath sounds. No wheezing, rhonchi or rales.  Chest:     Chest wall: No tenderness.  Abdominal:     General: Bowel sounds are normal. There is no distension.     Palpations: Abdomen is soft.     Tenderness: There is no abdominal tenderness. There is no guarding or rebound.  Musculoskeletal:     Right lower leg: No edema.     Left lower leg: No edema.  Neurological:     Mental Status: He is alert.           Assessment & Plan:  Pure hypercholesterolemia - Plan: CBC with Differential/Platelet, Comprehensive metabolic panel with GFR, Lipid panel  Hypogonadism in male - Plan: CBC with Differential/Platelet, Comprehensive metabolic panel with GFR, Lipid panel, PSA, Testosterone  Total,Free,Bio, Males  Attention deficit disorder, unspecified type  General medical exam Check a testosterone  level.  If testosterone  is between 400 and 600 I will maintain him on the 160 mg weekly of testosterone  cypionate.  If greater than 600 I plan to decrease the dose of testosterone .  If hemoglobin is greater than 19 I will plan to decrease the dose of testosterone .  I do not approve of using the estrogen blocker so I would discontinue this.  Resume Vyvanse  30 mg a day for ADHD.  Blood pressure today is acceptable.  The patient received HPV vaccination.  Continue to encourage abstinence from alcohol.  Check CBC CMP lipid panel and PSA.  Check PSA due to the high levels of testosterone  the patient is taking

## 2023-12-22 ENCOUNTER — Other Ambulatory Visit: Payer: Self-pay | Admitting: Family Medicine

## 2023-12-22 ENCOUNTER — Ambulatory Visit: Payer: Self-pay | Admitting: Family Medicine

## 2023-12-22 LAB — LIPID PANEL
Cholesterol: 190 mg/dL (ref <200)
HDL: 42 mg/dL (ref >=40)
LDL Cholesterol (Calc): 123 mg/dL — ABNORMAL HIGH
Non-HDL Cholesterol (Calc): 148 mg/dL — ABNORMAL HIGH (ref <130)
Total CHOL/HDL Ratio: 4.5 (calc) (ref <5.0)
Triglycerides: 139 mg/dL (ref <150)

## 2023-12-22 LAB — CBC WITH DIFFERENTIAL/PLATELET
Absolute Lymphocytes: 1288 {cells}/uL (ref 850–3900)
Absolute Monocytes: 353 {cells}/uL (ref 200–950)
Basophils Absolute: 40 {cells}/uL (ref 0–200)
Basophils Relative: 0.7 %
Eosinophils Absolute: 182 {cells}/uL (ref 15–500)
Eosinophils Relative: 3.2 %
HCT: 48.1 % (ref 39.4–51.1)
Hemoglobin: 17 g/dL (ref 13.2–17.1)
MCH: 34.2 pg — ABNORMAL HIGH (ref 27.0–33.0)
MCHC: 35.3 g/dL (ref 31.6–35.4)
MCV: 96.8 fL (ref 81.4–101.7)
MPV: 10.7 fL (ref 7.5–12.5)
Monocytes Relative: 6.2 %
Neutro Abs: 3836 {cells}/uL (ref 1500–7800)
Neutrophils Relative %: 67.3 %
Platelets: 163 Thousand/uL (ref 140–400)
RBC: 4.97 Million/uL (ref 4.20–5.80)
RDW: 12.6 % (ref 11.0–15.0)
Total Lymphocyte: 22.6 %
WBC: 5.7 Thousand/uL (ref 3.8–10.8)

## 2023-12-22 LAB — COMPREHENSIVE METABOLIC PANEL WITH GFR
AG Ratio: 2.2 (calc) (ref 1.0–2.5)
ALT: 39 U/L (ref 9–46)
AST: 20 U/L (ref 10–40)
Albumin: 4.6 g/dL (ref 3.6–5.1)
Alkaline phosphatase (APISO): 79 U/L (ref 36–130)
BUN: 17 mg/dL (ref 7–25)
CO2: 27 mmol/L (ref 20–32)
Calcium: 9.2 mg/dL (ref 8.6–10.3)
Chloride: 105 mmol/L (ref 98–110)
Creat: 1.15 mg/dL (ref 0.60–1.26)
Globulin: 2.1 g/dL (ref 1.9–3.7)
Glucose, Bld: 109 mg/dL — ABNORMAL HIGH (ref 65–99)
Potassium: 4.5 mmol/L (ref 3.5–5.3)
Sodium: 140 mmol/L (ref 135–146)
Total Bilirubin: 0.5 mg/dL (ref 0.2–1.2)
Total Protein: 6.7 g/dL (ref 6.1–8.1)
eGFR: 85 mL/min/1.73m2 (ref >=60)

## 2023-12-22 LAB — PSA: PSA: 1.01 ng/mL (ref <=4.00)

## 2023-12-22 LAB — TESTOSTERONE TOTAL,FREE,BIO, MALES
Albumin: 4.6 g/dL (ref 3.6–5.1)
Sex Hormone Binding: 16 nmol/L (ref 10–50)
Testosterone, Bioavailable: 296.6 ng/dL (ref 110.0–575.0)
Testosterone, Free: 141.2 pg/mL (ref 46.0–224.0)
Testosterone: 600 ng/dL (ref 250–827)

## 2023-12-22 MED ORDER — TESTOSTERONE CYPIONATE 200 MG/ML IM SOLN: 160.0000 mg | INTRAMUSCULAR | 0 refills | Status: AC

## 2024-01-27 ENCOUNTER — Other Ambulatory Visit: Payer: Self-pay | Admitting: Family Medicine

## 2024-02-15 ENCOUNTER — Other Ambulatory Visit: Payer: Self-pay | Admitting: Family Medicine

## 2024-02-15 ENCOUNTER — Telehealth: Payer: Self-pay | Admitting: Family Medicine

## 2024-02-15 MED ORDER — LISDEXAMFETAMINE DIMESYLATE 30 MG PO CAPS: 30.0000 mg | ORAL_CAPSULE | Freq: Every day | ORAL | 0 refills | Status: AC

## 2024-02-15 NOTE — Telephone Encounter (Signed)
 Prescription Request  02/15/2024  LOV: 12/21/2023  What is the name of the medication or equipment?   lisdexamfetamine  (VYVANSE ) 30 MG capsule  **last dose taken over 10 days ago)  Have you contacted your pharmacy to request a refill? Yes   Which pharmacy would you like this sent to?  [facility] #[id number] [address], [place] - [address] AT [facility] OF [address] & [facility] [address] [place] [address] Phone: [phone] Fax: [phone]    Patient notified that their request is being sent to the clinical staff for review and that they should receive a response within 2 business days.   Please advise patient at [phone].

## 2024-02-22 ENCOUNTER — Ambulatory Visit

## 2024-06-21 ENCOUNTER — Other Ambulatory Visit

## 2024-12-20 ENCOUNTER — Other Ambulatory Visit

## 2024-12-23 ENCOUNTER — Encounter: Admitting: Family Medicine
